# Patient Record
Sex: Male | Born: 1945
Health system: Southern US, Community
[De-identification: ages and names within clinical notes are randomized; demographics above are authoritative.]

## PROBLEM LIST (undated history)

## (undated) DIAGNOSIS — Z8619 Personal history of other infectious and parasitic diseases: Secondary | ICD-10-CM

## (undated) DIAGNOSIS — I6529 Occlusion and stenosis of unspecified carotid artery: Secondary | ICD-10-CM

## (undated) DIAGNOSIS — B029 Zoster without complications: Secondary | ICD-10-CM

## (undated) DIAGNOSIS — I2119 ST elevation (STEMI) myocardial infarction involving other coronary artery of inferior wall: Secondary | ICD-10-CM

## (undated) DIAGNOSIS — C61 Malignant neoplasm of prostate: Secondary | ICD-10-CM

## (undated) DIAGNOSIS — I350 Nonrheumatic aortic (valve) stenosis: Secondary | ICD-10-CM

## (undated) DIAGNOSIS — I739 Peripheral vascular disease, unspecified: Secondary | ICD-10-CM

## (undated) DIAGNOSIS — I10 Essential (primary) hypertension: Secondary | ICD-10-CM

## (undated) DIAGNOSIS — C801 Malignant (primary) neoplasm, unspecified: Secondary | ICD-10-CM

## (undated) DIAGNOSIS — I2510 Atherosclerotic heart disease of native coronary artery without angina pectoris: Secondary | ICD-10-CM

## (undated) DIAGNOSIS — L989 Disorder of the skin and subcutaneous tissue, unspecified: Secondary | ICD-10-CM

## (undated) DIAGNOSIS — E785 Hyperlipidemia, unspecified: Secondary | ICD-10-CM

## (undated) DIAGNOSIS — I6350 Cerebral infarction due to unspecified occlusion or stenosis of unspecified cerebral artery: Secondary | ICD-10-CM

## (undated) DIAGNOSIS — I779 Disorder of arteries and arterioles, unspecified: Secondary | ICD-10-CM

## (undated) DIAGNOSIS — C44321 Squamous cell carcinoma of skin of nose: Secondary | ICD-10-CM

## (undated) HISTORY — PX: MOHS SURGERY: SUR867

## (undated) HISTORY — DX: ST elevation (STEMI) myocardial infarction involving other coronary artery of inferior wall: I21.19

## (undated) HISTORY — DX: Disorder of the skin and subcutaneous tissue, unspecified: L98.9

## (undated) HISTORY — DX: Malignant neoplasm of prostate: C61

## (undated) HISTORY — DX: Cerebral infarction due to unspecified occlusion or stenosis of unspecified cerebral artery: I63.50

## (undated) HISTORY — DX: Malignant (primary) neoplasm, unspecified: C80.1

## (undated) HISTORY — DX: Peripheral vascular disease, unspecified: I73.9

## (undated) HISTORY — DX: Atherosclerotic heart disease of native coronary artery without angina pectoris: I25.10

## (undated) HISTORY — DX: Occlusion and stenosis of unspecified carotid artery: I65.29

## (undated) HISTORY — DX: Hyperlipidemia, unspecified: E78.5

## (undated) HISTORY — DX: Disorder of arteries and arterioles, unspecified: I77.9

## (undated) HISTORY — DX: Essential (primary) hypertension: I10

## (undated) HISTORY — DX: Squamous cell carcinoma of skin of nose: C44.321

## (undated) HISTORY — DX: Nonrheumatic aortic (valve) stenosis: I35.0

## (undated) HISTORY — PX: SKIN BIOPSY: SHX1

## (undated) HISTORY — DX: Personal history of other infectious and parasitic diseases: Z86.19

## (undated) HISTORY — DX: Zoster without complications: B02.9

## (undated) HISTORY — PX: HERNIA REPAIR: SHX51

---

## 1993-04-16 DIAGNOSIS — I2119 ST elevation (STEMI) myocardial infarction involving other coronary artery of inferior wall: Secondary | ICD-10-CM

## 1993-04-16 HISTORY — DX: ST elevation (STEMI) myocardial infarction involving other coronary artery of inferior wall: I21.19

## 2004-12-17 DIAGNOSIS — I6350 Cerebral infarction due to unspecified occlusion or stenosis of unspecified cerebral artery: Secondary | ICD-10-CM

## 2004-12-17 HISTORY — DX: Cerebral infarction due to unspecified occlusion or stenosis of unspecified cerebral artery: I63.50

## 2006-07-05 ENCOUNTER — Ambulatory Visit: Payer: Self-pay | Admitting: Cardiology

## 2006-07-11 ENCOUNTER — Ambulatory Visit: Payer: Self-pay | Admitting: Cardiology

## 2006-08-23 ENCOUNTER — Ambulatory Visit: Payer: Self-pay | Admitting: Cardiology

## 2006-09-11 ENCOUNTER — Ambulatory Visit: Payer: Self-pay | Admitting: Cardiology

## 2006-10-01 ENCOUNTER — Ambulatory Visit: Payer: Self-pay

## 2006-10-24 ENCOUNTER — Ambulatory Visit: Payer: Self-pay | Admitting: Cardiology

## 2007-10-09 ENCOUNTER — Ambulatory Visit: Payer: Self-pay | Admitting: Cardiology

## 2008-03-09 ENCOUNTER — Ambulatory Visit: Payer: Self-pay | Admitting: Cardiology

## 2008-11-04 ENCOUNTER — Ambulatory Visit: Payer: Self-pay | Admitting: Cardiology

## 2008-11-04 ENCOUNTER — Ambulatory Visit: Payer: Self-pay | Admitting: Cardiovascular Disease

## 2008-11-04 LAB — CONVERTED CEMR LAB
AST: 29 units/L (ref 0–37)
CO2: 20 meq/L (ref 19–32)
Chloride: 99 meq/L (ref 96–112)
Glucose, Bld: 107 mg/dL — ABNORMAL HIGH (ref 70–99)
HDL: 65 mg/dL (ref >39)
LDL Cholesterol: 101 mg/dL — ABNORMAL HIGH (ref 0–99)
Sodium: 136 meq/L (ref 135–145)
Total CHOL/HDL Ratio: 2.8
VLDL: 18 mg/dL (ref 0–40)

## 2008-11-09 ENCOUNTER — Ambulatory Visit: Payer: Self-pay | Admitting: Cardiology

## 2009-05-12 DIAGNOSIS — G20 Parkinson's disease: Secondary | ICD-10-CM | POA: Insufficient documentation

## 2009-06-14 ENCOUNTER — Ambulatory Visit: Payer: Self-pay | Admitting: Cardiology

## 2009-06-14 ENCOUNTER — Encounter: Payer: Self-pay | Admitting: Cardiology

## 2009-06-14 DIAGNOSIS — I6529 Occlusion and stenosis of unspecified carotid artery: Secondary | ICD-10-CM | POA: Insufficient documentation

## 2009-07-12 ENCOUNTER — Ambulatory Visit: Payer: Self-pay

## 2009-07-13 ENCOUNTER — Encounter: Payer: Self-pay | Admitting: Cardiology

## 2009-07-19 ENCOUNTER — Encounter: Payer: Self-pay | Admitting: Cardiology

## 2010-02-16 DIAGNOSIS — I10 Essential (primary) hypertension: Secondary | ICD-10-CM | POA: Insufficient documentation

## 2010-02-16 DIAGNOSIS — I252 Old myocardial infarction: Secondary | ICD-10-CM | POA: Insufficient documentation

## 2010-02-16 DIAGNOSIS — I2510 Atherosclerotic heart disease of native coronary artery without angina pectoris: Secondary | ICD-10-CM | POA: Insufficient documentation

## 2010-02-16 DIAGNOSIS — E785 Hyperlipidemia, unspecified: Secondary | ICD-10-CM | POA: Insufficient documentation

## 2010-02-21 ENCOUNTER — Ambulatory Visit: Payer: Self-pay | Admitting: Cardiology

## 2010-02-21 DIAGNOSIS — R635 Abnormal weight gain: Secondary | ICD-10-CM | POA: Insufficient documentation

## 2010-02-27 ENCOUNTER — Encounter: Payer: Self-pay | Admitting: Cardiology

## 2010-04-17 ENCOUNTER — Telehealth (INDEPENDENT_AMBULATORY_CARE_PROVIDER_SITE_OTHER): Payer: Self-pay | Admitting: *Deleted

## 2010-04-20 ENCOUNTER — Ambulatory Visit: Payer: Self-pay | Admitting: Cardiology

## 2010-04-26 LAB — CONVERTED CEMR LAB
ALT: 31 units/L (ref 0–53)
Alkaline Phosphatase: 50 units/L (ref 39–117)
LDL Cholesterol: 111 mg/dL — ABNORMAL HIGH (ref 0–99)
Sodium: 138 meq/L (ref 135–145)
Total Bilirubin: 2.2 mg/dL — ABNORMAL HIGH (ref 0.3–1.2)
Total Protein: 7.4 g/dL (ref 6.0–8.3)
Triglycerides: 78 mg/dL (ref <150)
VLDL: 16 mg/dL (ref 0–40)

## 2010-10-02 ENCOUNTER — Ambulatory Visit: Payer: Self-pay

## 2010-10-02 ENCOUNTER — Ambulatory Visit: Payer: Self-pay | Admitting: Cardiology

## 2010-10-02 DIAGNOSIS — I739 Peripheral vascular disease, unspecified: Secondary | ICD-10-CM | POA: Insufficient documentation

## 2010-10-26 ENCOUNTER — Telehealth (INDEPENDENT_AMBULATORY_CARE_PROVIDER_SITE_OTHER): Payer: Self-pay | Admitting: Radiology

## 2010-10-30 ENCOUNTER — Ambulatory Visit: Payer: Self-pay

## 2010-10-30 ENCOUNTER — Encounter (HOSPITAL_COMMUNITY)
Admission: RE | Admit: 2010-10-30 | Discharge: 2010-12-16 | Payer: Self-pay | Source: Home / Self Care | Attending: Cardiology | Admitting: Cardiology

## 2010-10-30 ENCOUNTER — Encounter: Payer: Self-pay | Admitting: Cardiology

## 2010-10-30 ENCOUNTER — Ambulatory Visit: Payer: Self-pay | Admitting: Cardiology

## 2010-12-21 ENCOUNTER — Encounter: Payer: Self-pay | Admitting: Cardiology

## 2011-01-16 NOTE — Assessment & Plan Note (Signed)
Summary: Cardiology Nuclear Testing  Nuclear Med Background Indications for Stress Test: Evaluation for Ischemia   History: GXT, Heart Catheterization, Myocardial Infarction, Myocardial Perfusion Study  History Comments: '94 MI-IWMI / Cath- no CAD , '07 GXT (-) , '09 MPI - nml  Symptoms: Palpitations    Nuclear Pre-Procedure Cardiac Risk Factors: Carotid Disease, Family History - CAD, Hypertension, Lipids, PVD Caffeine/Decaff Intake: none NPO After: 7:00 PM Lungs: clear IV 0.9% NS with Angio Cath: 22g     IV Site: R Hand IV Started by: Cathlyn Parsons, RN Chest Size (in) 42     Height (in): 69 Weight (lb): 192 BMI: 28.46  Nuclear Med Study 1 or 2 day study:  1 day     Stress Test Type:  Stress Reading MD:  Marca Ancona, MD     Referring MD:  T.Wall Resting Radionuclide:  Technetium 84m Tetrofosmin     Resting Radionuclide Dose:  11.0 mCi  Stress Radionuclide:  Technetium 82m Tetrofosmin     Stress Radionuclide Dose:  33.0 mCi   Stress Protocol Exercise Time (min):  9:00 min     Max HR:  134 bpm     Predicted Max HR:  156 bpm  Max Systolic BP: 166 mm Hg     Percent Max HR:  85.90 %     METS: 10.4 Rate Pressure Product:  60454    Stress Test Technologist:  Milana Na, EMT-P     Nuclear Technologist:  Domenic Polite, CNMT  Rest Procedure  Myocardial perfusion imaging was performed at rest 45 minutes following the intravenous administration of Technetium 53m Tetrofosmin.  Stress Procedure  The patient exercised for 9:00. The patient stopped due to fatigue, sob, and denied any chest pain.  There were no significant ST-T wave changes and rare pacs/pvc.  Technetium 55m Tetrofosmin was injected at peak exercise and myocardial perfusion imaging was performed after a brief delay.  QPS Raw Data Images:  Normal; no motion artifact; normal heart/lung ratio. Stress Images:  Moderate basal to mid inferior perfusion defect.  Rest Images:  Moderate basal to mid inferior  perfusion defect.  Subtraction (SDS):  Fixed moderate basal to mid inferior perfusion defect.  Transient Ischemic Dilatation:  0.95  (Normal <1.22)  Lung/Heart Ratio:  0.25  (Normal <0.45)  Quantitative Gated Spect Images QGS EDV:  119 ml QGS ESV:  48 ml QGS EF:  60 % QGS cine images:  Basal to mid inferior severe hypokinesis.    Overall Impression  Exercise Capacity: Good exercise capacity. BP Response: Normal blood pressure response. Clinical Symptoms: Short of breath, no chest pain.  ECG Impression: No significant ST segment change suggestive of ischemia. Overall Impression: Findings suggestive of prior inferior MI with basal to mid fixed inferior perfusion defect.  No ischemia.  Overall Impression Comments: Preserved EF.   Appended Document: Cardiology Nuclear Testing stable, no change in treatment.  Reviewed Juanito Doom, MD  Appended Document: Cardiology Nuclear Testing Pt aware of results. Mylo Red RN

## 2011-01-16 NOTE — Miscellaneous (Signed)
Summary: Orders Update  Clinical Lists Changes  Orders: Added new Test order of Carotid Duplex (Carotid Duplex) - Signed 

## 2011-01-16 NOTE — Letter (Signed)
Summary: [facility] Office Note  [facility] Office Note   Imported By: Roderic Ovens 03/22/2010 12:38:15  _____________________________________________________________________  External Attachment:    Type:   Image     Comment:   External Document

## 2011-01-16 NOTE — Assessment & Plan Note (Signed)
Summary: 6 MONTH ROV WITH CAROTID U/S SAMEDAY  Medications Added CARBIDOPA-LEVODOPA CR 25-100 MG CR-TABS (CARBIDOPA-LEVODOPA) 1/2 tab six times daily AZILECT 1 MG TABS (RASAGILINE MESYLATE) 1 tab once daily        Visit Type:  6 mo f/u  Primary Provider:  No PCP at this time  CC:  edema/ankles....denies any other complaints today.....Marland Kitchen  History of Present Illness: [name] [name] returns today for further evaluation and management of his multiple cardiac and vascular issues.  He's having no angina or ischemic symptoms. Mobility is limited because of his Parkinson's. Last stress evaluation was in 2009.  He's having no symptoms of TIAs or mini strokes. He is to have carotid Dopplers today.  He has had about 10-12 pound weight gain. He denies orthopnea PND or edema. 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  Blood work reviewed from last visit. Meds reviewed. He is compliant with these.  He denies any claudication.  Clinical Reports Reviewed:  Nuclear Study:  11/04/2008:  This is a negative exercise Myoview for ischemia. There was no evidence for ischemia on the perfusion imaging. There were no ischemic ST segment changes noted on exercise. The patient demonstrated good exercise tolerance.  10/01/2006:  Exercise capacity - Good exercise capacity  Blood Pressure - Normal BP response  Clinical Symptoms - No chest pain or dyspnea  ECG impression - No significant ST segement change suggestive of ischemia  Overall impression -  There is an old small/moderate inferolateral scar with slight peri-infarct ischemia  Carotid Doppler:  07/12/2009:  Impressions: Moderate hetergenous plaque on the right, and mild on the left. 60-79% RICA stenosis, low end of range. 40-59% LICA stenosis.  [name] Bollman, MD   Current Medications (verified): 1)  Aspirin Ec 325 Mg Tbec (Aspirin) .... Take One Tablet By Mouth Daily 2)  Fish Oil 1200 Mg Caps (Omega-3 Fatty Acids) ....  2 By Mouth Daily 3)  Lipitor 80 Mg Tabs (Atorvastatin Calcium) .... Take One Tablet By Mouth Daily. 4)  Amlodipine Besylate 10 Mg Tabs (Amlodipine Besylate) .... Take One Tablet By Mouth Daily 5)  Benazepril Hcl 40 Mg Tabs (Benazepril Hcl) .Marland Kitchen.. 1 By Mouth Once Daily 6)  Requip 3 Mg Tabs (Ropinirole Hcl) .... 1/2 Tab Six Times Daily 7)  Carbidopa-Levodopa Cr 25-100 Mg Cr-Tabs (Carbidopa-Levodopa) .... 1/2 Tab Six Times Daily 8)  Azilect 1 Mg Tabs (Rasagiline Mesylate) .Marland Kitchen.. 1 Tab Once Daily  Allergies: 1)  ! Pcn  Past History:  Past Medical History: Last updated: 02/16/2010 MYOCARDIAL INFARCTION, ACUTE, INFERIOR [name] [name] (ICD-410.40) CVA/ 2006 (ICD-434.91) CAD, NATIVE VESSEL (ICD-414.01) CAROTID ARTERY STENOSIS, WITHOUT INFARCTION (ICD-433.10) HYPERLIPIDEMIA, SEVERE (ICD-272.4) HYPERTENSION (ICD-401.9) PARKINSON'S DISEASE (ICD-332.0)    Family History: Last updated: 05/12/2009 Heart disease - unknown  Social History: Last updated: 05/12/2009 Full Time Married  Tobacco Use - No.  Alcohol Use - yes Regular Exercise - yes Drug Use - no  Risk Factors: Exercise: yes (05/12/2009)  Risk Factors: Smoking Status: never (05/12/2009)  Review of Systems       negative other than history of present illness  Vital Signs:  Patient profile:   65 year old male Height:      69 inches Weight:      197.12 pounds BMI:     29.21 BP sitting:   124 / 80  (right arm) Cuff size:   large  Vitals Entered By: [name] [name], CMA (October 02, 2010 10:24 AM)  Physical Exam  General:  Well developed, well  nourished, in no acute distress. Head:  normocephalic and atraumatic Eyes:  PERRLA/EOM intact; conjunctiva and lids normal. Neck:  Neck supple, no JVD. No masses, thyromegaly or abnormal cervical nodes. Chest [name] [name]:  no deformities or breast masses noted Lungs:  Clear bilaterally to auscultation and percussion. Heart:  PMI not displaced, normal S1-S2, no gallop. Systolic murmur at the apex  at the left sternal border. S2 splits. No diastolic murmur. Carotid bruits bilaterally right greater than left. Abdomen:  positive bowel sounds, no midline bruit, midline hernia, no obvious organomegaly or Msk:  decreased ROM.  Parkinsonian features Pulses:  diminished in the left lower extremity with trace dorsalis pedis 1+ posterior tibial Extremities:  no tissue breakdown or ulcerations. Trace edema. Neurologic:  alert oriented x3, Parkinson's features Skin:  Intact without lesions or rashes. Psych:  Normal affect.   Impression & Recommendations:  Problem # 1:  CAD, NATIVE VESSEL (ICD-414.01)  I will arrange a followup exercise Myoview for risk stratification. Last study 2009. His updated medication list for this problem includes:    Aspirin Ec 325 Mg Tbec (Aspirin) .Marland Kitchen... Take one tablet by mouth daily    Amlodipine Besylate 10 Mg Tabs (Amlodipine besylate) .Marland Kitchen... Take one tablet by mouth daily    Benazepril Hcl 40 Mg Tabs (Benazepril hcl) .Marland Kitchen... 1 by mouth once daily  His updated medication list for this problem includes:    Aspirin Ec 325 Mg Tbec (Aspirin) .Marland Kitchen... Take one tablet by mouth daily    Amlodipine Besylate 10 Mg Tabs (Amlodipine besylate) .Marland Kitchen... Take one tablet by mouth daily    Benazepril Hcl 40 Mg Tabs (Benazepril hcl) .Marland Kitchen... 1 by mouth once daily  Orders: EKG w/ Interpretation (93000) Nuclear Stress Test (Nuc Stress Test)  Problem # 2:  MYOCARDIAL INFARCTION, ACUTE, INFERIOR [name] [name] (ICD-410.40) Assessment: Unchanged  His updated medication list for this problem includes:    Aspirin Ec 325 Mg Tbec (Aspirin) .Marland Kitchen... Take one tablet by mouth daily    Amlodipine Besylate 10 Mg Tabs (Amlodipine besylate) .Marland Kitchen... Take one tablet by mouth daily    Benazepril Hcl 40 Mg Tabs (Benazepril hcl) .Marland Kitchen... 1 by mouth once daily  His updated medication list for this problem includes:    Aspirin Ec 325 Mg Tbec (Aspirin) .Marland Kitchen... Take one tablet by mouth daily    Amlodipine Besylate 10 Mg Tabs  (Amlodipine besylate) .Marland Kitchen... Take one tablet by mouth daily    Benazepril Hcl 40 Mg Tabs (Benazepril hcl) .Marland Kitchen... 1 by mouth once daily  Problem # 3:  CVA/ 2006 (ICD-434.91) Assessment: Unchanged carotid Dopplers today His updated medication list for this problem includes:    Aspirin Ec 325 Mg Tbec (Aspirin) .Marland Kitchen... Take one tablet by mouth daily  Problem # 4:  WEIGHT GAIN (ICD-783.1) Assessment: Deteriorated advised to lose 10 pounds.  Problem # 5:  PVD (ICD-443.9) Assessment: New pulses diminished but present in the left lower extremity. Asymptomatic. Observe. Patient advised about claudication symptoms  Problem # 6:  CAROTID ARTERY STENOSIS, WITHOUT INFARCTION (ICD-433.10) Dopplers today His updated medication list for this problem includes:    Aspirin Ec 325 Mg Tbec (Aspirin) .Marland Kitchen... Take one tablet by mouth daily  Problem # 7:  HYPERTENSION (ICD-401.9) Assessment: Improved no change in meds His updated medication list for this problem includes:    Aspirin Ec 325 Mg Tbec (Aspirin) .Marland Kitchen... Take one tablet by mouth daily    Amlodipine Besylate 10 Mg Tabs (Amlodipine besylate) .Marland Kitchen... Take one tablet by mouth daily    Benazepril  Hcl 40 Mg Tabs (Benazepril hcl) .Marland Kitchen... 1 by mouth once daily  Problem # 8:  HYPERLIPIDEMIA, SEVERE (ICD-272.4) LDL is not at goal but HDL is very high. Doubt benefit from adding another drug at this point. His updated medication list for this problem includes:    Lipitor 80 Mg Tabs (Atorvastatin calcium) .Marland Kitchen... Take one tablet by mouth daily.  Orders: EKG w/ Interpretation (93000) Nuclear Stress Test (Nuc Stress Test)  Patient Instructions: 1)  Your physician recommends that you schedule a follow-up appointment in: 6 months with Dr. Daleen Squibb 2)  Your physician recommends that you continue on your current medications as directed. Please refer to the Current Medication list given to you today. 3)  Your physician has requested that you have an exercise stress myoview.   For further information please visit [email or web address]  Please follow instruction sheet, as given.

## 2011-01-16 NOTE — Assessment & Plan Note (Signed)
Summary: rov/ gd  Medications Added REQUIP 3 MG TABS (ROPINIROLE HCL) 1/2 tab six times daily        Visit Type:  rov Primary Provider:  No PCP at this time  CC:  pt states he is changing PCP .Marland Kitchenno cardiac complaints today.  History of Present Illness: [name] [name] comes in today for further management his coronary artery disease, history of an inferior Dawan Farney infarct, overall normal left ventricular function, carotid artery disease with a history of a stroke, hypertension, mixed hyperlipidemia.  He is having no symptoms of angina or ischemic equivalence. He has been much less active because of worsening of his Parkinson's. He is scheduled to see Dr. Sandria Manly and late May.  Carotid Dopplers in July 2010 showed 60-79% right internal carotid artery stenosis, 40-59% on the left. He had good antegrade flow in both vertebrals. He is having no symptoms of mini strokes or TIAs.  He seems to be compliant with his medications. His weight is up from being less active.  Current Medications (verified): 1)  Aspirin Ec 325 Mg Tbec (Aspirin) .... Take One Tablet By Mouth Daily 2)  Fish Oil 1200 Mg Caps (Omega-3 Fatty Acids) .... 2 By Mouth Daily 3)  Lipitor 80 Mg Tabs (Atorvastatin Calcium) .... Take One Tablet By Mouth Daily. 4)  Amlodipine Besylate 10 Mg Tabs (Amlodipine Besylate) .... Take One Tablet By Mouth Daily 5)  Benazepril Hcl 40 Mg Tabs (Benazepril Hcl) .Marland Kitchen.. 1 By Mouth Once Daily 6)  Requip 3 Mg Tabs (Ropinirole Hcl) .... 1/2 Tab Six Times Daily  Allergies: 1)  ! Pcn  Past History:  Past Medical History: Last updated: 02/16/2010 MYOCARDIAL INFARCTION, ACUTE, INFERIOR Evva Din (ICD-410.40) CVA/ 2006 (ICD-434.91) CAD, NATIVE VESSEL (ICD-414.01) CAROTID ARTERY STENOSIS, WITHOUT INFARCTION (ICD-433.10) HYPERLIPIDEMIA, SEVERE (ICD-272.4) HYPERTENSION (ICD-401.9) PARKINSON'S DISEASE (ICD-332.0)    Family History: Last updated: 05/12/2009 Heart disease - unknown  Social History: Last updated:  05/12/2009 Full Time Married  Tobacco Use - No.  Alcohol Use - yes Regular Exercise - yes Drug Use - no  Risk Factors: Exercise: yes (05/12/2009)  Risk Factors: Smoking Status: never (05/12/2009)  Review of Systems       negative other than history of present illness  Vital Signs:  Patient profile:   65 year old male Height:      69 inches Weight:      195 pounds BMI:     28.90 Pulse rate:   80 / minute Pulse rhythm:   irregular BP sitting:   132 / 80  (right arm) Cuff size:   large  Vitals Entered By: Danielle Rankin, CMA (February 21, 2010 10:29 AM)  Physical Exam  General:  ruddy complexion which is baseline, no acute distress, obvious resting tremor left upper extremity worse in left lower extremity. Head:  normocephalic and atraumatic Eyes:  PERRLA/EOM intact; conjunctiva and lids normal. Neck:  Neck supple, no JVD. No masses, thyromegaly or abnormal cervical nodes. Chest Miriah Maruyama:  no deformities or breast masses noted Lungs:  Clear bilaterally to auscultation and percussion. Heart:  nondisplaced PMI, normal S1-S2, no gallop or murmur. Abdomen:  Bowel sounds positive; abdomen soft and non-tender without masses, organomegaly, or hernias noted. No hepatosplenomegaly. Msk:  decreased ROM.  decreased ROM.   Pulses:  pulses normal in all 4 extremities Extremities:  No clubbing or cyanosis. Neurologic:  Alert and oriented x 3. Parkinson's has clearly worsened Skin:  Intact without lesions or rashes. Psych:  depressed affect.  depressed affect.  Impression & Recommendations:  Problem # 1:  CAD, NATIVE VESSEL (ICD-414.01) Assessment Unchanged  His updated medication list for this problem includes:    Aspirin Ec 325 Mg Tbec (Aspirin) .Marland Kitchen... Take one tablet by mouth daily    Amlodipine Besylate 10 Mg Tabs (Amlodipine besylate) .Marland Kitchen... Take one tablet by mouth daily    Benazepril Hcl 40 Mg Tabs (Benazepril hcl) .Marland Kitchen... 1 by mouth once daily  His updated medication list for  this problem includes:    Aspirin Ec 325 Mg Tbec (Aspirin) .Marland Kitchen... Take one tablet by mouth daily    Amlodipine Besylate 10 Mg Tabs (Amlodipine besylate) .Marland Kitchen... Take one tablet by mouth daily    Benazepril Hcl 40 Mg Tabs (Benazepril hcl) .Marland Kitchen... 1 by mouth once daily  Orders: EKG w/ Interpretation (93000)  Problem # 2:  MYOCARDIAL INFARCTION, ACUTE, INFERIOR Aidin Doane (ICD-410.40) Assessment: Unchanged  His updated medication list for this problem includes:    Aspirin Ec 325 Mg Tbec (Aspirin) .Marland Kitchen... Take one tablet by mouth daily    Amlodipine Besylate 10 Mg Tabs (Amlodipine besylate) .Marland Kitchen... Take one tablet by mouth daily    Benazepril Hcl 40 Mg Tabs (Benazepril hcl) .Marland Kitchen... 1 by mouth once daily  His updated medication list for this problem includes:    Aspirin Ec 325 Mg Tbec (Aspirin) .Marland Kitchen... Take one tablet by mouth daily    Amlodipine Besylate 10 Mg Tabs (Amlodipine besylate) .Marland Kitchen... Take one tablet by mouth daily    Benazepril Hcl 40 Mg Tabs (Benazepril hcl) .Marland Kitchen... 1 by mouth once daily  Problem # 3:  CVA/ 2006 (ICD-434.91) Assessment: Unchanged  His updated medication list for this problem includes:    Aspirin Ec 325 Mg Tbec (Aspirin) .Marland Kitchen... Take one tablet by mouth daily  His updated medication list for this problem includes:    Aspirin Ec 325 Mg Tbec (Aspirin) .Marland Kitchen... Take one tablet by mouth daily  Problem # 4:  CAROTID ARTERY STENOSIS, WITHOUT INFARCTION (ICD-433.10) Assessment: Unchanged Carotids reviewed from July 2010. We will repeat those in 6 months. He is asymptomatic. His updated medication list for this problem includes:    Aspirin Ec 325 Mg Tbec (Aspirin) .Marland Kitchen... Take one tablet by mouth daily  Orders: Carotid Duplex (Carotid Duplex)  Problem # 5:  HYPERLIPIDEMIA, SEVERE (ICD-272.4) He is due fasting lipids and a comprehensive metabolic panel. We will also check a hemoglobin A1c with his recent weight gain. His updated medication list for this problem includes:    Lipitor 80 Mg  Tabs (Atorvastatin calcium) .Marland Kitchen... Take one tablet by mouth daily.  Problem # 6:  HYPERTENSION (ICD-401.9) Assessment: Improved  His updated medication list for this problem includes:    Aspirin Ec 325 Mg Tbec (Aspirin) .Marland Kitchen... Take one tablet by mouth daily    Amlodipine Besylate 10 Mg Tabs (Amlodipine besylate) .Marland Kitchen... Take one tablet by mouth daily    Benazepril Hcl 40 Mg Tabs (Benazepril hcl) .Marland Kitchen... 1 by mouth once daily    His updated medication list for this problem includes:    Aspirin Ec 325 Mg Tbec (Aspirin) .Marland Kitchen... Take one tablet by mouth daily    Amlodipine Besylate 10 Mg Tabs (Amlodipine besylate) .Marland Kitchen... Take one tablet by mouth daily    Benazepril Hcl 40 Mg Tabs (Benazepril hcl) .Marland Kitchen... 1 by mouth once daily  Problem # 7:  PARKINSON'S DISEASE (ICD-332.0) Assessment: Deteriorated This has clearly worsened. I have called Dr. Sandria Manly of neurology. He will see him sooner than his scheduled appointment in May.  Patient  Instructions: 1)  Your physician recommends that you schedule a follow-up appointment in: 6 MONTHS WITH DR Susy Placzek CAROTID SAME DAY 2)  Your physician recommends that you return for lab work ZO:XWRUE BMET LIPID LIVER HGBA1C 3)  Your physician recommends that you continue on your current medications as directed. Please refer to the Current Medication list given to you today. 4)  Your physician has requested that you have a carotid duplex. This test is an ultrasound of the carotid arteries in your neck. It looks at blood flow through these arteries that supply the brain with blood. Allow one hour for this exam. There are no restrictions or special instructions.

## 2011-01-16 NOTE — Progress Notes (Signed)
Summary: nuc pre-procedure  Phone Note Outgoing Call   Call placed by: Domenic Polite, CNMT,  October 26, 2010 2:40 PM Call placed to: Patient Summary of Call: Left message with information on Myoview Information Sheet (see scanned document for details).  Initial call taken by: Domenic Polite, CNMT,  October 26, 2010 2:40 PM     Nuclear Med Background Indications for Stress Test: Evaluation for Ischemia   History: GXT, Heart Catheterization, Myocardial Infarction, Myocardial Perfusion Study  History Comments: '94 MI-IWMI / Cath- no CAD , '07 GXT (-) , '09 MPI - nml     Nuclear Pre-Procedure Cardiac Risk Factors: Carotid Disease, Family History - CAD, Hypertension, Lipids, PVD Height (in): 69

## 2011-01-16 NOTE — Progress Notes (Signed)
   Phone Note Outgoing Call   Call placed by: Scherrie Bateman, LPN,  Apr 18, 4539 4:34 PM Call placed to: Patient'S WIFE Summary of Call: LEFT WORD  WITH WIFE PT NEEDS LABS. SCHEDULED FOR THURS.5//5/11 AT 8:30 Initial call taken by: Scherrie Bateman, LPN,  Apr 17, 9810 4:36 PM  Follow-up for Phone Call        LABS  DONE AS SCHEDULED. Follow-up by: Scherrie Bateman, LPN,  Apr 26, 2010 9:09 AM

## 2011-01-18 NOTE — Letter (Signed)
Summary: [facility] Office Visit Note  [facility] Office Visit Note   Imported By: Roderic Ovens 01/08/2011 14:32:52  _____________________________________________________________________  External Attachment:    Type:   Image     Comment:   External Document

## 2011-05-01 NOTE — Assessment & Plan Note (Signed)
[facility] OFFICE NOTE   NAME:Venier, KIMOTHY KISHIMOTO                         MRN:          [id number]  DATE:03/09/2008                            DOB:          [birth year]    Maxmilian comes in today because of increased tremor on his left side.  This  is the part of his body that was affected by a stroke in 2006.  He has  had some slight rigidity and intention tremor there in the past, but has  been able to work through it and play golf.   His cardiac situation is stable.  He is due a stress test and blood work  in October.   MEDICATIONS:  1. Lipitor 80 mg a day.  2. Aspirin 325 mg a day.  3. Lotrel 10/40 daily.  4. Omega-3 fatty acids.   He has had a very good response to diet and to his medications in  regards to his mixed hyperlipidemia.   PHYSICAL EXAMINATION:  VITAL SIGNS:  Blood pressure today is 126/72, his  pulse is 80 and regular, his weight is 185.  HEENT:  Unchanged.  HEART:  Regular rate and rhythm.  No gallop.  LUNGS:  Clear.  ABDOMEN:  Soft with good bowel sounds.  EXTREMITIES:  No edema.  He has some slight rigidity of the left upper  extremity and lower extremity.  He has a resting tremor that is quite  obvious in his left hand and less obvious in his left foot.  With  intentional movements, it is clearly worse.   ELECTROCARDIOGRAM:  EKG today is normal.   I put a call in to Dr. Melbourne Abts of [facility].  He is concerned  that he may have a hemiparetic Parkinson's syndrome.  This is very rare,  but that is what Rosanne Ashing is thinking.  He would like to evaluate him in his  office.  Will make arrangements.   FOLLOWUP:  I will plan on seeing Izen back in October.  At that time, he  will need fasting blood work and an exercise rest stress Myoview.     Thomas C. Daleen Squibb, MD, [facility]  Electronically Signed   TCW/MedQ  DD: 03/09/2008  DT: 03/09/2008  Job #: [id number]   cc:   Evie Lacks, MD

## 2011-05-01 NOTE — Assessment & Plan Note (Signed)
[facility] OFFICE NOTE   NAME:[name] [name]                         MRN:          [id number]  DATE:11/09/2008                            DOB:          07-08-1946    [name] [name] comes in today for followup.   He has been doing very well without any symptoms of ischemia or ischemic  equivalence.  His Parkinson syndrome is responding to ropinirole that  Dr. Sandria [name] is prescribing.  He is still teaching off.   His weight is up about 8-10 pounds and his wife who comes with him today  says he really needs to work on his diet.  He seems to be very compliant  with his medications, however.   MEDICATIONS:  1. Omega-3 1200 mg b.i.d.  2. Aspirin 325 mg a day.  3. Lipitor 80 mg a day.  4. Amlodipine 10 mg a day.  5. Ropinirole 2 mg t.i.d.  6. Benazepril 40 mg a day.   For his problem list, please refer to my previous notes.   PHYSICAL EXAMINATION:  VITAL SIGNS:  His blood pressure is 136/76, his  heart rate is 75 and regular, and his weight 193, up 8.  HEENT:  Unchanged.  Carotids are full.  Thyroid is not enlarged.  LUNGS:  Clear.  HEART:  Reveals a regular rate and rhythm.  EXTREMITIES:  Revealed no cyanosis, clubbing, or edema.  Pulses are  intact.   Stress Myoview showed him to exercise for 9-1/2 minutes reaching a peak  heart rate of 143 beats per minute.  This is 90% of predicted maximum  heart rate.  His blood pressure response was appropriate.  MET level 2  was 10.7.  There were no ischemic ST segment changes.   His Myoview images showed an EF of 71% with a subtle hypokinesia in the  inferior wall.  He had some scarring areas in the inferior and inferior  septal wall which are old.  There was no evidence of ischemia.   LABORATORY DATA:  Demonstrates a total cholesterol of 184; his  triglycerides were 91; HDL was increased to 65, recently 44; LDL is 101,  recently 212; and VLDL is 18.  Fasting blood sugar was  slightly elevated  at 107.  LFTs were normal except for a total bilirubin of 1.9.   EKG is stable.   I had a long talk with [name] [name] and his wife.  He clearly needs to  reduce his weight, to avoid glucose intolerance and diabetes, otherwise  his numbers look good.  I have advised him to lose about 8-10 pounds  over the next 6 months.  We will see him back at that time.     [name] C. Daleen Squibb, MD, [facility]  Electronically Signed    TCW/MedQ  DD: 11/09/2008  DT: 11/10/2008  Job #: [id number]

## 2011-05-01 NOTE — Assessment & Plan Note (Signed)
[facility] OFFICE NOTE   NAME:LEWISWalden, Statz                         MRN:          [id number]  DATE:10/09/2007                            DOB:          04-16-1946    Mr. Strnad returns today for follow up of the following issues:   1. Coronary artery disease, history of inferior wall infarct. He is      having no angina. His last stress Myoview was October 01, 2006 with      excellent exercise tolerance, EF 56%, minimal reversibility      inferior lateral wall with some scar.  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  2. Severe hyperlipidemia with excellent response to Lipitor and Omega-      3 fatty acids.  His total cholesterol is currently 187 down from      over 300, triglycerides are 102, HDL is increased from 44 to 63      with an additional 6 pounds of weight loss.  VLDL is normal, LDL is      104.  His LFTs are normal except for a slight increase in his      bilirubin which is probably Gilbert's syndrome.  3. Hypertension - under good control with Lotrel 10/40 daily.  He      checks at home on a regular basis, it is usually running around      120/80.  4. He has also had a stroke affecting the left side of his body.      Please refer to previous notes.   EXAMINATION:  VITAL SIGNS:  His blood pressure today was actually up a  little bit at 140/88.  He is a bit nervous.  He has a resting tremor in  his left upper extremity which is a residual of his stroke.  He says it  has not worsened.  His heart rate is 70 and regular.  His weight is 184,  down 6.  GENERAL:  Ruddy complexion which is baseline.  HEENT:  Otherwise unchanged.  NECK:  Carotid upstrokes were equal bilaterally without bruits, thyroid  is not enlarged, trachea is midline.  LUNGS:  Clear.  HEART:  Reveals a nondisplaced PMI, has a normal S1, S2 without murmur,  gallop or rub.  ABDOMEN:  Soft, no midline  bruit.  EXTREMITIES:  Reveal no clubbing, cyanosis or edema.  Pulses are intact.  NEURO EXAM:  Intact except for the left-sided residual changes.   EKG shows an old inferior wall infarct, sinus rhythm, no change from  before.   ASSESSMENT AND PLAN:  I am pleased with how Mr. Millican is doing.  I  thought at one point of adding Zetia to lower his LDL further however,  with the ambivalence concerning Zetia at the present time, not to  mention the fact that his total cholesterol and HDL ratio is less than  3, I have chosen not to do so.  I have made no changes in his medical  program.  If his tremor worsens we could consider perhaps beta blockade.  He says with tension however it is bad.  He may need neurological  consultation down the road.   I will see him back in a year.  At that time we will do a stress  Myoview.     Thomas C. Daleen Squibb, MD, [facility]  Electronically Signed    TCW/MedQ  DD: 10/09/2007  DT: 10/09/2007  Job #: [id number]

## 2011-05-04 NOTE — Assessment & Plan Note (Signed)
[facility]                              CARDIOLOGY OFFICE NOTE   [name] [name]                           MRN:          [id number]  DATE:07/11/2006                            DOB:          Oct 26, 1946    [name] [name] follows up today for blood pressure, as well as lab results.   Please see my note from 07/05/2006.   He is on Lotrel 1040 and aspirin 325 a day.  He is still taking his fish oil  and his red yeast rice.   His blood pressure have been remarkably better.  Today, it is 140/89, down  from 205/117.  Heart rate is 83.  He looks remarkably better and much less  plethora.  His wife even notices the difference.   His labs showed a cholesterol of 312, LDL of 218, HDL of 54, triglycerides  of 155.  His comprehensive metabolic panel is normal.  CBC is normal.  PSA  and TSH are normal.   The rest of the exam is unchanged.   PLAN:  1.  Lipitor 80 mg a day for reducing his LDL as low as possible.  Our goal      is 70 or less.  We may need to add Zetia 10.  This is both for      cardiovascular and cerebrovascular risk reduction.  2.  Followup blood work in six weeks with  a comprehensive metabolic panel      and lipid panel.  3.  See me back in 8 weeks.                               [name] C. Daleen Squibb, MD, [facility]    TCW/MedQ  DD:  07/11/2006  DT:  07/11/2006  Job #:  [id number]

## 2011-05-04 NOTE — Assessment & Plan Note (Signed)
[facility] OFFICE NOTE   NAME:[name], [name] SPIRITO                         MRN:          [id number]  DATE:10/24/2006                            DOB:          08-21-1946    [name] [name] comes in today to discuss the findings of his exercise stress Myoview  study.   He exercised for a total of 11 minutes reaching 13.7 METS. There were no ST  segment changes. He had some PVCs and PACs.   His EF is 56%, with decreased thickening of the inferior wall. Perfusion  scan demonstrated an old small moderate inferior lateral wall scar with very  minimal peri-infarct ischemia.   His lipids are at goal. He has had an excellent response to Lipitor 80. His  blood pressure is also at goal. He is exercising on a regular basis.   PHYSICAL EXAMINATION:  Blood pressure is 130/80, pulse is 76 and regular,  weight is 190. Rest of examination is unchanged.   ASSESSMENT/PLAN:  [name] [name] is doing remarkably well with secondary  preventative therapy. His stress nuclear study is low risk. I see no reason  for catheterization at this point.   He will continue with his current program. I will plan on seeing him back in  September 2008, for blood work and also in October for followup.     Thomas C. Daleen Squibb, MD, [facility]  Electronically Signed    TCW/MedQ  DD: 10/24/2006  DT: 10/24/2006  Job #: [id number]

## 2011-05-04 NOTE — Assessment & Plan Note (Signed)
[facility]                              CARDIOLOGY OFFICE NOTE   [name] [name], [name] [name]                           MRN:          [id number]  DATE:09/11/2006                            DOB:          1946-11-20    [name] [name] comes in today for followup of his coronary artery disease,  hypertension, and hyperlipidemia.   He has become extremely compliant.  He is exercising, sometimes 2 times a  day walking on his treadmill.  He has lost about 9 pounds of weight.  His  blood pressure has been under good control and he seems very compliant with  his medications.  His lipid status was remarkably improved on Lipitor 80,  going from 312 total to 155.  HDL is still acceptable at 44.3 and LDL is  down from 218 to 96.  LFTs were okay.   EXAM:  His blood pressure is 135/81.  His pulse is 86 and regular.  His skin color looks much better.  Carotids are full without bruits.  There is no JVD.  Thyroid is not  enlarged.  Trachea is midline.  HEART:  Regular rate and rhythm without murmur, rub, or gallop.  EXTREMITIES:  No edema.  Pulses are intact.   ASSESSMENT AND PLAN:  [name] [name] is doing remarkably well.  His relative risk for  future stroke or heart attack has been dramatically reduced by secondary  prevention measures, including him buying into being healthy.  His wife is  extremely supportive, as always.   Now that his blood pressure is under control and he is doing better we will  set him up for an exercise rest/stress Myoview in October.  If this is  negative for ischemia, we will continue current treatment and see him back  in 6 months.  Asked him to stop his red yeast rice since he is on Lipitor.  He will continue Lotrel 10/40 and aspirin 325 a day, and fish oil.  I will  not add Zetia at present because of financial reasons.       Thomas C. Daleen Squibb, MD, [facility]     TCW/MedQ  DD:  09/11/2006  DT:  09/12/2006  Job #:  [id number]

## 2011-05-04 NOTE — Assessment & Plan Note (Signed)
[facility]                              CARDIOLOGY OFFICE NOTE   COHEN, BOETTNER                           MRN:          [id number]  DATE:07/05/2006                            DOB:          1946-04-17    This is a new patient evaluation.   [name] is a 65 year old married white male, self-referred from  friends in [place].   [name] [name] experienced a heart attack on May 03, 1993 while playing golf down  in [place].  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   He was told at that time that a small vessel on the back of his heart was  blocked. There was nothing to do.   He has had no further cardiovascular events since then.  However, right  before Christmas this past year, he and his wife noticed that he was having  difficulty using his left arm and left leg.  He has now developed some  intention tremor and some rigidity there.  His strength is good.  His still  able to play golf.   He has not been to a physician in nine years.  He did have a treadmill study  at [facility] with Dr. Cindra Presume in 1998.  At that time he had  excellent exercise tolerance.  EKG could not be interpreted because of  previous Q waves but he had no new ST segment changes.  His blood pressure  response was hyperdynamic, increasing to 208/90.   PAST MEDICAL HISTORY:  In addition to the above, he is intolerant of  PENICILLIN.  He is not allergic to dye.   MEDICATIONS:  1.  He is currently taking omega-3 1200 b.i.d.  2.  Vitamin B complex.  3.  Vitamin C 1000 mg a day.  4.  Vitamin E 400 mg a day.  5.  Folic acid 800 mcg a day.  6.  Aspirin 81 mg b.i.d.  7.  Red yeast rice b.i.d.   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  He seems  extremely active.   FAMILY HISTORY:  His father had a heart attack and died at age 70.   SOCIAL HISTORY:  He has  three children.  He is married and lives in  [place].  He dropped his insurance this past year.  His wife is here  today and very loving and concerned about him.  She teaches elementary and  has insurance.  She can get him on her plan in September.   REVIEW OF SYSTEMS:  Negative other than a history of skin cancer.   PHYSICAL EXAMINATION:  GENERAL:  He is very pleasant.  VITAL SIGNS:  Blood pressure 205/117.  Pulse 82 and regular.  EKG shows  sinus rhythm with an old inferior wall infarct.  His weight is 199.  HEENT: He has a very ruddy complexion with spider veins in his face.  His  face on the  left is slightly flat.  It does not come up normally with a  smile.  PERRLA.  Extraocular movements intact.  Sclerae slightly injected.  Dentition is satisfactory.  NECK:  Carotids are full without bruits.  There is no JVD.  Thyroid is not  enlarged.  Trachea midline.  LUNGS:  Clear.  HEART:  Regular rate and rhythm with normal S1 and S2.  ABDOMEN:  Soft.  No midline bruit.  No hepatomegaly.  EXTREMITIES:  There is no cyanosis, clubbing or edema.  Pulses were present.  NEUROLOGIC:  Intention tremor on the left upper extremity.  He also has a  resting tremor in the left lower extremity.  His muscle tone is 5/5.  His  reflexes are increased.  He has normal hand grip.  His facial expression is  diminished on the left secondary to the stroke.   I failed to mention that he also drinks four glasses of wine a day.  He does  not smoke.   ASSESSMENT:  1.  History of arteriosclerotic coronary artery disease with an inferior      wall infarct in 1984.  He has been treated medically but has not been      compliant with his medications.  He is well aware of this as we      discussed today.  2.  Stroke sometime in late 2006.  He is blessed in that he has good      strength but has an intention tremor as well as some rigidity.  He is      still functional.  3.  Severe hypertension which is not being  treated.  4.  Unknown lipid status.  5.  Premature family history of coronary artery disease.   I have had a long talk, greater than 30 minutes, with [name] and his  wife.  He seems to be serious about his health and she is obviously  supportive.  He has agreed to do what we ask him to do and after September,  we can obtain some diagnostic studies.  There seems to be no urgency to this  at present.   PLAN:  1.  Increase aspirin to 325 a day.  2.  Discontinue alternative medications.  3.  Check baseline CMP, TSH, CBC, PSA, and lipid panel.  4.  Lotrel 10/40 once a day.  Samples given.  5.  Follow up with me next week to review blood work and follow up on blood      pressure.  He will check his blood pressure for [place] at home.                               Thomas C. Daleen Squibb, MD, [facility]    TCW/MedQ  DD:  07/05/2006  DT:  07/05/2006  Job #:  [id number]

## 2011-05-28 ENCOUNTER — Encounter: Payer: Self-pay | Admitting: Cardiology

## 2011-10-16 ENCOUNTER — Encounter: Payer: Self-pay | Admitting: *Deleted

## 2012-01-17 ENCOUNTER — Other Ambulatory Visit: Payer: Self-pay | Admitting: Cardiology

## 2012-01-30 ENCOUNTER — Other Ambulatory Visit: Payer: Self-pay | Admitting: Cardiology

## 2012-02-12 ENCOUNTER — Other Ambulatory Visit: Payer: Self-pay | Admitting: *Deleted

## 2012-02-12 MED ORDER — ATORVASTATIN CALCIUM 80 MG PO TABS: 80.0000 mg | ORAL_TABLET | Freq: Every day | ORAL | Status: DC

## 2012-02-12 MED ORDER — BENAZEPRIL HCL 40 MG PO TABS: 40.0000 mg | ORAL_TABLET | Freq: Every day | ORAL | Status: DC

## 2012-02-13 ENCOUNTER — Other Ambulatory Visit: Payer: Self-pay | Admitting: *Deleted

## 2012-03-03 ENCOUNTER — Other Ambulatory Visit: Payer: Self-pay | Admitting: Cardiology

## 2012-03-05 ENCOUNTER — Ambulatory Visit: Payer: Self-pay | Admitting: Cardiology

## 2012-03-05 ENCOUNTER — Encounter: Payer: Self-pay | Admitting: *Deleted

## 2012-03-11 ENCOUNTER — Ambulatory Visit (INDEPENDENT_AMBULATORY_CARE_PROVIDER_SITE_OTHER): Payer: Medicare Other | Admitting: Cardiology

## 2012-03-11 ENCOUNTER — Encounter: Payer: Self-pay | Admitting: Cardiology

## 2012-03-11 VITALS — BP 130/81 | HR 73 | Resp 18 | Ht 68.0 in | Wt 181.1 lb

## 2012-03-11 DIAGNOSIS — I10 Essential (primary) hypertension: Secondary | ICD-10-CM

## 2012-03-11 DIAGNOSIS — I6529 Occlusion and stenosis of unspecified carotid artery: Secondary | ICD-10-CM

## 2012-03-11 DIAGNOSIS — I739 Peripheral vascular disease, unspecified: Secondary | ICD-10-CM

## 2012-03-11 DIAGNOSIS — E785 Hyperlipidemia, unspecified: Secondary | ICD-10-CM | POA: Diagnosis not present

## 2012-03-11 DIAGNOSIS — I2510 Atherosclerotic heart disease of native coronary artery without angina pectoris: Secondary | ICD-10-CM | POA: Diagnosis not present

## 2012-03-11 DIAGNOSIS — G20 Parkinson's disease: Secondary | ICD-10-CM

## 2012-03-11 NOTE — Assessment & Plan Note (Signed)
Arrange fasting lipids. 

## 2012-03-11 NOTE — Assessment & Plan Note (Signed)
Arrange carotid Dopplers. 

## 2012-03-11 NOTE — Progress Notes (Signed)
HPI [name] [name] comes in today for evaluation and management of his history of coronary artery disease, old myocardial infarction which is remote and out of hospital, carotid are disease, hypertension, and mixed hyperlipidemia.  Examining no angina or ischemic symptoms. He denies symptoms of TIAs or mini strokes. He is compliant with his medications. He is up for carotid Dopplers and blood work.  He is having a hard time with his Parkinson's disease. He is still able to teach golf.  Past Medical History  Diagnosis Date  . Acute myocardial infarction of other inferior [name], episode of care unspecified   . Unspecified cerebral artery occlusion with cerebral infarction 2006    CVA  . Coronary atherosclerosis of native coronary artery   . Occlusion and stenosis of carotid artery without mention of cerebral infarction   . Other and unspecified hyperlipidemia     Severe  . Unspecified essential hypertension   . Parkinson's disease   . Peripheral vascular disease, unspecified     Current Outpatient Prescriptions  Medication Sig Dispense Refill  . amLODipine (NORVASC) 10 MG tablet TAKE 1 TABLET EVERY DAY  30 tablet  2  . aspirin 325 MG EC tablet Take 325 mg by mouth daily.        Marland Kitchen atorvastatin (LIPITOR) 80 MG tablet Take 1 tablet (80 mg total) by mouth daily.  30 tablet  3  . benazepril (LOTENSIN) 40 MG tablet TAKE 1 TABLET EVERY DAY  30 tablet  6  . carbidopa-levodopa (SINEMET CR) 25-100 MG per tablet Take 0.5 tablets by mouth 6 (six) times daily.        . Omega-3 Fatty Acids (FISH OIL) 1200 MG CAPS Take 2 capsules by mouth daily.        . Rasagiline Mesylate (AZILECT) 1 MG TABS Take 1 tablet by mouth daily.        Marland Kitchen rOPINIRole (REQUIP) 3 MG tablet Take 1.5 mg by mouth 6 (six) times daily.          Allergies  Allergen Reactions  . Penicillins     Family History  Problem Relation Age of Onset  . Heart disease Other     History   Social History  . Marital Status: Married    Spouse  Name: N/A    Number of Children: N/A  . Years of Education: N/A   Occupational History  . GOLF TEACHER     Full time   Social History Main Topics  . Smoking status: Never Smoker   . Smokeless tobacco: Not on file  . Alcohol Use: Yes  . Drug Use: No  . Sexually Active: Not on file   Other Topics Concern  . Not on file   Social History Narrative   MarriedGets regular exercise    ROS ALL NEGATIVE EXCEPT THOSE NOTED IN HPI  PE  General Appearance: well developed, well nourished in no acute distress HEENT: symmetrical face, PERRLA, good dentition  Neck: no JVD, thyromegaly, or adenopathy, trachea midline Chest: symmetric without deformity Cardiac: PMI non-displaced, RRR, normal S1, S2, no gallop or murmur Lung: clear to ausculation and percussion Vascular: all pulses full , Soft right carotid bruit Abdominal: nondistended, nontender, good bowel sounds, no HSM, no bruits Extremities: no cyanosis, clubbing or edema, no sign of DVT, no varicosities  Skin: normal color, no rashes Neuro: alert and oriented x 3, non-focal Pysch: normal affect  EKG Normal sinus rhythm, old inferior [name] infarct, no change BMET    Component Value Date/Time  NA 138 04/20/2010 2052   K 4.6 04/20/2010 2052   CL 102 04/20/2010 2052   CO2 23 04/20/2010 2052   GLUCOSE 107* 04/20/2010 2052   BUN 11 04/20/2010 2052   CREATININE 0.65 04/20/2010 2052   CALCIUM 9.2 04/20/2010 2052    Lipid Panel     Component Value Date/Time   CHOL 203* 04/20/2010 2052   TRIG 78 04/20/2010 2052   HDL 76 04/20/2010 2052   CHOLHDL 2.7 Ratio 04/20/2010 2052   VLDL 16 04/20/2010 2052   LDLCALC 111* 04/20/2010 2052    CBC No results found for this basename: wbc, rbc, hgb, hct, plt, mcv, mch, mchc, rdw, neutrabs, lymphsabs, monoabs, eosabs, basosabs

## 2012-03-11 NOTE — Assessment & Plan Note (Signed)
Stable. No change in medications. Continue aggressive secondary prevention.

## 2012-03-11 NOTE — Patient Instructions (Signed)
Your physician has requested that you have a carotid duplex. This test is an ultrasound of the carotid arteries in your neck. It looks at blood flow through these arteries that supply the brain with blood. Allow one hour for this exam. There are no restrictions or special instructions.  Your physician recommends that you return for fasting lab work on the same day as your carotid duplex:  Lipids, CMP.  Your physician wants you to follow-up in: 12 months.  You will receive a reminder letter in the mail two months in advance. If you don't receive a letter, please call our office to schedule the follow-up appointment.

## 2012-03-11 NOTE — Assessment & Plan Note (Signed)
Good control

## 2012-04-01 ENCOUNTER — Encounter (INDEPENDENT_AMBULATORY_CARE_PROVIDER_SITE_OTHER): Payer: Medicare Other

## 2012-04-01 ENCOUNTER — Ambulatory Visit (INDEPENDENT_AMBULATORY_CARE_PROVIDER_SITE_OTHER): Payer: Medicare Other | Admitting: *Deleted

## 2012-04-01 DIAGNOSIS — I6529 Occlusion and stenosis of unspecified carotid artery: Secondary | ICD-10-CM | POA: Diagnosis not present

## 2012-04-01 DIAGNOSIS — E785 Hyperlipidemia, unspecified: Secondary | ICD-10-CM

## 2012-04-01 DIAGNOSIS — I10 Essential (primary) hypertension: Secondary | ICD-10-CM

## 2012-04-01 LAB — LIPID PANEL
Cholesterol: 175 mg/dL (ref 0–200)
HDL: 77.6 mg/dL (ref >39.00)
VLDL: 7 mg/dL (ref 0.0–40.0)

## 2012-04-01 LAB — BASIC METABOLIC PANEL
Chloride: 104 mEq/L (ref 96–112)
GFR: 143.35 mL/min (ref >60.00)
Potassium: 4 mEq/L (ref 3.5–5.1)
Sodium: 140 mEq/L (ref 135–145)

## 2012-04-01 LAB — HEPATIC FUNCTION PANEL
ALT: 11 U/L (ref 0–53)
Total Bilirubin: 1 mg/dL (ref 0.3–1.2)
Total Protein: 7.6 g/dL (ref 6.0–8.3)

## 2012-05-23 ENCOUNTER — Other Ambulatory Visit: Payer: Self-pay | Admitting: Cardiology

## 2012-06-03 ENCOUNTER — Other Ambulatory Visit: Payer: Self-pay | Admitting: Cardiology

## 2012-11-04 ENCOUNTER — Other Ambulatory Visit: Payer: Self-pay | Admitting: *Deleted

## 2012-11-04 MED ORDER — BENAZEPRIL HCL 40 MG PO TABS: 40.0000 mg | ORAL_TABLET | Freq: Every day | ORAL | Status: DC

## 2013-01-08 ENCOUNTER — Other Ambulatory Visit: Payer: Self-pay | Admitting: Cardiology

## 2013-05-13 ENCOUNTER — Encounter: Payer: Self-pay | Admitting: Cardiology

## 2013-05-13 ENCOUNTER — Ambulatory Visit (INDEPENDENT_AMBULATORY_CARE_PROVIDER_SITE_OTHER): Payer: Medicare Other | Admitting: Cardiology

## 2013-05-13 VITALS — BP 132/76 | HR 77 | Ht 68.0 in | Wt 185.6 lb

## 2013-05-13 DIAGNOSIS — I10 Essential (primary) hypertension: Secondary | ICD-10-CM

## 2013-05-13 DIAGNOSIS — I739 Peripheral vascular disease, unspecified: Secondary | ICD-10-CM

## 2013-05-13 DIAGNOSIS — I6529 Occlusion and stenosis of unspecified carotid artery: Secondary | ICD-10-CM

## 2013-05-13 DIAGNOSIS — I2510 Atherosclerotic heart disease of native coronary artery without angina pectoris: Secondary | ICD-10-CM

## 2013-05-13 DIAGNOSIS — I252 Old myocardial infarction: Secondary | ICD-10-CM

## 2013-05-13 DIAGNOSIS — E785 Hyperlipidemia, unspecified: Secondary | ICD-10-CM

## 2013-05-13 DIAGNOSIS — R635 Abnormal weight gain: Secondary | ICD-10-CM

## 2013-05-13 NOTE — Progress Notes (Signed)
HPI [name] [name] comes in today for evaluation and management of his diffuse vascular disease, history of a out-of-hospital inferior [name] [name] MI and history of carotid artery disease. He has hyperlipidemia and hypertension.  He denies any angina or chest discomfort. He's had no symptoms of TIAs. He denies any claudication. His Parkinson's disease is stable with adjustment of his medications by a physician in [place].  He needs blood work and carotid Dopplers.  Past Medical History  Diagnosis Date  . Acute myocardial infarction of other inferior [name] [name], episode of care unspecified   . Unspecified cerebral artery occlusion with cerebral infarction 2006    CVA  . Coronary atherosclerosis of native coronary artery   . Occlusion and stenosis of carotid artery without mention of cerebral infarction   . Other and unspecified hyperlipidemia     Severe  . Unspecified essential hypertension   . Parkinson's disease   . Peripheral vascular disease, unspecified     Current Outpatient Prescriptions  Medication Sig Dispense Refill  . amLODipine (NORVASC) 10 MG tablet TAKE 1 TABLET EVERY DAY  30 tablet  11  . aspirin 325 MG EC tablet Take 325 mg by mouth daily.        Marland Kitchen atorvastatin (LIPITOR) 80 MG tablet TAKE 1 TABLET EVERY DAY  30 tablet  12  . benazepril (LOTENSIN) 40 MG tablet Take 1 tablet (40 mg total) by mouth daily.  30 tablet  6  . Omega-3 Fatty Acids (FISH OIL) 1200 MG CAPS Take 2 capsules by mouth daily.        . Rasagiline Mesylate (AZILECT) 1 MG TABS Take 2 tablets by mouth 3 (three) times daily.        No current facility-administered medications for this visit.    Allergies  Allergen Reactions  . Penicillins     Family History  Problem Relation Age of Onset  . Heart disease Other     History   Social History  . Marital Status: Married    Spouse Name: N/A    Number of Children: N/A  . Years of Education: N/A   Occupational History  . GOLF TEACHER     Full time   Social History  Main Topics  . Smoking status: Never Smoker   . Smokeless tobacco: Not on file  . Alcohol Use: Yes  . Drug Use: No  . Sexually Active: Not on file   Other Topics Concern  . Not on file   Social History Narrative   Married   Gets regular exercise    ROS ALL NEGATIVE EXCEPT THOSE NOTED IN HPI  PE  General Appearance: well developed, well nourished in no acute distress HEENT: symmetrical face, PERRLA, good dentition  Neck: no JVD, thyromegaly, or adenopathy, trachea midline Chest: symmetric without deformity Cardiac: PMI non-displaced, RRR, normal S1, S2, no gallop or murmur Lung: clear to ausculation and percussion Vascular: Soft right carotid bruit, 1+ over 4+ peripheral pulses Abdominal: nondistended, nontender, good bowel sounds, no HSM, no bruits Extremities: no cyanosis, clubbing or edema, no sign of DVT, no varicosities  Skin: normal color, no rashes Neuro: alert and oriented x 3, non-focal, mild tremor Pysch: normal affect  EKG Normal sinus rhythm, old inferior [name] [name] infarct, no changes. BMET    Component Value Date/Time   NA 140 04/01/2012 0940   K 4.0 04/01/2012 0940   CL 104 04/01/2012 0940   CO2 28 04/01/2012 0940   GLUCOSE 100* 04/01/2012 0940   BUN 12 04/01/2012 0940   CREATININE  0.6 04/01/2012 0940   CALCIUM 9.2 04/01/2012 0940    Lipid Panel     Component Value Date/Time   CHOL 175 04/01/2012 0940   TRIG 35.0 04/01/2012 0940   HDL 77.60 04/01/2012 0940   CHOLHDL 2 04/01/2012 0940   VLDL 7.0 04/01/2012 0940   LDLCALC 90 04/01/2012 0940    CBC No results found for this basename: wbc, rbc, hgb, hct, plt, mcv, mch, mchc, rdw, neutrabs, lymphsabs, monoabs, eosabs, basosabs

## 2013-05-13 NOTE — Assessment & Plan Note (Signed)
Stable. Continue secondary to therapy. Followup in one year.

## 2013-05-13 NOTE — Patient Instructions (Addendum)
Your physician has requested that you have a carotid duplex in [place]. This test is an ultrasound of the carotid arteries in your neck. It looks at blood flow through these arteries that supply the brain with blood. Allow one hour for this exam. There are no restrictions or special instructions.  Your physician recommends that you return for lab work at the [facility] office for fasting cholesterol, and CMP the same day as your carotid duplex.  Your physician recommends that you continue on your current medications as directed. Please refer to the Current Medication list given to you today.  Your physician wants you to follow-up in: 1 year with Dr. Mariah Milling.  You will receive a reminder letter in the mail two months in advance. If you don't receive a letter, please call our office to schedule the follow-up appointment.

## 2013-05-13 NOTE — Assessment & Plan Note (Signed)
Repeat carotid Dopplers. Continue secondary therapy.

## 2013-05-21 ENCOUNTER — Other Ambulatory Visit: Payer: Medicare Other

## 2013-05-27 ENCOUNTER — Ambulatory Visit (INDEPENDENT_AMBULATORY_CARE_PROVIDER_SITE_OTHER): Payer: Medicare Other

## 2013-05-27 DIAGNOSIS — E785 Hyperlipidemia, unspecified: Secondary | ICD-10-CM

## 2013-05-27 DIAGNOSIS — I2510 Atherosclerotic heart disease of native coronary artery without angina pectoris: Secondary | ICD-10-CM

## 2013-05-28 LAB — HEPATIC FUNCTION PANEL
ALT: 10 IU/L (ref 0–44)
AST: 30 IU/L (ref 0–40)
Albumin: 4.4 g/dL (ref 3.6–4.8)
Alkaline Phosphatase: 58 IU/L (ref 39–117)

## 2013-05-28 LAB — LIPID PANEL
Cholesterol, Total: 191 mg/dL (ref 100–199)
HDL: 81 mg/dL (ref >39)
LDL Calculated: 99 mg/dL (ref 0–99)
Triglycerides: 56 mg/dL (ref 0–149)
VLDL Cholesterol Cal: 11 mg/dL (ref 5–40)

## 2013-06-04 ENCOUNTER — Encounter: Payer: Self-pay | Admitting: *Deleted

## 2013-06-16 ENCOUNTER — Other Ambulatory Visit: Payer: Self-pay | Admitting: Cardiology

## 2013-07-01 ENCOUNTER — Encounter (INDEPENDENT_AMBULATORY_CARE_PROVIDER_SITE_OTHER): Payer: Medicare Other

## 2013-07-01 DIAGNOSIS — I6529 Occlusion and stenosis of unspecified carotid artery: Secondary | ICD-10-CM

## 2013-07-18 ENCOUNTER — Other Ambulatory Visit: Payer: Self-pay | Admitting: Cardiology

## 2013-08-31 DIAGNOSIS — D239 Other benign neoplasm of skin, unspecified: Secondary | ICD-10-CM | POA: Diagnosis not present

## 2013-08-31 DIAGNOSIS — L819 Disorder of pigmentation, unspecified: Secondary | ICD-10-CM | POA: Diagnosis not present

## 2013-08-31 DIAGNOSIS — R21 Rash and other nonspecific skin eruption: Secondary | ICD-10-CM | POA: Diagnosis not present

## 2013-08-31 DIAGNOSIS — L219 Seborrheic dermatitis, unspecified: Secondary | ICD-10-CM | POA: Diagnosis not present

## 2013-08-31 DIAGNOSIS — D485 Neoplasm of uncertain behavior of skin: Secondary | ICD-10-CM | POA: Diagnosis not present

## 2013-08-31 DIAGNOSIS — L570 Actinic keratosis: Secondary | ICD-10-CM | POA: Diagnosis not present

## 2013-08-31 DIAGNOSIS — L259 Unspecified contact dermatitis, unspecified cause: Secondary | ICD-10-CM | POA: Diagnosis not present

## 2013-08-31 DIAGNOSIS — L719 Rosacea, unspecified: Secondary | ICD-10-CM | POA: Diagnosis not present

## 2013-10-06 ENCOUNTER — Encounter: Payer: Self-pay | Admitting: Neurology

## 2013-10-17 ENCOUNTER — Encounter: Payer: Self-pay | Admitting: Neurology

## 2013-11-16 ENCOUNTER — Encounter: Payer: Self-pay | Admitting: Neurology

## 2013-12-31 DIAGNOSIS — D485 Neoplasm of uncertain behavior of skin: Secondary | ICD-10-CM | POA: Diagnosis not present

## 2014-01-01 ENCOUNTER — Other Ambulatory Visit: Payer: Self-pay | Admitting: Cardiology

## 2014-01-07 ENCOUNTER — Encounter (INDEPENDENT_AMBULATORY_CARE_PROVIDER_SITE_OTHER): Payer: Medicare Other

## 2014-01-07 DIAGNOSIS — I6529 Occlusion and stenosis of unspecified carotid artery: Secondary | ICD-10-CM

## 2014-01-28 DIAGNOSIS — D485 Neoplasm of uncertain behavior of skin: Secondary | ICD-10-CM | POA: Diagnosis not present

## 2014-02-20 ENCOUNTER — Other Ambulatory Visit: Payer: Self-pay | Admitting: Cardiology

## 2014-03-24 ENCOUNTER — Other Ambulatory Visit: Payer: Self-pay | Admitting: *Deleted

## 2014-03-24 MED ORDER — BENAZEPRIL HCL 40 MG PO TABS: ORAL_TABLET | ORAL | Status: DC

## 2014-03-24 NOTE — Telephone Encounter (Signed)
Requested Prescriptions   Signed Prescriptions Disp Refills  . benazepril (LOTENSIN) 40 MG tablet 90 tablet 1    Sig: TAKE 1 TABLET EVERY DAY    Authorizing Provider: Minna Merritts    Ordering User: Britt Bottom

## 2014-04-13 DIAGNOSIS — R413 Other amnesia: Secondary | ICD-10-CM | POA: Diagnosis not present

## 2014-04-13 DIAGNOSIS — G20 Parkinson's disease: Secondary | ICD-10-CM | POA: Diagnosis not present

## 2014-05-04 ENCOUNTER — Other Ambulatory Visit: Payer: Self-pay | Admitting: Cardiovascular Disease

## 2014-05-14 ENCOUNTER — Ambulatory Visit: Payer: Medicare Other | Admitting: Cardiovascular Disease

## 2014-05-17 ENCOUNTER — Ambulatory Visit (INDEPENDENT_AMBULATORY_CARE_PROVIDER_SITE_OTHER): Payer: Medicare Other | Admitting: Cardiovascular Disease

## 2014-05-17 ENCOUNTER — Encounter: Payer: Self-pay | Admitting: Cardiovascular Disease

## 2014-05-17 VITALS — BP 130/82 | HR 67 | Ht 68.0 in | Wt 196.0 lb

## 2014-05-17 DIAGNOSIS — I739 Peripheral vascular disease, unspecified: Secondary | ICD-10-CM

## 2014-05-17 DIAGNOSIS — I2510 Atherosclerotic heart disease of native coronary artery without angina pectoris: Secondary | ICD-10-CM

## 2014-05-17 DIAGNOSIS — I10 Essential (primary) hypertension: Secondary | ICD-10-CM | POA: Diagnosis not present

## 2014-05-17 DIAGNOSIS — E785 Hyperlipidemia, unspecified: Secondary | ICD-10-CM

## 2014-05-17 MED ORDER — EZETIMIBE 10 MG PO TABS: 10.0000 mg | ORAL_TABLET | Freq: Every day | ORAL | Status: DC

## 2014-05-17 NOTE — Assessment & Plan Note (Signed)
He is not at goal. Recommended he continue on his Lipitor, add zetia 10 mg daily

## 2014-05-17 NOTE — Assessment & Plan Note (Signed)
Blood pressure is well controlled on today's visit. No changes made to the medications. 

## 2014-05-17 NOTE — Assessment & Plan Note (Signed)
Currently with no symptoms of angina. No further workup at this time. Continue current medication regimen. 

## 2014-05-17 NOTE — Progress Notes (Signed)
   Patient ID: [name] [name]., male    DOB: Nov 06, 1946, 68 y.o.   MRN: [id number]  HPI Comments: Mr. Goodwine is a pleasant 68 year old gentleman with a history of carotid disease on the right, prior MI in 1994 with disease in the distal RCA with medical management recommended at that time, stress test in January 2012 with fixed inferior wall defect, Presents for routine followup He has a history of Parkinson's  He reports that he has no chest pain or shortness of breath with exertion. He is active, but does not do regular exercise program. Is tolerating his medications. Reports his blood pressure is well controlled.  Review of prior cholesterol measurements show that he's not at goal with LDL close to 100, total cholesterol in the 190 range Carotid disease on the right estimated at 60-79%, mild disease on the left  EKG shows normal sinus rhythm with rate 67 beats per minute, no significant ST or T wave changes   Outpatient Encounter Prescriptions as of 05/17/2014  Medication Sig  . amLODipine (NORVASC) 10 MG tablet TAKE 1 TABLET EVERY DAY  . aspirin 325 MG EC tablet Take 325 mg by mouth daily.    Marland Kitchen atorvastatin (LIPITOR) 80 MG tablet TAKE 1 TABLET EVERY DAY  . benazepril (LOTENSIN) 40 MG tablet TAKE 1 TABLET EVERY DAY  . carbidopa-levodopa (SINEMET IR) 25-100 MG per tablet Take 3 tablets by mouth 3 (three) times daily.   . Omega-3 Fatty Acids (FISH OIL) 1200 MG CAPS Take 2 capsules by mouth daily.    Marland Kitchen ezetimibe (ZETIA) 10 MG tablet Take 1 tablet (10 mg total) by mouth daily.    Review of Systems  Constitutional: Negative.   HENT: Negative.   Eyes: Negative.   Respiratory: Negative.   Cardiovascular: Negative.   Gastrointestinal: Negative.   Endocrine: Negative.   Musculoskeletal: Negative.   Skin: Negative.   Allergic/Immunologic: Negative.   Neurological: Negative.   Hematological: Negative.   Psychiatric/Behavioral: Negative.   All other systems reviewed and are  negative.   BP 130/82  Pulse 67  Ht 5\' 8"  (1.727 m)  Wt 196 lb (88.905 kg)  BMI 29.81 kg/m2  Physical Exam  Nursing note and vitals reviewed. Constitutional: He is oriented to person, place, and time. He appears well-developed and well-nourished.  HENT:  Head: Normocephalic.  Nose: Nose normal.  Mouth/Throat: Oropharynx is clear and moist.  Eyes: Conjunctivae are normal. Pupils are equal, round, and reactive to light.  Neck: Normal range of motion. Neck supple. No JVD present.  Cardiovascular: Normal rate, regular rhythm, S1 normal, S2 normal, normal heart sounds and intact distal pulses.  Exam reveals no gallop and no friction rub.   No murmur heard. Pulmonary/Chest: Effort normal and breath sounds normal. No respiratory distress. He has no wheezes. He has no rales. He exhibits no tenderness.  Abdominal: Soft. Bowel sounds are normal. He exhibits no distension. There is no tenderness.  Musculoskeletal: Normal range of motion. He exhibits no edema and no tenderness.  Lymphadenopathy:    He has no cervical adenopathy.  Neurological: He is alert and oriented to person, place, and time. Coordination normal.  Skin: Skin is warm and dry. No rash noted. No erythema.  Psychiatric: He has a normal mood and affect. His behavior is normal. Judgment and thought content normal.      Assessment and Plan

## 2014-05-17 NOTE — Assessment & Plan Note (Signed)
60-79% carotid disease on the right. Stable over the past several years, increased stenosis from 2011

## 2014-05-17 NOTE — Patient Instructions (Addendum)
You are doing well. Please start zetia one a day for cholesterol  If tolerated and affordable, continue on the zetia Then call the office for fenofibrate  Add oatmeal and fiber Watch your diet Try to get down a few pounds  Please call us if you have new issues that need to be addressed before your next appt.  Your physician wants you to follow-up in: 12 months.  You will receive a reminder letter in the mail two months in advance. If you don't receive a letter, please call our office to schedule the follow-up appointment.

## 2014-05-18 ENCOUNTER — Encounter: Payer: Self-pay | Admitting: Cardiovascular Disease

## 2014-06-29 DIAGNOSIS — G20 Parkinson's disease: Secondary | ICD-10-CM | POA: Diagnosis not present

## 2014-07-01 DIAGNOSIS — L570 Actinic keratosis: Secondary | ICD-10-CM | POA: Diagnosis not present

## 2014-07-01 DIAGNOSIS — L719 Rosacea, unspecified: Secondary | ICD-10-CM | POA: Diagnosis not present

## 2014-07-01 DIAGNOSIS — Z85828 Personal history of other malignant neoplasm of skin: Secondary | ICD-10-CM | POA: Diagnosis not present

## 2014-07-01 DIAGNOSIS — L820 Inflamed seborrheic keratosis: Secondary | ICD-10-CM | POA: Diagnosis not present

## 2014-07-01 DIAGNOSIS — R21 Rash and other nonspecific skin eruption: Secondary | ICD-10-CM | POA: Diagnosis not present

## 2014-07-01 DIAGNOSIS — D485 Neoplasm of uncertain behavior of skin: Secondary | ICD-10-CM | POA: Diagnosis not present

## 2014-07-07 ENCOUNTER — Other Ambulatory Visit: Payer: Self-pay | Admitting: Cardiovascular Disease

## 2014-07-17 DIAGNOSIS — B029 Zoster without complications: Secondary | ICD-10-CM

## 2014-07-17 HISTORY — DX: Zoster without complications: B02.9

## 2014-07-29 ENCOUNTER — Other Ambulatory Visit (HOSPITAL_COMMUNITY): Payer: Self-pay | Admitting: Cardiology

## 2014-07-29 DIAGNOSIS — I6529 Occlusion and stenosis of unspecified carotid artery: Secondary | ICD-10-CM

## 2014-08-02 DIAGNOSIS — B029 Zoster without complications: Secondary | ICD-10-CM | POA: Diagnosis not present

## 2014-08-07 ENCOUNTER — Other Ambulatory Visit: Payer: Self-pay | Admitting: Cardiology

## 2014-09-12 DIAGNOSIS — J069 Acute upper respiratory infection, unspecified: Secondary | ICD-10-CM | POA: Diagnosis not present

## 2014-09-22 DIAGNOSIS — J069 Acute upper respiratory infection, unspecified: Secondary | ICD-10-CM | POA: Diagnosis not present

## 2014-09-22 DIAGNOSIS — G20 Parkinson's disease: Secondary | ICD-10-CM | POA: Diagnosis not present

## 2014-09-22 DIAGNOSIS — B029 Zoster without complications: Secondary | ICD-10-CM | POA: Diagnosis not present

## 2014-09-22 DIAGNOSIS — K429 Umbilical hernia without obstruction or gangrene: Secondary | ICD-10-CM | POA: Diagnosis not present

## 2014-09-24 ENCOUNTER — Encounter: Payer: Self-pay | Admitting: General Surgery

## 2014-09-28 ENCOUNTER — Other Ambulatory Visit: Payer: Self-pay | Admitting: Cardiovascular Disease

## 2014-10-07 ENCOUNTER — Ambulatory Visit: Payer: Self-pay | Admitting: General Surgery

## 2014-10-13 ENCOUNTER — Encounter: Payer: Self-pay | Admitting: General Surgery

## 2014-10-13 ENCOUNTER — Ambulatory Visit (INDEPENDENT_AMBULATORY_CARE_PROVIDER_SITE_OTHER): Payer: Medicare Other | Admitting: General Surgery

## 2014-10-13 VITALS — BP 122/72 | HR 72 | Resp 12 | Ht 68.0 in | Wt 195.0 lb

## 2014-10-13 DIAGNOSIS — K4090 Unilateral inguinal hernia, without obstruction or gangrene, not specified as recurrent: Secondary | ICD-10-CM

## 2014-10-13 DIAGNOSIS — K429 Umbilical hernia without obstruction or gangrene: Secondary | ICD-10-CM | POA: Diagnosis not present

## 2014-10-13 DIAGNOSIS — I2510 Atherosclerotic heart disease of native coronary artery without angina pectoris: Secondary | ICD-10-CM | POA: Diagnosis not present

## 2014-10-13 NOTE — Progress Notes (Signed)
Patient ID: [name] Liming., male   DOB: May 25, 1946, 68 y.o.   MRN: [id number]  Chief Complaint  Patient presents with  . Hernia    HPI [name] Gonzaga. is a 68 y.o. male. Here today for evaluation of a hernia. He states the right groin hernia has been there for about 10 years. He feels like it has gotten larger over the past 6 months to1 yr. Denies pain.  He also states he has an umbilical hernia for about 6 years. It seems to have gotten larger over the past 1-2 years. Denies any abdominal pain. Denies any bowel issues.  He is using Gabapentin  for shingles (left abdomen) which seems to help.  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   HPI  Past Medical History  Diagnosis Date  . Unspecified cerebral artery occlusion with cerebral infarction 2006    CVA  . Coronary atherosclerosis of native coronary artery   . Occlusion and stenosis of carotid artery without mention of cerebral infarction   . Other and unspecified hyperlipidemia     Severe  . Unspecified essential hypertension   . Parkinson's disease   . Peripheral vascular disease, unspecified   . Cancer     skin  . Shingles August 2015  . Acute myocardial infarction of other inferior wall, episode of care unspecified May 1994    Past Surgical History  Procedure Laterality Date  . Skin biopsy      skin cancer    Family History  Problem Relation Age of Onset  . Heart disease Other   . Hypertension Mother   . Heart disease Father     Social History History  Substance Use Topics  . Smoking status: Never Smoker   . Smokeless tobacco: Never Used  . Alcohol Use: Yes     Comment: 3 glasses of wine/day    Allergies  Allergen Reactions  . Penicillins     Current Outpatient Prescriptions  Medication Sig Dispense Refill  . amLODipine (NORVASC) 10 MG tablet TAKE 1 TABLET EVERY DAY  30 tablet  6  . aspirin 325 MG EC tablet Take 325 mg by mouth daily.        Marland Kitchen  atorvastatin (LIPITOR) 80 MG tablet TAKE 1 TABLET EVERY DAY USUALLY IN THE EVENING  30 tablet  3  . benazepril (LOTENSIN) 40 MG tablet TAKE 1 TABLET EVERY DAY  30 tablet  3  . carbidopa-levodopa (SINEMET IR) 25-100 MG per tablet Take 2 tablets by mouth 3 (three) times daily.       Marland Kitchen gabapentin (NEURONTIN) 100 MG capsule Take 1 capsule by mouth 2 (two) times daily.      . Omega-3 Fatty Acids (FISH OIL) 1200 MG CAPS Take 2 capsules by mouth daily.         No current facility-administered medications for this visit.    Review of Systems Review of Systems  Constitutional: Negative.   Respiratory: Negative.   Cardiovascular: Negative.   Gastrointestinal: Negative for abdominal pain, diarrhea and constipation.    Blood pressure 122/72, pulse 72, resp. rate 12, height 5\' 8"  (1.727 m), weight 195 lb (88.451 kg).  Physical Exam Physical Exam  Constitutional: He is oriented to person, place, and time. He appears well-developed and well-nourished.  Neck: Neck supple.  Cardiovascular: Normal rate and regular rhythm.   Murmur heard.  Systolic murmur is present with a grade of 2/6  Pulmonary/Chest: Effort normal and breath  sounds normal.  Abdominal: Soft. Normal appearance. A hernia is present. Hernia confirmed positive in the right inguinal area.  reducible Right inguinal hernia and 2 cm defect umbilical hernia. Diastasis recti is present.  Genitourinary: Testes normal.  Lymphadenopathy:    He has no cervical adenopathy.  Neurological: He is alert and oriented to person, place, and time.  Skin: Skin is warm and dry.    Data Reviewed PCP notes of 09/22/2014.  Assessment    Long-standing umbilical and right inguinal hernias, recently symptomatic.    Plan    The patient remains active, and elective repair is appropriate.     Hernia precautions and incarceration were discussed with the patient. If they develop symptoms of an incarcerated hernia, they were encouraged to seek prompt  medical attention.  I have recommended repair of the hernia using mesh on an outpatient basis in the near future. The risk of infection was reviewed. The role of prosthetic mesh to minimize the risk of recurrence was reviewed.  Patient is leaning towards having surgery in January 2016. He will call the surgery scheduler when ready to proceed. This patient will need a pre-op visit around one week prior to procedure.   PCP/Ref Philemon Kingdom 10/15/2014, 1:57 PM

## 2014-10-13 NOTE — Patient Instructions (Addendum)
The patient is aware to call back for any questions or concerns.   Inguinal Hernia, Adult Muscles help keep everything in the body in its proper place. But if a weak spot in the muscles develops, something can poke through. That is called a hernia. When this happens in the lower part of the belly (abdomen), it is called an inguinal hernia. (It takes its name from a part of the body in this region called the inguinal canal.) A weak spot in the wall of muscles lets some fat or part of the small intestine bulge through. An inguinal hernia can develop at any age. Men get them more often than women. CAUSES  In adults, an inguinal hernia develops over time.  It can be triggered by:  Suddenly straining the muscles of the lower abdomen.  Lifting heavy objects.  Straining to have a bowel movement. Difficult bowel movements (constipation) can lead to this.  Constant coughing. This may be caused by smoking or lung disease.  Being overweight.  Being pregnant.  Working at a job that requires long periods of standing or heavy lifting.  Having had an inguinal hernia before. One type can be an emergency situation. It is called a strangulated inguinal hernia. It develops if part of the small intestine slips through the weak spot and cannot get back into the abdomen. The blood supply can be cut off. If that happens, part of the intestine may die. This situation requires emergency surgery. SYMPTOMS  Often, a small inguinal hernia has no symptoms. It is found when a healthcare provider does a physical exam. Larger hernias usually have symptoms.   In adults, symptoms may include:  A lump in the groin. This is easier to see when the person is standing. It might disappear when lying down.  In men, a lump in the scrotum.  Pain or burning in the groin. This occurs especially when lifting, straining or coughing.  A dull ache or feeling of pressure in the groin.  Signs of a strangulated hernia can  include:  A bulge in the groin that becomes very painful and tender to the touch.  A bulge that turns red or purple.  Fever, nausea and vomiting.  Inability to have a bowel movement or to pass gas. DIAGNOSIS  To decide if you have an inguinal hernia, a healthcare provider will probably do a physical examination.  This will include asking questions about any symptoms you have noticed.  The healthcare provider might feel the groin area and ask you to cough. If an inguinal hernia is felt, the healthcare provider may try to slide it back into the abdomen.  Usually no other tests are needed. TREATMENT  Treatments can vary. The size of the hernia makes a difference. Options include:  Watchful waiting. This is often suggested if the hernia is small and you have had no symptoms.  No medical procedure will be done unless symptoms develop.  You will need to watch closely for symptoms. If any occur, contact your healthcare provider right away.  Surgery. This is used if the hernia is larger or you have symptoms.  Open surgery. This is usually an outpatient procedure (you will not stay overnight in a hospital). An cut (incision) is made through the skin in the groin. The hernia is put back inside the abdomen. The weak area in the muscles is then repaired by herniorrhaphy or hernioplasty. Herniorrhaphy: in this type of surgery, the weak muscles are sewn back together. Hernioplasty: a patch or  mesh is used to close the weak area in the abdominal wall.  Laparoscopy. In this procedure, a surgeon makes small incisions. A thin tube with a tiny video camera (called a laparoscope) is put into the abdomen. The surgeon repairs the hernia with mesh by looking with the video camera and using two long instruments. HOME CARE INSTRUCTIONS   After surgery to repair an inguinal hernia:  You will need to take pain medicine prescribed by your healthcare provider. Follow all directions carefully.  You will need  to take care of the wound from the incision.  Your activity will be restricted for awhile. This will probably include no heavy lifting for several weeks. You also should not do anything too active for a few weeks. When you can return to work will depend on the type of job that you have.  During "watchful waiting" periods, you should:  Maintain a healthy weight.  Eat a diet high in fiber (fruits, vegetables and whole grains).  Drink plenty of fluids to avoid constipation. This means drinking enough water and other liquids to keep your urine clear or pale yellow.  Do not lift heavy objects.  Do not stand for long periods of time.  Quit smoking. This should keep you from developing a frequent cough. SEEK MEDICAL CARE IF:   A bulge develops in your groin area.  You feel pain, a burning sensation or pressure in the groin. This might be worse if you are lifting or straining.  You develop a fever of more than 100.5 F (38.1 C). SEEK IMMEDIATE MEDICAL CARE IF:   Pain in the groin increases suddenly.  A bulge in the groin gets bigger suddenly and does not go down.  For men, there is sudden pain in the scrotum. Or, the size of the scrotum increases.  A bulge in the groin area becomes red or purple and is painful to touch.  You have nausea or vomiting that does not go away.  You feel your heart beating much faster than normal.  You cannot have a bowel movement or pass gas.  You develop a fever of more than 102.0 F (38.9 C). Document Released: 04/21/2009 Document Revised: 02/25/2012 Document Reviewed: 04/21/2009 [facility] Patient Information 2015 [place], [place]. This information is not intended to replace advice given to you by your health care provider. Make sure you discuss any questions you have with your health care provider.  Patient is leaning towards having surgery in January 2016. He will call the surgery scheduler when ready to proceed. This patient will need a pre-op  visit around one week prior to procedure.

## 2014-10-15 DIAGNOSIS — K469 Unspecified abdominal hernia without obstruction or gangrene: Secondary | ICD-10-CM | POA: Insufficient documentation

## 2014-10-15 DIAGNOSIS — K429 Umbilical hernia without obstruction or gangrene: Secondary | ICD-10-CM | POA: Insufficient documentation

## 2014-11-12 ENCOUNTER — Other Ambulatory Visit: Payer: Self-pay | Admitting: Cardiovascular Disease

## 2014-12-08 DIAGNOSIS — H6692 Otitis media, unspecified, left ear: Secondary | ICD-10-CM | POA: Diagnosis not present

## 2014-12-08 DIAGNOSIS — J019 Acute sinusitis, unspecified: Secondary | ICD-10-CM | POA: Diagnosis not present

## 2014-12-08 DIAGNOSIS — B029 Zoster without complications: Secondary | ICD-10-CM | POA: Diagnosis not present

## 2014-12-08 DIAGNOSIS — K429 Umbilical hernia without obstruction or gangrene: Secondary | ICD-10-CM | POA: Diagnosis not present

## 2014-12-13 ENCOUNTER — Ambulatory Visit: Payer: Self-pay | Admitting: Family Medicine

## 2014-12-13 DIAGNOSIS — B029 Zoster without complications: Secondary | ICD-10-CM | POA: Diagnosis not present

## 2014-12-13 DIAGNOSIS — R05 Cough: Secondary | ICD-10-CM | POA: Diagnosis not present

## 2014-12-13 DIAGNOSIS — I10 Essential (primary) hypertension: Secondary | ICD-10-CM | POA: Diagnosis not present

## 2014-12-13 DIAGNOSIS — R062 Wheezing: Secondary | ICD-10-CM | POA: Diagnosis not present

## 2014-12-13 DIAGNOSIS — J019 Acute sinusitis, unspecified: Secondary | ICD-10-CM | POA: Diagnosis not present

## 2014-12-13 DIAGNOSIS — G20 Parkinson's disease: Secondary | ICD-10-CM | POA: Diagnosis not present

## 2015-02-04 ENCOUNTER — Other Ambulatory Visit: Payer: Self-pay | Admitting: Cardiovascular Disease

## 2015-02-08 ENCOUNTER — Encounter: Payer: Self-pay | Admitting: General Surgery

## 2015-02-08 ENCOUNTER — Ambulatory Visit (INDEPENDENT_AMBULATORY_CARE_PROVIDER_SITE_OTHER): Payer: Medicare Other | Admitting: General Surgery

## 2015-02-08 VITALS — BP 114/68 | HR 80 | Resp 12 | Ht 68.0 in

## 2015-02-08 DIAGNOSIS — K429 Umbilical hernia without obstruction or gangrene: Secondary | ICD-10-CM | POA: Diagnosis not present

## 2015-02-08 DIAGNOSIS — K4090 Unilateral inguinal hernia, without obstruction or gangrene, not specified as recurrent: Secondary | ICD-10-CM | POA: Diagnosis not present

## 2015-02-08 NOTE — Patient Instructions (Addendum)
Patient scheduled for surgery on 02/17/15.  Patient is scheduled for right inguinal and incisional hernia repair on 02/17/15. He will pre admit at the hospital on 02/10/15 at 8:30 am. Patient is aware of dates, time, and all instructions.

## 2015-02-08 NOTE — Progress Notes (Signed)
Patient ID: [name], male   DOB: August 27, 1946, 69 y.o.   MRN: [id number]  Chief Complaint  Patient presents with  . Pre-op Exam    right inguinal hernia    HPI Olen Eaves. is a 69 y.o. male here today for his pre op right inguinal hernia and incisional hernia scheduled on 02/17/15 HPI  Past Medical History  Diagnosis Date  . Unspecified cerebral artery occlusion with cerebral infarction 2006    CVA  . Coronary atherosclerosis of native coronary artery   . Occlusion and stenosis of carotid artery without mention of cerebral infarction   . Other and unspecified hyperlipidemia     Severe  . Unspecified essential hypertension   . Parkinson's disease   . Peripheral vascular disease, unspecified   . Cancer     skin  . Shingles August 2015  . Acute myocardial infarction of other inferior wall, episode of care unspecified May 1994    Past Surgical History  Procedure Laterality Date  . Skin biopsy      skin cancer    Family History  Problem Relation Age of Onset  . Heart disease Other   . Hypertension Mother   . Heart disease Father     Social History History  Substance Use Topics  . Smoking status: Never Smoker   . Smokeless tobacco: Never Used  . Alcohol Use: Yes     Comment: 3 glasses of wine/day    Allergies  Allergen Reactions  . Penicillins     Current Outpatient Prescriptions  Medication Sig Dispense Refill  . amLODipine (NORVASC) 10 MG tablet TAKE 1 TABLET EVERY DAY 30 tablet 6  . aspirin 325 MG EC tablet Take 325 mg by mouth daily.      Marland Kitchen atorvastatin (LIPITOR) 80 MG tablet TAKE 1 TABLET EVERY DAY USUALLY IN THE EVENING 30 tablet 6  . benazepril (LOTENSIN) 40 MG tablet TAKE 1 TABLET EVERY DAY 30 tablet 3  . carbidopa-levodopa (SINEMET IR) 25-100 MG per tablet Take 2 tablets by mouth 3 (three) times daily.     Marland Kitchen gabapentin (NEURONTIN) 100 MG capsule Take 1 capsule by mouth 2 (two) times daily.    . Omega-3 Fatty Acids (FISH OIL) 1200 MG CAPS Take  2 capsules by mouth daily.       No current facility-administered medications for this visit.    Review of Systems Review of Systems  Constitutional: Negative.   Respiratory: Negative.   Cardiovascular: Negative.     Blood pressure 114/68, pulse 80, resp. rate 12, height 5\' 8"  (1.727 m).  Physical Exam Physical Exam  Constitutional: He is oriented to person, place, and time. He appears well-developed and well-nourished.  Eyes: Conjunctivae are normal. No scleral icterus.  Neck: Neck supple.  Cardiovascular: Normal rate.   Murmur heard.  Systolic murmur is present with a grade of 2/6  Pulmonary/Chest: Effort normal and breath sounds normal.  Abdominal: Soft. Normal appearance and bowel sounds are normal. There is no hepatomegaly. There is no tenderness. A hernia ( Right inguinal hernia and 2 cm defect umbilical hernia. Diastasis recti is present. ) is present. Hernia confirmed positive in the right inguinal area.  Lymphadenopathy:    He has no cervical adenopathy.  Neurological: He is alert and oriented to person, place, and time.  Skin: Skin is warm and dry.    Data Reviewed Case discussed w/ Dr. Brigitte Pulse from Neurology and Dr. Kayleen Memos from anesthesia. No contraindication to general anesthesia.  Assessment  Umbilical and right angle hernia repair.    Plan    We will plan for laparoscopic repair of the inguinal hernia with mesh placement and possible mesh placement at the umbilical hernia site.   Patient is scheduled for right inguinal and incisional hernia repair on 02/17/15. He will pre admit at the hospital on 02/10/15 at 8:30 am. Patient is aware of dates, time, and all instructions.      PCP/Ref Philemon Kingdom 02/09/2015, 2:01 PM

## 2015-02-09 ENCOUNTER — Other Ambulatory Visit: Payer: Self-pay | Admitting: General Surgery

## 2015-02-09 DIAGNOSIS — K4090 Unilateral inguinal hernia, without obstruction or gangrene, not specified as recurrent: Secondary | ICD-10-CM

## 2015-02-09 DIAGNOSIS — K429 Umbilical hernia without obstruction or gangrene: Secondary | ICD-10-CM

## 2015-02-10 ENCOUNTER — Encounter: Payer: Self-pay | Admitting: General Surgery

## 2015-02-10 ENCOUNTER — Ambulatory Visit: Payer: Self-pay | Admitting: General Surgery

## 2015-02-10 DIAGNOSIS — K4090 Unilateral inguinal hernia, without obstruction or gangrene, not specified as recurrent: Secondary | ICD-10-CM | POA: Diagnosis not present

## 2015-02-10 DIAGNOSIS — Z01812 Encounter for preprocedural laboratory examination: Secondary | ICD-10-CM | POA: Diagnosis not present

## 2015-02-10 DIAGNOSIS — I10 Essential (primary) hypertension: Secondary | ICD-10-CM | POA: Diagnosis not present

## 2015-02-10 DIAGNOSIS — Z01810 Encounter for preprocedural cardiovascular examination: Secondary | ICD-10-CM | POA: Diagnosis not present

## 2015-02-10 DIAGNOSIS — I2510 Atherosclerotic heart disease of native coronary artery without angina pectoris: Secondary | ICD-10-CM | POA: Diagnosis not present

## 2015-02-10 LAB — BASIC METABOLIC PANEL
BUN: 9 mg/dL (ref 4–21)
Creatinine: 0.5 mg/dL — AB (ref 0.6–1.3)
GLUCOSE: 104 mg/dL
Potassium: 3.5 mmol/L (ref 3.4–5.3)
SODIUM: 139 mmol/L (ref 137–147)

## 2015-02-10 LAB — CBC AND DIFFERENTIAL
HEMATOCRIT: 43 % (ref 41–53)
Hemoglobin: 14.3 g/dL (ref 13.5–17.5)
NEUTROS ABS: 4 /uL
Platelets: 208 10*3/uL (ref 150–399)
WBC: 6 10*3/mL

## 2015-02-17 ENCOUNTER — Ambulatory Visit: Payer: Self-pay | Admitting: General Surgery

## 2015-02-17 DIAGNOSIS — K4090 Unilateral inguinal hernia, without obstruction or gangrene, not specified as recurrent: Secondary | ICD-10-CM | POA: Diagnosis not present

## 2015-02-17 DIAGNOSIS — K420 Umbilical hernia with obstruction, without gangrene: Secondary | ICD-10-CM | POA: Diagnosis not present

## 2015-02-17 DIAGNOSIS — K429 Umbilical hernia without obstruction or gangrene: Secondary | ICD-10-CM | POA: Diagnosis not present

## 2015-02-17 DIAGNOSIS — G20 Parkinson's disease: Secondary | ICD-10-CM | POA: Diagnosis not present

## 2015-02-17 DIAGNOSIS — I10 Essential (primary) hypertension: Secondary | ICD-10-CM | POA: Diagnosis not present

## 2015-02-17 DIAGNOSIS — Z880 Allergy status to penicillin: Secondary | ICD-10-CM | POA: Diagnosis not present

## 2015-02-17 DIAGNOSIS — R011 Cardiac murmur, unspecified: Secondary | ICD-10-CM | POA: Diagnosis not present

## 2015-02-17 DIAGNOSIS — K219 Gastro-esophageal reflux disease without esophagitis: Secondary | ICD-10-CM | POA: Diagnosis not present

## 2015-02-17 DIAGNOSIS — I2510 Atherosclerotic heart disease of native coronary artery without angina pectoris: Secondary | ICD-10-CM | POA: Diagnosis not present

## 2015-02-17 DIAGNOSIS — I252 Old myocardial infarction: Secondary | ICD-10-CM | POA: Diagnosis not present

## 2015-02-17 HISTORY — PX: HERNIA REPAIR: SHX51

## 2015-02-18 ENCOUNTER — Encounter: Payer: Self-pay | Admitting: General Surgery

## 2015-02-24 ENCOUNTER — Ambulatory Visit (INDEPENDENT_AMBULATORY_CARE_PROVIDER_SITE_OTHER): Payer: Self-pay | Admitting: General Surgery

## 2015-02-24 ENCOUNTER — Encounter: Payer: Self-pay | Admitting: General Surgery

## 2015-02-24 VITALS — BP 130/80 | HR 74 | Resp 14 | Ht 68.0 in | Wt 190.0 lb

## 2015-02-24 DIAGNOSIS — K4090 Unilateral inguinal hernia, without obstruction or gangrene, not specified as recurrent: Secondary | ICD-10-CM

## 2015-02-24 NOTE — Patient Instructions (Signed)
Patient to return in one month. 

## 2015-02-24 NOTE — Progress Notes (Signed)
Patient ID: [name] Liming., male   DOB: 1946-06-05, 69 y.o.   MRN: [id number]  Chief Complaint  Patient presents with  . Routine Post Op    ventral and right inguinal     HPI [name] [name]. is a 69 y.o. male here today for his post op ventral hernia and right inguinal hernia repair done on 02/17/15. Patient states he is doing well. Narcotics discontinued after day 4, minimal soreness. Normal bowel movements with cessation of narcotics. HPI  Past Medical History  Diagnosis Date  . Unspecified cerebral artery occlusion with cerebral infarction 2006    CVA  . Coronary atherosclerosis of native coronary artery   . Occlusion and stenosis of carotid artery without mention of cerebral infarction   . Other and unspecified hyperlipidemia     Severe  . Unspecified essential hypertension   . Parkinson's disease   . Peripheral vascular disease, unspecified   . Cancer     skin  . Shingles August 2015  . Acute myocardial infarction of other inferior wall, episode of care unspecified May 1994    Past Surgical History  Procedure Laterality Date  . Skin biopsy      skin cancer  . Hernia repair  02/17/15    right inguinal hernia , laparoscopic placement of [employer] 3-D max mesh  . Hernia repair  3/316    ventral hernia, umbilical, open repair    Family History  Problem Relation Age of Onset  . Heart disease Other   . Hypertension Mother   . Heart disease Father     Social History History  Substance Use Topics  . Smoking status: Never Smoker   . Smokeless tobacco: Never Used  . Alcohol Use: Yes     Comment: 3 glasses of wine/day    Allergies  Allergen Reactions  . Penicillins     Current Outpatient Prescriptions  Medication Sig Dispense Refill  . amLODipine (NORVASC) 10 MG tablet TAKE 1 TABLET EVERY DAY 30 tablet 6  . aspirin 325 MG EC tablet Take 325 mg by mouth daily.      Marland Kitchen atorvastatin (LIPITOR) 80 MG tablet TAKE 1 TABLET EVERY DAY USUALLY IN THE EVENING 30 tablet 6  .  benazepril (LOTENSIN) 40 MG tablet TAKE 1 TABLET EVERY DAY 30 tablet 3  . carbidopa-levodopa (SINEMET IR) 25-100 MG per tablet Take 2 tablets by mouth 3 (three) times daily.     Marland Kitchen gabapentin (NEURONTIN) 100 MG capsule Take 1 capsule by mouth 2 (two) times daily.    Marland Kitchen HYDROcodone-acetaminophen (NORCO/VICODIN) 5-325 MG per tablet     . Omega-3 Fatty Acids (FISH OIL) 1200 MG CAPS Take 2 capsules by mouth daily.       No current facility-administered medications for this visit.    Review of Systems Review of Systems  Constitutional: Negative.   Respiratory: Negative.   Cardiovascular: Negative.     Blood pressure 130/80, pulse 74, resp. rate 14, height 5\' 8"  (1.727 m), weight 190 lb (86.183 kg).  Physical Exam Physical Exam  Constitutional: He is oriented to person, place, and time. He appears well-developed and well-nourished.  Cardiovascular: Normal rate, regular rhythm and normal heart sounds.   Pulmonary/Chest: Effort normal and breath sounds normal.  Abdominal: Soft. Bowel sounds are normal.  Neurological: He is alert and oriented to person, place, and time.  Skin: Skin is warm and dry.  Umbilical hernia repair intact and right inguinal incision looks clean and healing welll.  No weakness  in the right groin. Mild fullness likely secondary to a small seroma  Assessment    Doing well status post right inguinal and umbilical hernia repair.    Plan    Patient to return in one month      PCP:  Philemon Kingdom 02/26/2015, 8:33 AM

## 2015-02-26 ENCOUNTER — Encounter: Payer: Self-pay | Admitting: General Surgery

## 2015-03-09 DIAGNOSIS — H2513 Age-related nuclear cataract, bilateral: Secondary | ICD-10-CM | POA: Diagnosis not present

## 2015-03-21 ENCOUNTER — Other Ambulatory Visit: Payer: Self-pay | Admitting: Cardiovascular Disease

## 2015-03-24 ENCOUNTER — Ambulatory Visit (INDEPENDENT_AMBULATORY_CARE_PROVIDER_SITE_OTHER): Payer: Medicare Other | Admitting: General Surgery

## 2015-03-24 ENCOUNTER — Encounter: Payer: Self-pay | Admitting: General Surgery

## 2015-03-24 VITALS — BP 124/72 | HR 78 | Resp 16 | Ht 68.0 in | Wt 195.0 lb

## 2015-03-24 DIAGNOSIS — K4090 Unilateral inguinal hernia, without obstruction or gangrene, not specified as recurrent: Secondary | ICD-10-CM

## 2015-03-24 DIAGNOSIS — K429 Umbilical hernia without obstruction or gangrene: Secondary | ICD-10-CM

## 2015-03-24 NOTE — Progress Notes (Signed)
Patient ID: [name] Liming., male   DOB: 1946/09/17, 69 y.o.   MRN: [id number]  Chief Complaint  Patient presents with  . Routine Post Op    HPI [name] [name]. is a 69 y.o. male.  here today for his post op ventral hernia and right inguinal hernia repair done on 02/17/15. Patient states he is doing well. Bowels moving daily. He is back to walking 5 days a week.   HPI  Past Medical History  Diagnosis Date  . Unspecified cerebral artery occlusion with cerebral infarction 2006    CVA  . Coronary atherosclerosis of native coronary artery   . Occlusion and stenosis of carotid artery without mention of cerebral infarction   . Other and unspecified hyperlipidemia     Severe  . Unspecified essential hypertension   . Parkinson's disease   . Peripheral vascular disease, unspecified   . Cancer     skin  . Shingles August 2015  . Acute myocardial infarction of other inferior wall, episode of care unspecified May 1994    Past Surgical History  Procedure Laterality Date  . Skin biopsy      skin cancer  . Hernia repair  02/17/15    right inguinal hernia , laparoscopic placement of Bard 3-D max mesh  . Hernia repair  3/316    ventral hernia, umbilical, open repair    Family History  Problem Relation Age of Onset  . Heart disease Other   . Hypertension Mother   . Heart disease Father     Social History History  Substance Use Topics  . Smoking status: Never Smoker   . Smokeless tobacco: Never Used  . Alcohol Use: Yes     Comment: 3 glasses of wine/day    Allergies  Allergen Reactions  . Penicillins     Current Outpatient Prescriptions  Medication Sig Dispense Refill  . amLODipine (NORVASC) 10 MG tablet TAKE 1 TABLET EVERY DAY 30 tablet 3  . aspirin 325 MG EC tablet Take 325 mg by mouth daily.      Marland Kitchen atorvastatin (LIPITOR) 80 MG tablet TAKE 1 TABLET EVERY DAY USUALLY IN THE EVENING 30 tablet 6  . benazepril (LOTENSIN) 40 MG tablet TAKE 1 TABLET EVERY DAY 30 tablet 3  .  carbidopa-levodopa (SINEMET IR) 25-100 MG per tablet Take 2 tablets by mouth 3 (three) times daily.     . Omega-3 Fatty Acids (FISH OIL) 1200 MG CAPS Take 2 capsules by mouth daily.       No current facility-administered medications for this visit.    Review of Systems Review of Systems  Constitutional: Negative.   Respiratory: Negative.   Cardiovascular: Negative.   Gastrointestinal: Negative for nausea, vomiting, diarrhea and constipation.    Blood pressure 124/72, pulse 78, resp. rate 16, height 5\' 8"  (1.727 m), weight 195 lb (88.451 kg).  Physical Exam Physical Exam  Abdominal:       Assessment    Doing well status post right laparoscopic hernia repair and umbilical hernia repair.    Plan    He patient was reassured that the residual seroma should continue to resolve. He remains asymptomatic. Good judgment was strenuous activity was encouraged. Follow-up will be on an as-needed basis.     PCP:  Cranford Mon, Magdalene Molly, Forest Gleason 03/25/2015, 9:39 PM

## 2015-04-06 DIAGNOSIS — G20 Parkinson's disease: Secondary | ICD-10-CM | POA: Diagnosis not present

## 2015-04-17 NOTE — Op Note (Signed)
PATIENT NAME:  [name] [name], [name] [name] MR#:  [id number] DATE OF BIRTH:  1946/06/30  DATE OF PROCEDURE:  02/17/2015  PREOPERATIVE DIAGNOSES:  1. Right inguinal hernia.  2. Umbilical hernia with incarcerated omentum.   POSTOPERATIVE DIAGNOSES:  1. Right inguinal hernia.  2. Umbilical hernia with incarcerated omentum.   OPERATIVE PROCEDURES: 1. Repair of umbilical hernia.  2. Laparoscopic right inguinal hernia repair.   SURGEON: Robert Bellow, M.D.    ANESTHESIA: General endotracheal under Dr. Ronelle Nigh, Marcaine 0.5% plain, 30 mL local infiltration.   CLINICAL NOTE: This 69 year old male presents with a sizable right inguinal hernia, as well as an umbilical hernia with incarcerated fat. He was admitted for elective repair.    Preoperative consultation with his neurologist, Dr. Manuella Ghazi at the [facility], reported no contraindication to general anesthesia in light of his known Parkinson disease.   PROCEDURE IN DETAIL: The patient received Kefzol prior to the procedure. A Foley catheter was placed by the nurse after the induction of general endotracheal anesthesia. The abdomen and groin were prepped with ChloraPrep and draped.   An infraumbilical incision was made and carried down through the skin and subcutaneous tissue with the remaining dissection completed with sharp and cautery dissection. The umbilical skin was elevated off the hernia sac. The sac was excised at the fascial level and the fascial defect was found to be fairly small at 2 cm. The thickened sac was excised and discarded. A 0 Prolene suture was placed followed by a Hasson cannula. The abdomen was then insufflated with 10 mmHg pressure with CO2; 5-mm step ports were placed within both rectus sheaths, and attention turned to the right groin. There appeared to be a fairly sizable hernia with both a direct and indirect component. The peritoneum superior to the internal ring was opened with sharp and cautery dissection. The inferior  epigastric vessel was identified and protected. The peritoneum and hernia sac were swept inferiorly and the cord dissected identifying the vas and vessels. A large 3-DMax mesh measuring 3.5 x 5.5 inches was then brought into the field and placed into the preperitoneal space. It was anchored to [employer] with secure strap tacks, and several tacks were placed across the superior medial mesh taking care to avoid the inferior epigastric vessels. The peritoneum was then reapproximated with application of the secure strap tack. During the peritoneal apposition, there was evidence of bleeding, and this was thought likely to be from the inferior epigastric vessel. This was irrigated and hemostasis was noted at the end of the procedure.   The abdomen was desufflated. The pursestring suture removed. The fascial defect at the umbilicus was closed with interrupted 0 Surgilon sutures. The adipose layer was approximated here with a running 3-0 Vicryl. The skin was closed with a running 4-0 Vicryl subcuticular suture. Benzoin and Steri-Strips, followed by Telfa and Tegaderm dressings were applied after the instillation of local anesthetic.   The patient tolerated the procedure well.    ____________________________ Robert Bellow, MD jwb:JT D: 02/17/2015 22:07:18 ET T: 02/18/2015 09:10:29 ET JOB#: [id number]  cc: Robert Bellow, MD, <Dictator> Constant [place] Amedeo Kinsman MD ELECTRONICALLY SIGNED 02/19/2015 7:41

## 2015-07-26 ENCOUNTER — Other Ambulatory Visit: Payer: Self-pay | Admitting: Cardiovascular Disease

## 2015-09-23 ENCOUNTER — Other Ambulatory Visit: Payer: Self-pay | Admitting: Cardiovascular Disease

## 2015-09-29 ENCOUNTER — Ambulatory Visit (INDEPENDENT_AMBULATORY_CARE_PROVIDER_SITE_OTHER): Payer: Medicare Other | Admitting: Cardiovascular Disease

## 2015-09-29 ENCOUNTER — Encounter: Payer: Self-pay | Admitting: Cardiovascular Disease

## 2015-09-29 VITALS — BP 120/80 | HR 72 | Ht 68.0 in | Wt 190.8 lb

## 2015-09-29 DIAGNOSIS — I25118 Atherosclerotic heart disease of native coronary artery with other forms of angina pectoris: Secondary | ICD-10-CM

## 2015-09-29 DIAGNOSIS — G20 Parkinson's disease: Secondary | ICD-10-CM

## 2015-09-29 DIAGNOSIS — I739 Peripheral vascular disease, unspecified: Secondary | ICD-10-CM | POA: Diagnosis not present

## 2015-09-29 DIAGNOSIS — E785 Hyperlipidemia, unspecified: Secondary | ICD-10-CM

## 2015-09-29 DIAGNOSIS — I6523 Occlusion and stenosis of bilateral carotid arteries: Secondary | ICD-10-CM | POA: Diagnosis not present

## 2015-09-29 DIAGNOSIS — I10 Essential (primary) hypertension: Secondary | ICD-10-CM

## 2015-09-29 MED ORDER — EZETIMIBE 10 MG PO TABS: 10.0000 mg | ORAL_TABLET | Freq: Every day | ORAL | Status: DC

## 2015-09-29 NOTE — Assessment & Plan Note (Signed)
Prescription provided for Zetia. He did not fill on his last clinic visit Prior history of cholesterol always above goal Details explained to him. Goal LDL less than 70 He will check the price of zetia and start if acceptable. Should go generic in the next several months

## 2015-09-29 NOTE — Assessment & Plan Note (Signed)
Reports symptoms are currently stable on Sinemet

## 2015-09-29 NOTE — Assessment & Plan Note (Signed)
Last study 1/ 2015 Repeat carotid ultrasound has been ordered

## 2015-09-29 NOTE — Assessment & Plan Note (Signed)
Blood pressure is well controlled on today's visit. No changes made to the medications. 

## 2015-09-29 NOTE — Assessment & Plan Note (Signed)
Currently with no symptoms of angina. No further workup at this time. Continue current medication regimen. 

## 2015-09-29 NOTE — Progress Notes (Signed)
Patient ID: [name] Liming., male    DOB: 26-Jul-1946, 69 y.o.   MRN: [id number]  HPI Comments: [name] [name] is a pleasant 69 year old gentleman with a history of carotid disease on the right, prior MI in 1994 with disease in the distal RCA with medical management recommended at that time, stress test in January 2012 with fixed inferior wall defect, Presents for routine followup He has a history of Parkinson's  In follow-up today, he reports that he is doing well. Exercises several days per week Reports his blood pressure is well controlled. Overall no complaints. Denies any chest pain or shortness of breath with exertion Tremor from his Parkinson's Tolerating his medications On his last visit was started on zetia. He did not start this No recent lipid panel available  EKG on today's visit shows normal sinus rhythm with rate 72 bpm, no significant ST or T-wave changes  Other past medical history Review of prior cholesterol measurements show that he's not at goal with LDL close to 100, total cholesterol in the 190 range Carotid disease on the right estimated at 60-79%, mild disease on the left    Allergies  Allergen Reactions  . Penicillins     Current Outpatient Prescriptions on File Prior to Visit  Medication Sig Dispense Refill  . amLODipine (NORVASC) 10 MG tablet TAKE 1 TABLET EVERY DAY 30 tablet 1  . atorvastatin (LIPITOR) 80 MG tablet TAKE 1 TABLET EVERY DAY USUALLY IN THE EVENING 30 tablet 6  . benazepril (LOTENSIN) 40 MG tablet TAKE 1 TABLET EVERY DAY 30 tablet 1  . carbidopa-levodopa (SINEMET IR) 25-100 MG per tablet Take 2 tablets by mouth 3 (three) times daily.     . Omega-3 Fatty Acids (FISH OIL) 1200 MG CAPS Take 2 capsules by mouth daily.       No current facility-administered medications on file prior to visit.    Past Medical History  Diagnosis Date  . Unspecified cerebral artery occlusion with cerebral infarction 2006    CVA  . Coronary atherosclerosis of native  coronary artery   . Occlusion and stenosis of carotid artery without mention of cerebral infarction   . Other and unspecified hyperlipidemia     Severe  . Unspecified essential hypertension   . Parkinson's disease   . Peripheral vascular disease, unspecified ([place])   . Cancer ([place])     skin  . Shingles August 2015  . Acute myocardial infarction of other inferior wall, episode of care unspecified May 1994  . History of shingles     waist area/right back side    Past Surgical History  Procedure Laterality Date  . Skin biopsy      skin cancer  . Hernia repair  02/17/15    right inguinal hernia , laparoscopic placement of Bard 3-D max mesh  . Hernia repair  3/316    ventral hernia, umbilical, open repair    Social History  reports that he has never smoked. He has never used smokeless tobacco. He reports that he drinks alcohol. He reports that he does not use illicit drugs.  Family History family history includes Heart disease in his father and other; Hypertension in his mother.        Review of Systems  Constitutional: Negative.   HENT: Negative.   Respiratory: Negative.   Cardiovascular: Negative.   Gastrointestinal: Negative.   Endocrine: Negative.   Musculoskeletal: Negative.   Allergic/Immunologic: Negative.   Neurological: Positive for tremors.  Hematological: Negative.   Psychiatric/Behavioral:  Negative.   All other systems reviewed and are negative.   BP 120/80 mmHg  Pulse 72  Ht 5\' 8"  (1.727 m)  Wt 190 lb 12 oz (86.524 kg)  BMI 29.01 kg/m2  Physical Exam  Constitutional: He is oriented to person, place, and time. He appears well-developed and well-nourished.  HENT:  Head: Normocephalic.  Nose: Nose normal.  Mouth/Throat: Oropharynx is clear and moist.  Eyes: Conjunctivae are normal. Pupils are equal, round, and reactive to light.  Neck: Normal range of motion. Neck supple. No JVD present.  Cardiovascular: Normal rate, regular rhythm, S1 normal, S2  normal, normal heart sounds and intact distal pulses.  Exam reveals no gallop and no friction rub.   No murmur heard. Pulmonary/Chest: Effort normal and breath sounds normal. No respiratory distress. He has no wheezes. He has no rales. He exhibits no tenderness.  Abdominal: Soft. Bowel sounds are normal. He exhibits no distension. There is no tenderness.  Musculoskeletal: Normal range of motion. He exhibits no edema or tenderness.  Lymphadenopathy:    He has no cervical adenopathy.  Neurological: He is alert and oriented to person, place, and time. Coordination normal.  Skin: Skin is warm and dry. No rash noted. No erythema.  Psychiatric: He has a normal mood and affect. His behavior is normal. Judgment and thought content normal.      Assessment and Plan   Nursing note and vitals reviewed.

## 2015-09-29 NOTE — Patient Instructions (Addendum)
You are doing very well!  We will order a Carotid ultrasound for carotid stenosis  _____________________________________________  We will give you a script for zetia Take zetia one a day for cholesterol It will drop another 10%  Please call us if you have new issues that need to be addressed before your next appt.  Your physician wants you to follow-up in: 6 months.  You will receive a reminder letter in the mail two months in advance. If you don't receive a letter, please call our office to schedule the follow-up appointment.  Carotid Artery Disease The carotid arteries are the two main arteries on either side of the neck that supply blood to the brain. Carotid artery disease, also called carotid artery stenosis, is the narrowing or blockage of one or both carotid arteries. Carotid artery disease increases your risk for a stroke or a transient ischemic attack (TIA). A TIA is an episode in which a waxy, fatty substance that accumulates within the artery (plaque) blocks blood flow to the brain. A TIA is considered a "warning stroke."  CAUSES   Buildup of plaque inside the carotid arteries (atherosclerosis) (common).  A weakened outpouching in an artery (aneurysm).  Inflammation of the carotid artery (arteritis).  A fibrous growth within the carotid artery (fibromuscular dysplasia).  Tissue death within the carotid artery due to radiation treatment (post-radiation necrosis).  Decreased blood flow due to spasms of the carotid artery (vasospasm).  Separation of the walls of the carotid artery (carotid dissection). RISK FACTORS  High cholesterol (dyslipidemia).   High blood pressure (hypertension).   Smoking.   Obesity.   Diabetes.   Family history of cardiovascular disease.   Inactivity or lack of regular exercise.   Being male. Men have an increased risk of developing atherosclerosis earlier in life than women.  SYMPTOMS  Carotid artery disease does not cause  symptoms. DIAGNOSIS Diagnosis of carotid artery disease may include:   A physical exam. Your health care provider may hear an abnormal sound (bruit) when listening to the carotid arteries.   Specific tests that look at the blood flow in the carotid arteries. These tests include:   Carotid artery ultrasonography.   Carotid or cerebral angiography.   Computerized tomographic angiography (CTA).   Magnetic resonance angiography (MRA).  TREATMENT  Treatment of carotid artery disease can include a combination of treatments. Treatment options include:  Surgery. You may have:   A carotid endarterectomy. This is a surgery to remove the blockages in the carotid arteries.   A carotid angioplasty with stenting. This is a nonsurgical interventional procedure. A wire mesh (stent) is used to widen the blocked carotid arteries.   Medicines to control blood pressure, cholesterol, and reduce blood clotting (antiplatelet therapy).   Adjusting your diet.   Lifestyle changes such as:   Quitting smoking.   Exercising as tolerated or as directed by your health care provider.   Controlling and maintaining a good blood pressure.   Keeping cholesterol levels under control.  HOME CARE INSTRUCTIONS   Take medicines only as directed by your health care provider. Make sure you understand all your medicine instructions. Do not stop your medicines without talking to your health care provider.   Follow your health care provider's diet instructions. It is important to eat a healthy diet that is low in saturated fats and includes plenty of fresh fruits, vegetables, and lean meats. High-fat, high-sodium foods as well as foods that are fried, overly processed, or have poor nutritional value should be  avoided.  Maintain a healthy weight.   Stay physically active. It is recommended that you get at least 30 minutes of activity every day.   Do not use any tobacco products including  cigarettes, chewing tobacco, or electronic cigarettes. If you need help quitting, ask your health care provider.  Limit alcohol use to:   No more than 2 drinks per day for men.   No more than 1 drink per day for nonpregnant women.   Do not use illegal drugs.   Keep all follow-up visits as directed by your health care provider.  SEEK IMMEDIATE MEDICAL CARE IF:  You develop TIA or stroke symptoms. These include:   Sudden weakness or numbness on one side of the body, such as in the face, arm, or leg.   Sudden confusion.   Trouble speaking (aphasia) or understanding.   Sudden trouble seeing out of one or both eyes.   Sudden trouble walking.   Dizziness or feeling like you might faint.   Loss of balance or coordination.   Sudden severe headache with no known cause.   Sudden trouble swallowing (dysphagia).  If you have any of these symptoms, call your local emergency services (911 in [place] Do not drive yourself to the clinic or hospital. This is a medical emergency.    This information is not intended to replace advice given to you by your health care provider. Make sure you discuss any questions you have with your health care provider.   Document Released: 02/25/2012 Document Revised: 12/24/2014 Document Reviewed: 06/03/2013 Elsevier Interactive Patient Education [employer].

## 2015-09-29 NOTE — Assessment & Plan Note (Signed)
Repeat carotid ultrasound ordered None done recently

## 2015-09-30 ENCOUNTER — Other Ambulatory Visit: Payer: Self-pay | Admitting: Cardiovascular Disease

## 2015-10-06 DIAGNOSIS — G20 Parkinson's disease: Secondary | ICD-10-CM | POA: Diagnosis not present

## 2015-10-06 DIAGNOSIS — G249 Dystonia, unspecified: Secondary | ICD-10-CM | POA: Diagnosis not present

## 2015-10-19 ENCOUNTER — Other Ambulatory Visit: Payer: Self-pay | Admitting: Cardiovascular Disease

## 2015-10-19 DIAGNOSIS — I6523 Occlusion and stenosis of bilateral carotid arteries: Secondary | ICD-10-CM

## 2015-10-28 ENCOUNTER — Ambulatory Visit (INDEPENDENT_AMBULATORY_CARE_PROVIDER_SITE_OTHER): Payer: Medicare Other

## 2015-10-28 DIAGNOSIS — I6523 Occlusion and stenosis of bilateral carotid arteries: Secondary | ICD-10-CM

## 2015-10-28 DIAGNOSIS — I25118 Atherosclerotic heart disease of native coronary artery with other forms of angina pectoris: Secondary | ICD-10-CM | POA: Diagnosis not present

## 2015-10-28 DIAGNOSIS — I10 Essential (primary) hypertension: Secondary | ICD-10-CM | POA: Diagnosis not present

## 2016-02-07 ENCOUNTER — Other Ambulatory Visit: Payer: Self-pay | Admitting: Cardiovascular Disease

## 2016-03-22 DIAGNOSIS — L570 Actinic keratosis: Secondary | ICD-10-CM | POA: Diagnosis not present

## 2016-03-22 DIAGNOSIS — X32XXXA Exposure to sunlight, initial encounter: Secondary | ICD-10-CM | POA: Diagnosis not present

## 2016-03-22 DIAGNOSIS — C434 Malignant melanoma of scalp and neck: Secondary | ICD-10-CM | POA: Diagnosis not present

## 2016-03-22 DIAGNOSIS — D485 Neoplasm of uncertain behavior of skin: Secondary | ICD-10-CM | POA: Diagnosis not present

## 2016-03-29 ENCOUNTER — Encounter: Payer: Self-pay | Admitting: Cardiovascular Disease

## 2016-03-29 ENCOUNTER — Ambulatory Visit (INDEPENDENT_AMBULATORY_CARE_PROVIDER_SITE_OTHER): Payer: Medicare Other | Admitting: Cardiovascular Disease

## 2016-03-29 VITALS — BP 110/70 | HR 76 | Ht 68.0 in | Wt 178.5 lb

## 2016-03-29 DIAGNOSIS — E785 Hyperlipidemia, unspecified: Secondary | ICD-10-CM

## 2016-03-29 DIAGNOSIS — I25118 Atherosclerotic heart disease of native coronary artery with other forms of angina pectoris: Secondary | ICD-10-CM | POA: Diagnosis not present

## 2016-03-29 DIAGNOSIS — I739 Peripheral vascular disease, unspecified: Secondary | ICD-10-CM | POA: Diagnosis not present

## 2016-03-29 DIAGNOSIS — I10 Essential (primary) hypertension: Secondary | ICD-10-CM

## 2016-03-29 MED ORDER — ATORVASTATIN CALCIUM 80 MG PO TABS: 80.0000 mg | ORAL_TABLET | Freq: Every day | ORAL | Status: DC

## 2016-03-29 MED ORDER — AMLODIPINE BESYLATE 10 MG PO TABS: 10.0000 mg | ORAL_TABLET | Freq: Every day | ORAL | Status: DC

## 2016-03-29 MED ORDER — BENAZEPRIL HCL 40 MG PO TABS: 40.0000 mg | ORAL_TABLET | Freq: Every day | ORAL | Status: DC

## 2016-03-29 NOTE — Progress Notes (Signed)
Patient ID: [name] Liming., male    DOB: 01/01/1946, 70 y.o.   MRN: [id number]  HPI Comments: [name] [name] is a pleasant 70 year old gentleman with a history of bilateral moderatecarotid disease, prior MI in 1994 with disease in the distal RCA with medical management recommended at that time, stress test in January 2012 with fixed inferior wall defect, Presents for routine followup of his coronary artery disease and PAD He has a history of Parkinson's  In follow-up today, he reports that he is doing well.  Reports that he is exercising 6 days per week on the treadmill Balance is good, feels like Parkinson's is stable Reports his blood pressure is well controlled.  Overall no complaints. Denies any chest pain or shortness of breath with exertion Tremor from his Parkinson's  He has not seen primary care in 2 years, no recent lab work Reports he will follow-up with Dr. Rosanna Randy for routine annual physical  EKG on today's visit shows normal sinus rhythm with rate 76 bpm, no significant ST or T-wave changes  Other past medical history Review of prior cholesterol measurements show that he's not at goal with LDL close to 100, total cholesterol in the 190 range Carotid disease on the right estimated at 60-79%, mild disease on the left    Allergies  Allergen Reactions  . Penicillins     Current Outpatient Prescriptions on File Prior to Visit  Medication Sig Dispense Refill  . amLODipine (NORVASC) 10 MG tablet TAKE 1 TABLET EVERY DAY 30 tablet 1  . atorvastatin (LIPITOR) 80 MG tablet TAKE 1 TABLET EVERY DAY USUALLY IN THE EVENING 30 tablet 6  . benazepril (LOTENSIN) 40 MG tablet TAKE 1 TABLET EVERY DAY 30 tablet 1  . carbidopa-levodopa (SINEMET IR) 25-100 MG per tablet Take 2 tablets by mouth 3 (three) times daily.     . Omega-3 Fatty Acids (FISH OIL) 1200 MG CAPS Take 2 capsules by mouth daily.       No current facility-administered medications on file prior to visit.    Past Medical  History  Diagnosis Date  . Unspecified cerebral artery occlusion with cerebral infarction 2006    CVA  . Coronary atherosclerosis of native coronary artery   . Occlusion and stenosis of carotid artery without mention of cerebral infarction   . Other and unspecified hyperlipidemia     Severe  . Unspecified essential hypertension   . Parkinson's disease   . Peripheral vascular disease, unspecified ([place])   . Cancer ([place])     skin  . Shingles August 2015  . Acute myocardial infarction of other inferior wall, episode of care unspecified May 1994  . History of shingles     waist area/right back side    Past Surgical History  Procedure Laterality Date  . Skin biopsy      skin cancer  . Hernia repair  02/17/15    right inguinal hernia , laparoscopic placement of Bard 3-D max mesh  . Hernia repair  3/316    ventral hernia, umbilical, open repair    Social History  reports that he has never smoked. He has never used smokeless tobacco. He reports that he drinks alcohol. He reports that he does not use illicit drugs.  Family History family history includes Heart disease in his father and other; Hypertension in his mother.        Review of Systems  Constitutional: Negative.   Respiratory: Negative.   Cardiovascular: Negative.   Gastrointestinal: Negative.  Endocrine: Negative.   Musculoskeletal: Negative.   Allergic/Immunologic: Negative.   Neurological: Positive for tremors.  Hematological: Negative.   Psychiatric/Behavioral: Negative.   All other systems reviewed and are negative.   BP 110/70 mmHg  Pulse 76  Ht 5\' 8"  (1.727 m)  Wt 178 lb 8 oz (80.967 kg)  BMI 27.15 kg/m2  Physical Exam  Constitutional: He is oriented to person, place, and time. He appears well-developed and well-nourished.  HENT:  Head: Normocephalic.  Nose: Nose normal.  Mouth/Throat: Oropharynx is clear and moist.  Eyes: Conjunctivae are normal. Pupils are equal, round, and reactive to light.   Neck: Normal range of motion. Neck supple. No JVD present. Carotid bruit is present.  Cardiovascular: Normal rate, regular rhythm, S1 normal, S2 normal and intact distal pulses.  Exam reveals no gallop and no friction rub.   Murmur heard.  Systolic murmur is present with a grade of 2/6  Pulmonary/Chest: Effort normal and breath sounds normal. No respiratory distress. He has no wheezes. He has no rales. He exhibits no tenderness.  Abdominal: Soft. Bowel sounds are normal. He exhibits no distension. There is no tenderness.  Musculoskeletal: Normal range of motion. He exhibits no edema or tenderness.  Lymphadenopathy:    He has no cervical adenopathy.  Neurological: He is alert and oriented to person, place, and time. Coordination normal.  Skin: Skin is warm and dry. No rash noted. No erythema.  Psychiatric: He has a normal mood and affect. His behavior is normal. Judgment and thought content normal.      Assessment and Plan   Nursing note and vitals reviewed.

## 2016-03-29 NOTE — Assessment & Plan Note (Signed)
Blood pressure is well controlled on today's visit. No changes made to the medications. 

## 2016-03-29 NOTE — Assessment & Plan Note (Signed)
Stable carotid disease by ultrasound November 2016 Recommended repeat study on annual basis

## 2016-03-29 NOTE — Assessment & Plan Note (Signed)
Currently with no symptoms of angina. No further workup at this time. Continue current medication regimen. 

## 2016-03-29 NOTE — Assessment & Plan Note (Signed)
Recommended follow-up with Dr. Rosanna Randy for annual physical and routine lab work Suggested he stay on Lipitor, He has not been taking zetia

## 2016-03-29 NOTE — Patient Instructions (Signed)
You are doing well. No medication changes were made.  Please call us if you have new issues that need to be addressed before your next appt.  Your physician wants you to follow-up in: 12 months.  You will receive a reminder letter in the mail two months in advance. If you don't receive a letter, please call our office to schedule the follow-up appointment. 

## 2016-04-19 DIAGNOSIS — B0229 Other postherpetic nervous system involvement: Secondary | ICD-10-CM | POA: Insufficient documentation

## 2016-04-19 DIAGNOSIS — B029 Zoster without complications: Secondary | ICD-10-CM | POA: Insufficient documentation

## 2016-04-24 ENCOUNTER — Ambulatory Visit (INDEPENDENT_AMBULATORY_CARE_PROVIDER_SITE_OTHER): Payer: Medicare Other | Admitting: Family Medicine

## 2016-04-24 ENCOUNTER — Encounter: Payer: Self-pay | Admitting: Family Medicine

## 2016-04-24 VITALS — BP 104/56 | HR 60 | Temp 97.7°F | Resp 16 | Ht 68.0 in | Wt 179.0 lb

## 2016-04-24 DIAGNOSIS — E785 Hyperlipidemia, unspecified: Secondary | ICD-10-CM | POA: Diagnosis not present

## 2016-04-24 DIAGNOSIS — R739 Hyperglycemia, unspecified: Secondary | ICD-10-CM

## 2016-04-24 DIAGNOSIS — I25118 Atherosclerotic heart disease of native coronary artery with other forms of angina pectoris: Secondary | ICD-10-CM | POA: Diagnosis not present

## 2016-04-24 DIAGNOSIS — I10 Essential (primary) hypertension: Secondary | ICD-10-CM | POA: Diagnosis not present

## 2016-04-24 DIAGNOSIS — Z23 Encounter for immunization: Secondary | ICD-10-CM

## 2016-04-24 NOTE — Progress Notes (Signed)
Patient ID: [name] [name]., male   DOB: 29-Jul-1946, 70 y.o.   MRN: [id number]    Subjective:  HPI  Patient is here for follow up. His last visit with [place] was in 2015. He has been seen Dr. Rockey Situ routinely and Dr. Rockey Situ asked patient to follow up with [place] and have some labs done since its been 3 years since we had any done. Patient is not having any concerns except wondering if he should stop Aspirin prior to his Mohs surgery on Thursday. Patient is seen their office today and was going to ask them also.   In general patient is been noncompliant with follow-up but Dr. Rockey Situ got him to  realize that he needs appropriate treatment of his underlying medical problems to control CAD risk Prior to Admission medications   Medication Sig Start Date End Date Taking? Authorizing Provider  amLODipine (NORVASC) 10 MG tablet Take 1 tablet (10 mg total) by mouth daily. 03/29/16  Yes Minna Merritts, MD  aspirin 162 MG EC tablet Take 162 mg by mouth daily.   Yes Historical Provider, MD  atorvastatin (LIPITOR) 80 MG tablet Take 1 tablet (80 mg total) by mouth daily at 6 PM. 03/29/16  Yes Minna Merritts, MD  benazepril (LOTENSIN) 40 MG tablet Take 1 tablet (40 mg total) by mouth daily. 03/29/16  Yes Minna Merritts, MD  carbidopa-levodopa (SINEMET IR) 25-100 MG per tablet Take 2 tablets by mouth 3 (three) times daily.  04/02/14  Yes Historical Provider, MD  Omega-3 Fatty Acids (OMEGA 3 PO) Take 1,400 mg by mouth daily.   Yes Historical Provider, MD    Patient Active Problem List   Diagnosis Date Noted  . Herpes zona 04/19/2016  . HZV (herpes zoster virus) post herpetic neuralgia 04/19/2016  . Umbilical hernia without obstruction and without gangrene 10/15/2014  . Hernia of abdominal cavity 10/15/2014  . PVD 10/02/2010  . WEIGHT GAIN 02/21/2010  . Hyperlipidemia 02/16/2010  . Essential hypertension 02/16/2010  . Old myocardial infarction 02/16/2010  . CAD, NATIVE VESSEL 02/16/2010  . Carotid stenosis  06/14/2009  . Carotid artery obstruction 06/14/2009  . PARKINSON'S DISEASE 05/12/2009    Past Medical History  Diagnosis Date  . Unspecified cerebral artery occlusion with cerebral infarction 2006    CVA  . Coronary atherosclerosis of native coronary artery   . Occlusion and stenosis of carotid artery without mention of cerebral infarction   . Other and unspecified hyperlipidemia     Severe  . Unspecified essential hypertension   . Parkinson's disease   . Peripheral vascular disease, unspecified ([place])   . Cancer ([place])     skin  . Shingles August 2015  . Acute myocardial infarction of other inferior wall, episode of care unspecified May 1994  . History of shingles     waist area/right back side    Social History   Social History  . Marital Status: Married    Spouse Name: N/A  . Number of Children: N/A  . Years of Education: N/A   Occupational History  . [employer]     Full time   Social History Main Topics  . Smoking status: Never Smoker   . Smokeless tobacco: Never Used  . Alcohol Use: Yes     Comment: 3 glasses of wine/day  . Drug Use: No  . Sexual Activity: Not on file   Other Topics Concern  . Not on file   Social History Narrative   Married  Gets regular exercise    Allergies  Allergen Reactions  . Penicillins     Review of Systems  Constitutional: Negative.   HENT: Negative.   Eyes: Negative.   Respiratory: Negative.   Cardiovascular: Negative.   Gastrointestinal: Negative.   Musculoskeletal: Negative.   Skin: Negative.   Neurological: Positive for tremors.  Psychiatric/Behavioral: Negative.      There is no immunization history on file for this patient. Objective:  BP 104/56 mmHg  Pulse 60  Temp(Src) 97.7 F (36.5 C)  Resp 16  Ht 5\' 8"  (1.727 m)  Wt 179 lb (81.194 kg)  BMI 27.22 kg/m2  Physical Exam  Constitutional: He is oriented to person, place, and time and well-developed, well-nourished, and in no distress.  HENT:    Head: Normocephalic and atraumatic.  Eyes: Conjunctivae are normal. Pupils are equal, round, and reactive to light.  Neck: Normal range of motion. Neck supple.  Cardiovascular: Normal rate, regular rhythm, normal heart sounds and intact distal pulses.   No murmur heard. Pulmonary/Chest: Effort normal and breath sounds normal. No respiratory distress. He has no wheezes.  Musculoskeletal: He exhibits no edema or tenderness.  Neurological: He is alert and oriented to person, place, and time.  Psychiatric: Mood, memory, affect and judgment normal.    Lab Results  Component Value Date   WBC 6.0 02/10/2015   HGB 14.3 02/10/2015   HCT 43 02/10/2015   PLT 208 02/10/2015   GLUCOSE 100* 04/01/2012   CHOL 191 05/27/2013   TRIG 56 05/27/2013   HDL 81 05/27/2013   LDLCALC 99 05/27/2013   HGBA1C 5.5 04/20/2010    CMP     Component Value Date/Time   NA 139 02/10/2015   NA 140 04/01/2012 0940   K 3.5 02/10/2015   CL 104 04/01/2012 0940   CO2 28 04/01/2012 0940   GLUCOSE 100* 04/01/2012 0940   BUN 9 02/10/2015   BUN 12 04/01/2012 0940   CREATININE 0.5* 02/10/2015   CREATININE 0.6 04/01/2012 0940   CALCIUM 9.2 04/01/2012 0940   PROT 7.0 05/27/2013 0916   PROT 7.6 04/01/2012 0940   ALBUMIN 4.4 05/27/2013 0916   ALBUMIN 4.5 04/01/2012 0940   AST 30 05/27/2013 0916   ALT 10 05/27/2013 0916   ALKPHOS 58 05/27/2013 0916   BILITOT 1.6* 05/27/2013 0916    Assessment and Plan :  1. Essential hypertension Stable. Follows Dr. Rockey Situ.  2. Hyperlipidemia Will check labs today, pending results.  3. Atherosclerosis of native coronary artery of native heart with stable angina pectoris (HCC) Stable. All risk factors will be treated aggressively.  4. Need for tetanus booster administered today.  5. Need for pneumococcal vaccination Administered today.  6. Parkinson's disease  Clinically, patient movement has improved since last visit couple of years ago Patient was seen and examined  by Dr. Eulas Post and note was scribed by Theressa Millard, RMA.    Miguel Aschoff MD [facility] 04/24/2016 8:57 AM

## 2016-04-25 LAB — COMPREHENSIVE METABOLIC PANEL
A/G RATIO: 1.6 (ref 1.2–2.2)
ALK PHOS: 50 IU/L (ref 39–117)
ALT: 6 IU/L (ref 0–44)
AST: 20 IU/L (ref 0–40)
Albumin: 4.4 g/dL (ref 3.6–4.8)
BILIRUBIN TOTAL: 1.2 mg/dL (ref 0.0–1.2)
BUN/Creatinine Ratio: 16 (ref 10–24)
BUN: 9 mg/dL (ref 8–27)
CHLORIDE: 98 mmol/L (ref 96–106)
CO2: 25 mmol/L (ref 18–29)
Calcium: 9.2 mg/dL (ref 8.6–10.2)
Creatinine, Ser: 0.56 mg/dL — ABNORMAL LOW (ref 0.76–1.27)
GFR calc Af Amer: 122 mL/min/{1.73_m2} (ref >59)
GFR calc non Af Amer: 106 mL/min/{1.73_m2} (ref >59)
GLOBULIN, TOTAL: 2.8 g/dL (ref 1.5–4.5)
Glucose: 98 mg/dL (ref 65–99)
POTASSIUM: 5 mmol/L (ref 3.5–5.2)
SODIUM: 138 mmol/L (ref 134–144)
Total Protein: 7.2 g/dL (ref 6.0–8.5)

## 2016-04-25 LAB — CBC WITH DIFFERENTIAL/PLATELET
BASOS: 0 %
Basophils Absolute: 0 10*3/uL (ref 0.0–0.2)
EOS (ABSOLUTE): 0 10*3/uL (ref 0.0–0.4)
EOS: 1 %
HEMATOCRIT: 43.2 % (ref 37.5–51.0)
Hemoglobin: 14.5 g/dL (ref 12.6–17.7)
IMMATURE GRANULOCYTES: 0 %
Immature Grans (Abs): 0 10*3/uL (ref 0.0–0.1)
LYMPHS ABS: 1.9 10*3/uL (ref 0.7–3.1)
Lymphs: 29 %
MCH: 30.9 pg (ref 26.6–33.0)
MCHC: 33.6 g/dL (ref 31.5–35.7)
MCV: 92 fL (ref 79–97)
MONOS ABS: 0.6 10*3/uL (ref 0.1–0.9)
Monocytes: 8 %
NEUTROS PCT: 62 %
Neutrophils Absolute: 4.2 10*3/uL (ref 1.4–7.0)
PLATELETS: 238 10*3/uL (ref 150–379)
RBC: 4.69 x10E6/uL (ref 4.14–5.80)
RDW: 13.5 % (ref 12.3–15.4)
WBC: 6.7 10*3/uL (ref 3.4–10.8)

## 2016-04-25 LAB — LIPID PANEL WITH LDL/HDL RATIO
CHOLESTEROL TOTAL: 171 mg/dL (ref 100–199)
HDL: 63 mg/dL (ref >39)
LDL CALC: 96 mg/dL (ref 0–99)
LDl/HDL Ratio: 1.5 ratio units (ref 0.0–3.6)
TRIGLYCERIDES: 62 mg/dL (ref 0–149)
VLDL CHOLESTEROL CAL: 12 mg/dL (ref 5–40)

## 2016-04-25 LAB — HEMOGLOBIN A1C
Est. average glucose Bld gHb Est-mCnc: 111 mg/dL
Hgb A1c MFr Bld: 5.5 % (ref 4.8–5.6)

## 2016-04-25 LAB — TSH: TSH: 0.789 u[IU]/mL (ref 0.450–4.500)

## 2016-04-25 NOTE — Progress Notes (Signed)
Noted-aa 

## 2016-04-26 DIAGNOSIS — C44320 Squamous cell carcinoma of skin of unspecified parts of face: Secondary | ICD-10-CM | POA: Diagnosis not present

## 2016-04-26 DIAGNOSIS — Z85828 Personal history of other malignant neoplasm of skin: Secondary | ICD-10-CM | POA: Diagnosis not present

## 2016-04-30 ENCOUNTER — Telehealth: Payer: Self-pay | Admitting: Family Medicine

## 2016-04-30 NOTE — Telephone Encounter (Signed)
Pt callin gfor lab results from last week.  Call back [phone]  Or  [phone]  Thanks, Con Memos

## 2016-04-30 NOTE — Telephone Encounter (Signed)
Pt advised-aa 

## 2016-05-03 DIAGNOSIS — D034 Melanoma in situ of scalp and neck: Secondary | ICD-10-CM | POA: Diagnosis not present

## 2016-05-03 DIAGNOSIS — L905 Scar conditions and fibrosis of skin: Secondary | ICD-10-CM | POA: Diagnosis not present

## 2016-05-03 DIAGNOSIS — C434 Malignant melanoma of scalp and neck: Secondary | ICD-10-CM | POA: Diagnosis not present

## 2016-05-17 DIAGNOSIS — L905 Scar conditions and fibrosis of skin: Secondary | ICD-10-CM | POA: Diagnosis not present

## 2016-05-17 DIAGNOSIS — C44319 Basal cell carcinoma of skin of other parts of face: Secondary | ICD-10-CM | POA: Diagnosis not present

## 2016-08-09 ENCOUNTER — Ambulatory Visit (INDEPENDENT_AMBULATORY_CARE_PROVIDER_SITE_OTHER): Payer: Medicare Other | Admitting: Family Medicine

## 2016-08-09 VITALS — BP 104/62 | HR 75 | Temp 97.9°F | Resp 14 | Wt 178.0 lb

## 2016-08-09 DIAGNOSIS — I10 Essential (primary) hypertension: Secondary | ICD-10-CM

## 2016-08-09 DIAGNOSIS — R059 Cough, unspecified: Secondary | ICD-10-CM

## 2016-08-09 DIAGNOSIS — R05 Cough: Secondary | ICD-10-CM

## 2016-08-09 DIAGNOSIS — J40 Bronchitis, not specified as acute or chronic: Secondary | ICD-10-CM | POA: Diagnosis not present

## 2016-08-09 DIAGNOSIS — I25118 Atherosclerotic heart disease of native coronary artery with other forms of angina pectoris: Secondary | ICD-10-CM | POA: Diagnosis not present

## 2016-08-09 MED ORDER — AZITHROMYCIN 250 MG PO TABS: ORAL_TABLET | ORAL | 0 refills | Status: DC

## 2016-08-09 NOTE — Patient Instructions (Signed)
Try OTC Robitussin if needed

## 2016-08-09 NOTE — Progress Notes (Signed)
Subjective:  HPI  Patient has been sick for 2 weeks now. Started with sore throat and has moved down in his chest. He has chest congestion, dry cough which keeps him up at night sometimes, tickle in his throat. NO fever no chills or any other symptoms. He has not tried anything OTC. Symptoms are a little worse. His Parkinson's disease is stable and he is doing well with all his medications. No chest pain, no diaphoresis, no orthopnea Prior to Admission medications   Medication Sig Start Date End Date Taking? Authorizing Provider  amLODipine (NORVASC) 10 MG tablet Take 1 tablet (10 mg total) by mouth daily. 03/29/16  Yes Minna Merritts, MD  aspirin 162 MG EC tablet Take 162 mg by mouth daily.   Yes Historical Provider, MD  atorvastatin (LIPITOR) 80 MG tablet Take 1 tablet (80 mg total) by mouth daily at 6 PM. 03/29/16  Yes Minna Merritts, MD  benazepril (LOTENSIN) 40 MG tablet Take 1 tablet (40 mg total) by mouth daily. 03/29/16  Yes Minna Merritts, MD  carbidopa-levodopa (SINEMET IR) 25-100 MG per tablet Take 2 tablets by mouth 3 (three) times daily.  04/02/14  Yes Historical Provider, MD  Omega-3 Fatty Acids (OMEGA 3 PO) Take 1,400 mg by mouth daily.   Yes Historical Provider, MD    Patient Active Problem List   Diagnosis Date Noted  . Herpes zona 04/19/2016  . HZV (herpes zoster virus) post herpetic neuralgia 04/19/2016  . Umbilical hernia without obstruction and without gangrene 10/15/2014  . Hernia of abdominal cavity 10/15/2014  . PVD 10/02/2010  . WEIGHT GAIN 02/21/2010  . Hyperlipidemia 02/16/2010  . Essential hypertension 02/16/2010  . Old myocardial infarction 02/16/2010  . CAD, NATIVE VESSEL 02/16/2010  . Carotid stenosis 06/14/2009  . Carotid artery obstruction 06/14/2009  . PARKINSON'S DISEASE 05/12/2009    Past Medical History:  Diagnosis Date  . Acute myocardial infarction of other inferior wall, episode of care unspecified May 1994  . Cancer ([place])    skin    . Coronary atherosclerosis of native coronary artery   . History of shingles    waist area/right back side  . Occlusion and stenosis of carotid artery without mention of cerebral infarction   . Other and unspecified hyperlipidemia    Severe  . Parkinson's disease   . Peripheral vascular disease, unspecified ([place])   . Shingles August 2015  . Unspecified cerebral artery occlusion with cerebral infarction 2006   CVA  . Unspecified essential hypertension     Social History   Social History  . Marital status: Married    Spouse name: N/A  . Number of children: N/A  . Years of education: N/A   Occupational History  . [employer]    Full time   Social History Main Topics  . Smoking status: Never Smoker  . Smokeless tobacco: Never Used  . Alcohol use Yes     Comment: 3 glasses of wine/day  . Drug use: No  . Sexual activity: Not on file   Other Topics Concern  . Not on file   Social History Narrative   Married   Gets regular exercise    Allergies  Allergen Reactions  . Penicillins     Review of Systems  Constitutional: Negative.   Eyes: Negative.   Respiratory: Positive for cough.   Cardiovascular: Negative.   Gastrointestinal: Negative.   Genitourinary: Negative.   Musculoskeletal: Negative.   Skin: Negative.  Endo/Heme/Allergies: Negative.   Psychiatric/Behavioral: Negative.     Immunization History  Administered Date(s) Administered  . Pneumococcal Conjugate-13 04/24/2016  . Tdap 04/24/2016   Objective:  BP 104/62   Pulse 75   Temp 97.9 F (36.6 C)   Resp 14   Wt 178 lb (80.7 kg)   SpO2 99%   BMI 27.06 kg/m   Physical Exam  Constitutional: He is oriented to person, place, and time and well-developed, well-nourished, and in no distress.  HENT:  Head: Normocephalic and atraumatic.  Right Ear: External ear normal.  Left Ear: External ear normal.  Nose: Nose normal.  Mouth/Throat: Oropharynx is clear and moist.  Eyes:  Conjunctivae are normal. Pupils are equal, round, and reactive to light.  Neck: Normal range of motion. Neck supple.  Cardiovascular: Normal rate, regular rhythm, normal heart sounds and intact distal pulses.   No murmur heard. Pulmonary/Chest: Effort normal and breath sounds normal. No respiratory distress. He has no wheezes.  Abdominal: Soft.  Musculoskeletal: He exhibits no edema or tenderness.  Neurological: He is alert and oriented to person, place, and time.  Skin: Skin is warm and dry.  Psychiatric: Mood, memory, affect and judgment normal.    Lab Results  Component Value Date   WBC 6.7 04/24/2016   HGB 14.3 02/10/2015   HCT 43.2 04/24/2016   PLT 238 04/24/2016   GLUCOSE 98 04/24/2016   CHOL 171 04/24/2016   TRIG 62 04/24/2016   HDL 63 04/24/2016   LDLCALC 96 04/24/2016   TSH 0.789 04/24/2016   HGBA1C 5.5 04/24/2016    CMP     Component Value Date/Time   NA 138 04/24/2016 1000   K 5.0 04/24/2016 1000   CL 98 04/24/2016 1000   CO2 25 04/24/2016 1000   GLUCOSE 98 04/24/2016 1000   GLUCOSE 100 (H) 04/01/2012 0940   BUN 9 04/24/2016 1000   CREATININE 0.56 (L) 04/24/2016 1000   CALCIUM 9.2 04/24/2016 1000   PROT 7.2 04/24/2016 1000   ALBUMIN 4.4 04/24/2016 1000   AST 20 04/24/2016 1000   ALT 6 04/24/2016 1000   ALKPHOS 50 04/24/2016 1000   BILITOT 1.2 04/24/2016 1000   GFRNONAA 106 04/24/2016 1000   GFRAA 122 04/24/2016 1000    Assessment and Plan :  1. Bronchitis Will treat as bronchitis. Lungs are clear on the exam today. No need for chest xray at this time. Follow as needed.  2. Cough Could be coming from Lotensin b/p medication but will treat as bronchitis for now and if symptoms not better or resolved will re access.If not improving in a week we'll obtain chest x-ray and discontinue ACEI. 3. Parkinson's disease Do not think he has swallowing disorder --will follow for now 4. Hypertension 5. Hyperlipidemia  Patient was seen and examined by Dr. Eulas Post and note was scribed by Theressa Millard, [place].    Miguel Aschoff MD [facility] 08/09/2016 8:24 AM

## 2016-09-13 ENCOUNTER — Encounter: Payer: Medicare Other | Admitting: Family Medicine

## 2016-09-18 ENCOUNTER — Ambulatory Visit (INDEPENDENT_AMBULATORY_CARE_PROVIDER_SITE_OTHER): Payer: Medicare Other | Admitting: Family Medicine

## 2016-09-18 ENCOUNTER — Encounter: Payer: Self-pay | Admitting: Family Medicine

## 2016-09-18 VITALS — BP 118/62 | HR 78 | Temp 98.4°F | Resp 12 | Ht 67.5 in | Wt 176.0 lb

## 2016-09-18 DIAGNOSIS — N402 Nodular prostate without lower urinary tract symptoms: Secondary | ICD-10-CM

## 2016-09-18 DIAGNOSIS — Z0000 Encounter for general adult medical examination without abnormal findings: Secondary | ICD-10-CM | POA: Diagnosis not present

## 2016-09-18 DIAGNOSIS — I25118 Atherosclerotic heart disease of native coronary artery with other forms of angina pectoris: Secondary | ICD-10-CM

## 2016-09-18 DIAGNOSIS — G20 Parkinson's disease: Secondary | ICD-10-CM

## 2016-09-18 DIAGNOSIS — Z1211 Encounter for screening for malignant neoplasm of colon: Secondary | ICD-10-CM

## 2016-09-18 DIAGNOSIS — Z2821 Immunization not carried out because of patient refusal: Secondary | ICD-10-CM | POA: Diagnosis not present

## 2016-09-18 LAB — POCT URINALYSIS DIPSTICK
Bilirubin, UA: NEGATIVE
Glucose, UA: NEGATIVE
Ketones, UA: NEGATIVE
LEUKOCYTES UA: NEGATIVE
NITRITE UA: NEGATIVE
PH UA: 6
Protein, UA: NEGATIVE
RBC UA: NEGATIVE
Spec Grav, UA: 1.02
UROBILINOGEN UA: 0.2

## 2016-09-18 LAB — IFOBT (OCCULT BLOOD): IMMUNOLOGICAL FECAL OCCULT BLOOD TEST: NEGATIVE

## 2016-09-18 NOTE — Progress Notes (Signed)
Patient: [name] Faler., Male    DOB: Feb 23, 1946, 70 y.o.   MRN: [id number] Visit Date: 09/18/2016  Today's Provider: Wilhemena Durie, MD   Chief Complaint  Patient presents with  . Medicare Wellness   Subjective:   [name] Farrell. is a 70 y.o. male who presents today for his Subsequent Annual Wellness Visit. He feels well. He reports exercising 6 days aweek for 1 and 1/2 hours. He reports he is sleeping well.  Last Tdap 04/24/16  Prevnar 04/24/16 Never had colonoscopy. Declines flu shot.  Review of Systems  Constitutional: Negative.   HENT: Positive for ear discharge.   Eyes: Negative.   Respiratory: Negative.   Cardiovascular: Negative.   Gastrointestinal: Negative.   Endocrine: Negative.   Genitourinary: Positive for frequency.  Musculoskeletal: Negative.   Skin: Negative.   Allergic/Immunologic: Negative.   Neurological: Positive for tremors.       Patient has Parkinson's disease and has some difficulty with movement but is functioning well day-to-day  Hematological: Negative.   Psychiatric/Behavioral: Negative.     Patient Active Problem List   Diagnosis Date Noted  . Herpes zona 04/19/2016  . HZV (herpes zoster virus) post herpetic neuralgia 04/19/2016  . Umbilical hernia without obstruction and without gangrene 10/15/2014  . Hernia of abdominal cavity 10/15/2014  . PVD 10/02/2010  . WEIGHT GAIN 02/21/2010  . Hyperlipidemia 02/16/2010  . Essential hypertension 02/16/2010  . Old myocardial infarction 02/16/2010  . CAD, NATIVE VESSEL 02/16/2010  . Carotid stenosis 06/14/2009  . Carotid artery obstruction 06/14/2009  . PARKINSON'S DISEASE 05/12/2009    Social History   Social History  . Marital status: Married    Spouse name: N/A  . Number of children: N/A  . Years of education: N/A   Occupational History  . [employer]    Full time   Social History Main Topics  . Smoking status: Never Smoker  . Smokeless tobacco: Never Used   . Alcohol use Yes     Comment: 3 glasses of wine/day  . Drug use: No  . Sexual activity: Not on file   Other Topics Concern  . Not on file   Social History Narrative   Married   Gets regular exercise    Past Surgical History:  Procedure Laterality Date  . HERNIA REPAIR  02/17/15   right inguinal hernia , laparoscopic placement of [place] 3-D max mesh  . HERNIA REPAIR  3/316   ventral hernia, umbilical, open repair  . SKIN BIOPSY     skin cancer    His family history includes Heart disease in his father and other; Hypertension in his brother, mother, and sister.    Outpatient Encounter Prescriptions as of 09/18/2016  Medication Sig Note  . amLODipine (NORVASC) 10 MG tablet Take 1 tablet (10 mg total) by mouth daily.   Marland Kitchen aspirin 162 MG EC tablet Take 162 mg by mouth daily.   Marland Kitchen atorvastatin (LIPITOR) 80 MG tablet Take 1 tablet (80 mg total) by mouth daily at 6 PM.   . benazepril (LOTENSIN) 40 MG tablet Take 1 tablet (40 mg total) by mouth daily.   . carbidopa-levodopa (SINEMET IR) 25-100 MG per tablet Take 2 tablets by mouth 3 (three) times daily.  05/17/2014: Received from: External Pharmacy  . Omega-3 Fatty Acids (OMEGA 3 PO) Take 1,400 mg by mouth daily.   . [DISCONTINUED] azithromycin (ZITHROMAX) 250 MG tablet As directed    No facility-administered encounter medications on file as  of 09/18/2016.     Allergies  Allergen Reactions  . Penicillins     Patient Care Team: Jerrol Banana., MD as PCP - General (Family Medicine) Jerrol Banana., MD (Family Medicine) Robert Bellow, MD (General Surgery) Minna Merritts, MD as Consulting Physician (Cardiology)  Objective:   Vitals:  Vitals:   09/18/16 0943  BP: 118/62  Pulse: 78  Resp: 12  Temp: 98.4 F (36.9 C)  Weight: 176 lb (79.8 kg)  Height: 5' 7.5" (1.715 m)    Physical Exam  Constitutional: He is oriented to person, place, and time. He appears well-developed and well-nourished.  HENT:  Head:  Normocephalic and atraumatic.  Right Ear: External ear normal.  Left Ear: External ear normal.  Mouth/Throat: Oropharynx is clear and moist.  Eyes: Conjunctivae are normal. Pupils are equal, round, and reactive to light.  Neck: Normal range of motion. Neck supple.  Cardiovascular: Normal rate, regular rhythm, normal heart sounds and intact distal pulses.   No murmur heard. Pulmonary/Chest: Effort normal and breath sounds normal. No respiratory distress. He has no wheezes.  Abdominal: Soft. There is no tenderness. There is no rebound.  Genitourinary: Rectum normal and penis normal. Rectal exam shows guaiac negative stool. No penile tenderness.  Genitourinary Comments: Small prostate nodule noted on the left lobe . Smaller nodule on the posterior right lobe.  Neurological: He is alert and oriented to person, place, and time. No cranial nerve deficit.  Mild stigmata of Parkinson's disease with masked facies.  Skin: Skin is warm and dry. No rash noted. No erythema.  Patient has a lot of sun damaged skin on the face and forearms and he is followed by dermatology.  Psychiatric: He has a normal mood and affect. His behavior is normal. Judgment and thought content normal.    Activities of Daily Living In your present state of health, do you have any difficulty performing the following activities: 09/18/2016 04/24/2016  Hearing? N N  Vision? N N  Difficulty concentrating or making decisions? Y N  Walking or climbing stairs? N N  Dressing or bathing? N N  Doing errands, shopping? N N  Some recent data might be hidden    Fall Risk Assessment Fall Risk  09/18/2016 04/24/2016  Falls in the past year? No No     Depression Screen PHQ 2/9 Scores 09/18/2016 04/24/2016  PHQ - 2 Score 0 0    Cognitive Testing - 6-CIT    Year: 0 4 points  Month: 0 3 points  Memorize "Pia Mau, [address] [place]"  Time (within 1 hour:) 0 3 points  Count backwards from 20: 0 2 4 points  Name months of year: 0 2  4 points  Repeat Address: 0 2 4 6 8 10  points   Total Score: 3/28  Interpretation : Normal (0-7) Abnormal (8-28)    Assessment & Plan:     Annual Wellness Visit  Reviewed patient's Family Medical History Reviewed and updated list of patient's medical providers Assessment of cognitive impairment was done Assessed patient's functional ability Established a written schedule for health screening [place] Completed and Reviewed  1. Medicare annual wellness visit, subsequent  2. Colon cancer screening Refer for colonoscopy. - Ambulatory referral to Gastroenterology  3. Influenza vaccination declined  4. Prostate nodule - POCT urinalysis dipstick - PSA - Ambulatory referral to Urology Obvious concern is that nodules are prostate cancer. I am hopeful that my exam is inaccurate. Discussed referral with  patient and he is agreeable. 5.  Parkinson's disease Per neurology 6. Skin cancers Followed by dermatology 7. CAD All risk factors treated 8. Carotid disease  HPI, Exam and A&P transcribed under direction and in the presence of Miguel Aschoff, MD.

## 2016-09-19 ENCOUNTER — Telehealth: Payer: Self-pay | Admitting: Family Medicine

## 2016-09-19 LAB — PSA: Prostate Specific Ag, Serum: 4.6 ng/mL — ABNORMAL HIGH (ref 0.0–4.0)

## 2016-09-19 NOTE — Telephone Encounter (Signed)
Pt is returning call.  [id number]

## 2016-09-19 NOTE — Telephone Encounter (Signed)
Pt is returning call.  [id number]

## 2016-09-20 NOTE — Telephone Encounter (Signed)
Pt advised on voicemail-aa 

## 2016-09-25 DIAGNOSIS — G20 Parkinson's disease: Secondary | ICD-10-CM | POA: Diagnosis not present

## 2016-10-05 ENCOUNTER — Ambulatory Visit (INDEPENDENT_AMBULATORY_CARE_PROVIDER_SITE_OTHER): Payer: Medicare Other | Admitting: Urology

## 2016-10-05 ENCOUNTER — Telehealth: Payer: Self-pay | Admitting: Urology

## 2016-10-05 ENCOUNTER — Encounter: Payer: Self-pay | Admitting: Urology

## 2016-10-05 VITALS — BP 127/75 | HR 69 | Ht 68.0 in | Wt 176.0 lb

## 2016-10-05 DIAGNOSIS — R3915 Urgency of urination: Secondary | ICD-10-CM

## 2016-10-05 DIAGNOSIS — R972 Elevated prostate specific antigen [PSA]: Secondary | ICD-10-CM | POA: Diagnosis not present

## 2016-10-05 DIAGNOSIS — R350 Frequency of micturition: Secondary | ICD-10-CM | POA: Diagnosis not present

## 2016-10-05 DIAGNOSIS — N402 Nodular prostate without lower urinary tract symptoms: Secondary | ICD-10-CM | POA: Diagnosis not present

## 2016-10-05 DIAGNOSIS — I25118 Atherosclerotic heart disease of native coronary artery with other forms of angina pectoris: Secondary | ICD-10-CM

## 2016-10-05 NOTE — Patient Instructions (Signed)

## 2016-10-05 NOTE — Progress Notes (Signed)
10/05/2016 5:51 PM   [name] [name]. 05/02/1946 [id number]  Referring provider: Jerrol Banana., MD [address] [place], [place] [address]  Chief Complaint  Patient presents with  . New Patient (Initial Visit)    Prostate Nodule  . Elevated PSA    HPI: 70 yo referred for elevated PSA and prostate nodule on recent prostate exam by Dr. Rosanna Randy.  His PSA was found to be 4.6 on 09/2016.  No family history of prostate cancer.  No significant bone pain.  He has been actively trying to lose weight.    He does complain today about urinary urgency and occasional urge incontinence.  He does have frequency when he drinks colas.   Nocturia x 1-2.  His stream is OK and he does feel that he is able to empty his bladder.   PMHx significant for Parkinson's diease (dx 14 years ago without much progression) and MI in '94 on ASA treated medically (followed Dr. Rockey Situ).         IPSS    Row Name 10/05/16 1100         International Prostate Symptom Score   How often have you had the sensation of not emptying your bladder? Less than half the time     How often have you had to urinate less than every two hours? About half the time     How often have you found you stopped and started again several times when you urinated? Less than half the time     How often have you found it difficult to postpone urination? About half the time     How often have you had a weak urinary stream? Less than half the time     How often have you had to strain to start urination? Less than 1 in 5 times     How many times did you typically get up at night to urinate? 2 Times     Total IPSS Score 15       Quality of Life due to urinary symptoms   If you were to spend the rest of your life with your urinary condition just the way it is now how would you feel about that? Mixed        Score:  1-7 Mild 8-19 Moderate 20-35 Severe    PMH: Past Medical History:  Diagnosis Date  . Acute  myocardial infarction of other inferior wall, episode of care unspecified May 1994  . Cancer ([place])    skin  . Coronary atherosclerosis of native coronary artery   . History of shingles    waist area/right back side  . Occlusion and stenosis of carotid artery without mention of cerebral infarction   . Other and unspecified hyperlipidemia    Severe  . Parkinson's disease   . Peripheral vascular disease, unspecified   . Shingles August 2015  . Unspecified cerebral artery occlusion with cerebral infarction 2006   CVA  . Unspecified essential hypertension     Surgical History: Past Surgical History:  Procedure Laterality Date  . HERNIA REPAIR  02/17/15   right inguinal hernia , laparoscopic placement of Bard 3-D max mesh  . HERNIA REPAIR  3/316   ventral hernia, umbilical, open repair  . SKIN BIOPSY     skin cancer    Home Medications:    Medication List       Accurate as of 10/05/16  5:51 PM. Always use your most recent med list.  amLODipine 10 MG tablet Commonly known as:  NORVASC Take 1 tablet (10 mg total) by mouth daily.   aspirin 162 MG EC tablet Take 162 mg by mouth daily.   atorvastatin 80 MG tablet Commonly known as:  LIPITOR Take 1 tablet (80 mg total) by mouth daily at 6 PM.   benazepril 40 MG tablet Commonly known as:  LOTENSIN Take 1 tablet (40 mg total) by mouth daily.   carbidopa-levodopa 25-100 MG tablet Commonly known as:  SINEMET IR Take 2 tablets by mouth 3 (three) times daily.   OMEGA 3 PO Take 1,400 mg by mouth daily.       Allergies:  Allergies  Allergen Reactions  . Penicillins     Family History: Family History  Problem Relation Age of Onset  . Hypertension Mother   . Heart disease Father   . Hypertension Sister   . Hypertension Brother   . Heart disease Other   . Prostate cancer Neg Hx   . Kidney cancer Neg Hx     Social History:  reports that he has never smoked. He has never used smokeless tobacco. He reports  that he drinks alcohol. He reports that he does not use drugs.  ROS: UROLOGY Frequent Urination?: Yes Hard to postpone urination?: Yes Burning/pain with urination?: No Get up at night to urinate?: Yes Leakage of urine?: No Urine stream starts and stops?: No Trouble starting stream?: No Do you have to strain to urinate?: No Blood in urine?: No Urinary tract infection?: No Sexually transmitted disease?: No Injury to kidneys or bladder?: No Painful intercourse?: No Weak stream?: No Erection problems?: No Penile pain?: No  Gastrointestinal Nausea?: No Vomiting?: No Indigestion/heartburn?: No Diarrhea?: No Constipation?: Yes  Constitutional Fever: No Night sweats?: No Weight loss?: No Fatigue?: No  Skin Skin rash/lesions?: No Itching?: No  Eyes Blurred vision?: No Double vision?: No  Ears/Nose/Throat Sore throat?: No Sinus problems?: No  Hematologic/Lymphatic Swollen glands?: No Easy bruising?: No  Cardiovascular Leg swelling?: No Chest pain?: No  Respiratory Cough?: Yes Shortness of breath?: No  Endocrine Excessive thirst?: No  Musculoskeletal Back pain?: No Joint pain?: No  Neurological Headaches?: No Dizziness?: No  Psychologic Depression?: No Anxiety?: No  Physical Exam: BP 127/75   Pulse 69   Ht 5\' 8"  (1.727 m)   Wt 176 lb (79.8 kg)   BMI 26.76 kg/m   Constitutional:  Alert and oriented, No acute distress. HEENT: [place] AT, moist mucus membranes.  Trachea midline, no masses. Cardiovascular: No clubbing, cyanosis, or edema. Respiratory: Normal respiratory effort, no increased work of breathing. GI: Abdomen is soft, nontender, nondistended, no abdominal masses GU: No CVA tenderness.  Rectal: Normal sphincter tone.  40 cc prostate with left firm nodule (mid gland). Skin: No rashes, bruises or suspicious lesions. Lymph: No cervical or inguinal adenopathy. Neurologic: Grossly intact, no focal deficits, moving all 4  extremities. Psychiatric: Normal mood and affect.  Laboratory Data: Lab Results  Component Value Date   WBC 6.7 04/24/2016   HGB 14.3 02/10/2015   HCT 43.2 04/24/2016   MCV 92 04/24/2016   PLT 238 04/24/2016    Lab Results  Component Value Date   CREATININE 0.56 (L) 04/24/2016     Lab Results  Component Value Date   HGBA1C 5.5 04/24/2016   Component     Latest Ref Rng & Units 09/18/2016  PSA     0.0 - 4.0 ng/mL 4.6 (H)    Urinalysis Results for orders placed or performed in  visit on 09/18/16  PSA  Result Value Ref Range   Prostate Specific Ag, Serum 4.6 (H) 0.0 - 4.0 ng/mL  POCT urinalysis dipstick  Result Value Ref Range   Color, UA yellow    Clarity, UA clear    Glucose, UA negative    Bilirubin, UA negative    Ketones, UA negative    Spec Grav, UA 1.020    Blood, UA negative    pH, UA 6.0    Protein, UA negative    Urobilinogen, UA 0.2    Nitrite, UA negative    Leukocytes, UA Negative Negative  IFOBT POC (occult bld, rslt in office)  Result Value Ref Range   IFOBT Negative     Pertinent Imaging: n/a  Assessment & Plan:    1. Elevated PSA Mildly elevated PSA with suspicious and concerning left mid gland nodule today. Differential diagnosis including prostate cancer was discussed in detail.  I have recommended a prostate biopsy for further evaluation.  We discussed prostate biopsy in detail including the procedure itself, the risks of blood in the urine, stool, and ejaculate, serious infection, and discomfort. He is willing to proceed with this as discussed.  All of his questions were answered. He is willing to proceed as planned.  We will containing cardiac clearance from Dr.Gollan ensure that he is able to safely stop his aspirin for the procedure.  2. Prostate nodule As above  3. Urinary frequency Discussed behavioral modification at length. We also discussed that urinary urgency and frequency can be a sign/symptom of Parkinson's disease and  how it can affect the bladder.  If patient remains symptomatic despite behavioral modification, may consider anticholinergic in the future.  4. Urinary urgency As above    Return in about 4 weeks (around 11/02/2016) for schedule prostate biopsy.  Hollice Espy, MD  [facility] [address], [place],  [address] [phone]

## 2016-10-05 NOTE — Telephone Encounter (Signed)
[name] [name] is a patient of yours on aspirin for history of CAD. He needs a prostate biopsy and we generally recommend holding the aspirin for 7 days.  He was last seen in your office in April 2017.  We are seeking cardiac clearance for him to hold his aspirin for this procedure.  Please let us know if he can hold this medication.  Hollice Espy, MD

## 2016-10-07 NOTE — Telephone Encounter (Signed)
He should be ok to hold the aspirin 7 days He has been very stable over the years He may want to stop the fish oils as well, this can also act like a blood thinner in some cases

## 2016-10-08 NOTE — Telephone Encounter (Signed)
Left patient a message to call/SW

## 2016-10-09 NOTE — Telephone Encounter (Signed)
Patient was notified that he is ok to stop ASA 7 days prior to the biopsy. Biopsy instructions were explained in detail with the patient over the phone, he verbalized understanding. A instruction sheet with apt dates and times was also mailed to the patient as a reminder. [name] [name]

## 2016-10-17 DIAGNOSIS — C61 Malignant neoplasm of prostate: Secondary | ICD-10-CM

## 2016-10-17 HISTORY — DX: Malignant neoplasm of prostate: C61

## 2016-10-31 ENCOUNTER — Ambulatory Visit (INDEPENDENT_AMBULATORY_CARE_PROVIDER_SITE_OTHER): Payer: Medicare Other | Admitting: Urology

## 2016-10-31 ENCOUNTER — Other Ambulatory Visit: Payer: Self-pay | Admitting: Urology

## 2016-10-31 VITALS — BP 134/82 | HR 57 | Ht 68.0 in | Wt 173.0 lb

## 2016-10-31 DIAGNOSIS — C61 Malignant neoplasm of prostate: Secondary | ICD-10-CM | POA: Diagnosis not present

## 2016-10-31 DIAGNOSIS — R972 Elevated prostate specific antigen [PSA]: Secondary | ICD-10-CM | POA: Diagnosis not present

## 2016-10-31 DIAGNOSIS — N402 Nodular prostate without lower urinary tract symptoms: Secondary | ICD-10-CM

## 2016-10-31 DIAGNOSIS — N4289 Other specified disorders of prostate: Secondary | ICD-10-CM | POA: Diagnosis not present

## 2016-10-31 DIAGNOSIS — N4232 Atypical small acinar proliferation of prostate: Secondary | ICD-10-CM | POA: Diagnosis not present

## 2016-10-31 MED ORDER — LEVOFLOXACIN 500 MG PO TABS
500.0000 mg | ORAL_TABLET | Freq: Once | ORAL | Status: AC
  Administered 2016-10-31: 500 mg via ORAL

## 2016-10-31 MED ORDER — GENTAMICIN SULFATE 40 MG/ML IJ SOLN
80.0000 mg | Freq: Once | INTRAMUSCULAR | Status: AC
  Administered 2016-10-31: 80 mg via INTRAMUSCULAR

## 2016-10-31 NOTE — Progress Notes (Signed)
   10/31/16  CC:  Chief Complaint  Patient presents with  . Prostate Biopsy    HPI: 70 year old male who presents today for prostate biopsy. He is found to have a left mid gland nodule on rectal exam and a PSA 4.6 on 09/2016.  Blood pressure 134/82, pulse (!) 57, height 5\' 8"  (1.727 m), weight 173 lb (78.5 kg). NED. A&Ox3.   No respiratory distress   Abd soft, NT, ND  Prostate Biopsy Procedure   Informed consent was obtained after discussing risks/benefits of the procedure.  A time out was performed to ensure correct patient identity.  Pre-Procedure: - Gentamicin given prophylactically - Levaquin 500 mg administered PO -Transrectal Ultrasound performed revealing a 44 gm prostate -No significant hypoechoic or median lobe noted  Procedure: - Prostate block performed using 10 cc 1% lidocaine and biopsies taken from sextant areas, a total of 12 under ultrasound guidance.  An additional biopsy of the left mid prostate gland was sampled today given the presence of a nodule in this area and sent together in one packet.  Post-Procedure: - Patient tolerated the procedure well - He was counseled to seek immediate medical attention if experiences any severe pain, significant bleeding, or fevers - Return in one week to discuss biopsy results   Assessment/ Plan:  1. Elevated PSA As above s/p biopsy - gentamicin (GARAMYCIN) injection 80 mg; Inject 2 mLs (80 mg total) into the muscle once. - levofloxacin (LEVAQUIN) tablet 500 mg; Take 1 tablet (500 mg total) by mouth once.  2. Prostate nodule  Follow up as scheduled for prostate biopsy results   Hollice Espy, MD

## 2016-11-06 LAB — PATHOLOGY REPORT

## 2016-11-13 ENCOUNTER — Other Ambulatory Visit: Payer: Self-pay | Admitting: Urology

## 2016-11-14 ENCOUNTER — Ambulatory Visit (INDEPENDENT_AMBULATORY_CARE_PROVIDER_SITE_OTHER): Payer: Medicare Other | Admitting: Urology

## 2016-11-14 ENCOUNTER — Encounter: Payer: Self-pay | Admitting: Urology

## 2016-11-14 VITALS — BP 151/91 | HR 68 | Ht 68.0 in | Wt 173.0 lb

## 2016-11-14 DIAGNOSIS — I25118 Atherosclerotic heart disease of native coronary artery with other forms of angina pectoris: Secondary | ICD-10-CM

## 2016-11-14 DIAGNOSIS — C61 Malignant neoplasm of prostate: Secondary | ICD-10-CM

## 2016-11-14 NOTE — Progress Notes (Signed)
11/14/2016 3:18 PM   [name] [name]. [birth year] [id number]  Referring provider: Jerrol Banana., MD [address] [place], [place] [address]  Chief Complaint  Patient presents with  . Prostate Cancer    Bx results    HPI: 70 yo M initially referred for elevated PSA , prostate nodule on recent prostate exam by Dr. Rosanna Randy.  His PSA was found to be 4.6 on 09/2016.  He did indeed have a left-sided firm nodule in the mid gland.  Prostate biopsy on 10/31/16 shows Gleason 3+3 in 6/12 cores, 4 on left and 2 on right up to 58%.  No family history of prostate cancer.  No significant bone pain.  He has been actively trying to lose weight.    He does complain today about urinary urgency and occasional urge incontinence.  He does have frequency when he drinks colas.   Nocturia x 1-2.  His stream is OK and he does feel that he is able to empty his bladder.   PMHx significant for Parkinson's diease (dx 14 years ago without much progression) and MI in '94 on ASA treated medically (followed Dr. Rockey Situ).        IPSS    Row Name 10/05/16 1100 11/14/16 1600       International Prostate Symptom Score   How often have you had the sensation of not emptying your bladder? Less than half the time Less than 1 in 5    How often have you had to urinate less than every two hours? About half the time Less than half the time    How often have you found you stopped and started again several times when you urinated? Less than half the time Less than 1 in 5 times    How often have you found it difficult to postpone urination? About half the time About half the time    How often have you had a weak urinary stream? Less than half the time Less than 1 in 5 times    How often have you had to strain to start urination? Less than 1 in 5 times Not at All    How many times did you typically get up at night to urinate? 2 Times 2 Times    Total IPSS Score 15 10      Quality of Life due to  urinary symptoms   If you were to spend the rest of your life with your urinary condition just the way it is now how would you feel about that? Mixed Mostly Satisfied      Score:  1-7 Mild 8-19 Moderate 20-35 Severe      SHIM    Row Name 11/14/16 1610         SHIM: Over the last 6 months:   How do you rate your confidence that you could get and keep an erection? Moderate     When you had erections with sexual stimulation, how often were your erections hard enough for penetration (entering your partner)? Most Times (much more than half the time)     During sexual intercourse, how often were you able to maintain your erection after you had penetrated (entered) your partner? Slightly Difficult     During sexual intercourse, how difficult was it to maintain your erection to completion of intercourse? Slightly Difficult     When you attempted sexual intercourse, how often was it satisfactory for you? Slightly Difficult  SHIM Total Score   SHIM 19          PMH: Past Medical History:  Diagnosis Date  . Acute myocardial infarction of other inferior wall, episode of care unspecified May 1994  . Cancer ([place])    skin  . Coronary atherosclerosis of native coronary artery   . History of shingles    waist area/right back side  . Occlusion and stenosis of carotid artery without mention of cerebral infarction   . Other and unspecified hyperlipidemia    Severe  . Parkinson's disease   . Peripheral vascular disease, unspecified   . Shingles August 2015  . Unspecified cerebral artery occlusion with cerebral infarction 2006   CVA  . Unspecified essential hypertension     Surgical History: Past Surgical History:  Procedure Laterality Date  . HERNIA REPAIR  02/17/15   right inguinal hernia , laparoscopic placement of Bard 3-D max mesh  . HERNIA REPAIR  3/316   ventral hernia, umbilical, open repair  . SKIN BIOPSY     skin cancer    Home Medications:    Medication List         Accurate as of 11/14/16 11:59 PM. Always use your most recent med list.          amLODipine 10 MG tablet Commonly known as:  NORVASC Take 1 tablet (10 mg total) by mouth daily.   aspirin 162 MG EC tablet Take 162 mg by mouth daily.   atorvastatin 80 MG tablet Commonly known as:  LIPITOR Take 1 tablet (80 mg total) by mouth daily at 6 PM.   benazepril 40 MG tablet Commonly known as:  LOTENSIN Take 1 tablet (40 mg total) by mouth daily.   carbidopa-levodopa 25-100 MG tablet Commonly known as:  SINEMET IR Take 2 tablets by mouth 3 (three) times daily.   OMEGA 3 PO Take 1,400 mg by mouth daily.       Allergies:  Allergies  Allergen Reactions  . Penicillins     Family History: Family History  Problem Relation Age of Onset  . Hypertension Mother   . Heart disease Father   . Hypertension Sister   . Hypertension Brother   . Heart disease Other   . Prostate cancer Neg Hx   . Kidney cancer Neg Hx     Social History:  reports that he has never smoked. He has never used smokeless tobacco. He reports that he drinks alcohol. He reports that he does not use drugs.  ROS: UROLOGY Frequent Urination?: Yes Hard to postpone urination?: Yes Burning/pain with urination?: No Get up at night to urinate?: Yes Leakage of urine?: No Urine stream starts and stops?: No Trouble starting stream?: No Do you have to strain to urinate?: No Blood in urine?: No Urinary tract infection?: No Sexually transmitted disease?: No Injury to kidneys or bladder?: No Painful intercourse?: No Weak stream?: No Erection problems?: No Penile pain?: No  Gastrointestinal Nausea?: No Vomiting?: No Indigestion/heartburn?: No Diarrhea?: No Constipation?: Yes  Constitutional Fever: No Night sweats?: No Weight loss?: No Fatigue?: No  Skin Skin rash/lesions?: No Itching?: No  Eyes Blurred vision?: No Double vision?: No  Ears/Nose/Throat Sore throat?: No Sinus problems?:  No  Hematologic/Lymphatic Swollen glands?: No Easy bruising?: No  Cardiovascular Leg swelling?: No Chest pain?: No  Respiratory Cough?: No Shortness of breath?: No  Endocrine Excessive thirst?: No  Musculoskeletal Back pain?: No Joint pain?: No  Neurological Headaches?: No Dizziness?: No  Psychologic Depression?: No Anxiety?: No  Physical Exam: BP (!) 151/91   Pulse 68   Ht 5\' 8"  (1.727 m)   Wt 173 lb (78.5 kg)   BMI 26.30 kg/m   Constitutional:  Alert and oriented, No acute distress. HEENT: [place] AT, moist mucus membranes.  Trachea midline, no masses. Cardiovascular: No clubbing, cyanosis, or edema. Respiratory: Normal respiratory effort, no increased work of breathing. GI: Abdomen is soft, nontender, nondistended, no abdominal masses GU: No CVA tenderness.  Skin: No rashes, bruises or suspicious lesions. Neurologic: Grossly intact, no focal deficits, moving all 4 extremities. Psychiatric: Normal mood and affect.  Laboratory Data: Lab Results  Component Value Date   WBC 6.7 04/24/2016   HGB 14.3 02/10/2015   HCT 43.2 04/24/2016   MCV 92 04/24/2016   PLT 238 04/24/2016    Lab Results  Component Value Date   CREATININE 0.56 (L) 04/24/2016      Lab Results  Component Value Date   HGBA1C 5.5 04/24/2016   Component     Latest Ref Rng & Units 09/18/2016  PSA     0.0 - 4.0 ng/mL 4.6 (H)    Urinalysis    Component Value Date/Time   BILIRUBINUR negative 09/18/2016 1029   PROTEINUR negative 09/18/2016 1029   UROBILINOGEN 0.2 09/18/2016 1029   NITRITE negative 09/18/2016 1029   LEUKOCYTESUR Negative 09/18/2016 1029     Assessment & Plan:    1. Prostate cancer ([place]) cT2a Gleason 3+3 prostate cancer, low risk per NCCN guidelines  The patient was counseled about the natural history of prostate cancer and the standard treatment options that are available for prostate cancer. It was explained to him how his age and life expectancy, clinical stage,  Gleason score, and PSA affect his prognosis, the decision to proceed with additional staging studies, as well as how that information influences recommended treatment strategies. We discussed the roles for active surveillance, radiation therapy, surgical therapy, androgen deprivation, as well as ablative therapy options for the treatment of prostate cancer as appropriate to his individual cancer situation. We discussed the risks and benefits of these options with regard to their impact on cancer control and also in terms of potential adverse events, complications, and impact on quality of life particularly related to urinary, bowel, and sexual function. The patient was encouraged to ask questions throughout the discussion today and all questions were answered to his stated satisfaction. In addition, the patient was providedwith and/or directed to appropriate resources and literature for further education about prostate cancer treatment options.  Given his age and multiple medical comorbidities including Parkinson's disease, I do not feel that he is a good candidate for surgery.   I have recommended strong consideration of active surveillance which is quite reasonable in this critical situation. Alternatively, radiation and brachytherapy were also discussed in detail. He was offered radiation oncology consult but declined at this time.  He  would like to continue to proceed with active surveillance. I recommended follow-up with PSA/digital rectal exam every 3-4 months with confirmatory prostate biopsy at 1 year after diagnosis unless his PSA starts to rise or his clinical exam changes. He is agreeable with this plan. Importance of close in routine follow-up was stressed today.  All questions were answered.   Return in about 4 months (around 03/14/2017) for PSA/ DRE.  Hollice Espy, MD  [facility] [address] [place], [place] [address] [phone]  I spent 30  min with this patient of which greater than 50% was spent in counseling  and coordination of care with the patient.

## 2016-11-23 DIAGNOSIS — I10 Essential (primary) hypertension: Secondary | ICD-10-CM | POA: Diagnosis not present

## 2016-11-23 DIAGNOSIS — Z1211 Encounter for screening for malignant neoplasm of colon: Secondary | ICD-10-CM | POA: Diagnosis not present

## 2016-11-26 ENCOUNTER — Telehealth: Payer: Self-pay | Admitting: Cardiovascular Disease

## 2016-11-26 NOTE — Telephone Encounter (Signed)
Received cardiac clearance request for pt to proceed w/ colonoscopy on 02/11/17 w/ Dr. Vira Agar w/ monitored anesthesia. Please route clearance to [facility] @ [phone].

## 2016-11-27 NOTE — Telephone Encounter (Signed)
Acceptable risk for surgery Would stop fish oil several days prior to procedure

## 2016-11-27 NOTE — Telephone Encounter (Signed)
Routed to fax # provided. 

## 2016-12-06 DIAGNOSIS — L249 Irritant contact dermatitis, unspecified cause: Secondary | ICD-10-CM | POA: Diagnosis not present

## 2016-12-06 DIAGNOSIS — D044 Carcinoma in situ of skin of scalp and neck: Secondary | ICD-10-CM | POA: Diagnosis not present

## 2016-12-06 DIAGNOSIS — D485 Neoplasm of uncertain behavior of skin: Secondary | ICD-10-CM | POA: Diagnosis not present

## 2016-12-06 DIAGNOSIS — L570 Actinic keratosis: Secondary | ICD-10-CM | POA: Diagnosis not present

## 2016-12-06 DIAGNOSIS — Z85820 Personal history of malignant melanoma of skin: Secondary | ICD-10-CM | POA: Diagnosis not present

## 2016-12-06 DIAGNOSIS — Z85828 Personal history of other malignant neoplasm of skin: Secondary | ICD-10-CM | POA: Diagnosis not present

## 2016-12-28 DIAGNOSIS — D044 Carcinoma in situ of skin of scalp and neck: Secondary | ICD-10-CM | POA: Diagnosis not present

## 2016-12-31 ENCOUNTER — Telehealth: Payer: Self-pay | Admitting: Cardiovascular Disease

## 2016-12-31 NOTE — Telephone Encounter (Signed)
Lmom to call our office for a Carotid doppler (1 yr f/u).

## 2017-02-11 ENCOUNTER — Ambulatory Visit: Payer: Medicare Other | Admitting: Anesthesiology

## 2017-02-11 ENCOUNTER — Encounter: Payer: Self-pay | Admitting: *Deleted

## 2017-02-11 ENCOUNTER — Encounter
Admission: RE | Disposition: A | Discharge status: Home / Self Care | Payer: Self-pay | Source: Ambulatory Visit | Attending: Unknown Physician Specialty

## 2017-02-11 ENCOUNTER — Ambulatory Visit
Admission: RE | Admit: 2017-02-11 | Discharge: 2017-02-11 | Disposition: A | Discharge status: Home / Self Care | Payer: Medicare Other | Source: Ambulatory Visit | Attending: Unknown Physician Specialty | Admitting: Unknown Physician Specialty

## 2017-02-11 DIAGNOSIS — Z1211 Encounter for screening for malignant neoplasm of colon: Secondary | ICD-10-CM | POA: Insufficient documentation

## 2017-02-11 DIAGNOSIS — E785 Hyperlipidemia, unspecified: Secondary | ICD-10-CM | POA: Diagnosis not present

## 2017-02-11 DIAGNOSIS — K5730 Diverticulosis of large intestine without perforation or abscess without bleeding: Secondary | ICD-10-CM | POA: Insufficient documentation

## 2017-02-11 DIAGNOSIS — K648 Other hemorrhoids: Secondary | ICD-10-CM | POA: Diagnosis not present

## 2017-02-11 DIAGNOSIS — Z7982 Long term (current) use of aspirin: Secondary | ICD-10-CM | POA: Diagnosis not present

## 2017-02-11 DIAGNOSIS — Z79899 Other long term (current) drug therapy: Secondary | ICD-10-CM | POA: Diagnosis not present

## 2017-02-11 DIAGNOSIS — K635 Polyp of colon: Secondary | ICD-10-CM | POA: Diagnosis not present

## 2017-02-11 DIAGNOSIS — D123 Benign neoplasm of transverse colon: Secondary | ICD-10-CM | POA: Diagnosis not present

## 2017-02-11 DIAGNOSIS — I2510 Atherosclerotic heart disease of native coronary artery without angina pectoris: Secondary | ICD-10-CM | POA: Diagnosis not present

## 2017-02-11 DIAGNOSIS — I10 Essential (primary) hypertension: Secondary | ICD-10-CM | POA: Diagnosis not present

## 2017-02-11 DIAGNOSIS — G20 Parkinson's disease: Secondary | ICD-10-CM | POA: Diagnosis not present

## 2017-02-11 DIAGNOSIS — I252 Old myocardial infarction: Secondary | ICD-10-CM | POA: Insufficient documentation

## 2017-02-11 DIAGNOSIS — K5790 Diverticulosis of intestine, part unspecified, without perforation or abscess without bleeding: Secondary | ICD-10-CM | POA: Diagnosis not present

## 2017-02-11 HISTORY — PX: COLONOSCOPY WITH PROPOFOL: SHX5780

## 2017-02-11 SURGERY — COLONOSCOPY WITH PROPOFOL
Anesthesia: General

## 2017-02-11 MED ORDER — FENTANYL CITRATE (PF) 100 MCG/2ML IJ SOLN
INTRAMUSCULAR | Status: DC | PRN
  Administered 2017-02-11: 50 ug via INTRAVENOUS

## 2017-02-11 MED ORDER — FENTANYL CITRATE (PF) 100 MCG/2ML IJ SOLN
INTRAMUSCULAR | Status: AC
  Filled 2017-02-11: qty 2

## 2017-02-11 MED ORDER — PROPOFOL 500 MG/50ML IV EMUL
INTRAVENOUS | Status: DC | PRN
  Administered 2017-02-11: 120 ug/kg/min via INTRAVENOUS

## 2017-02-11 MED ORDER — SODIUM CHLORIDE 0.9 % IV SOLN: INTRAVENOUS | Status: DC

## 2017-02-11 MED ORDER — MIDAZOLAM HCL 2 MG/2ML IJ SOLN
INTRAMUSCULAR | Status: AC
  Filled 2017-02-11: qty 2

## 2017-02-11 MED ORDER — PROPOFOL 500 MG/50ML IV EMUL
INTRAVENOUS | Status: AC
  Filled 2017-02-11: qty 50

## 2017-02-11 MED ORDER — SODIUM CHLORIDE 0.9 % IV SOLN
INTRAVENOUS | Status: DC
  Administered 2017-02-11: 1000 mL via INTRAVENOUS

## 2017-02-11 MED ORDER — EPHEDRINE SULFATE 50 MG/ML IJ SOLN
INTRAMUSCULAR | Status: DC | PRN
  Administered 2017-02-11: 5 mg via INTRAVENOUS

## 2017-02-11 MED ORDER — MIDAZOLAM HCL 2 MG/2ML IJ SOLN
INTRAMUSCULAR | Status: DC | PRN
  Administered 2017-02-11: 1 mg via INTRAVENOUS

## 2017-02-11 NOTE — Anesthesia Post-op Follow-up Note (Cosign Needed)
Anesthesia QCDR form completed.        

## 2017-02-11 NOTE — Anesthesia Postprocedure Evaluation (Deleted)
Anesthesia Post Note  Patient: [name] [name].  Procedure(s) Performed: Procedure(s) (LRB): COLONOSCOPY WITH PROPOFOL (N/A)  Anesthesia Type: General     Last Vitals:  Vitals:   02/11/17 1052  BP: 135/72  Pulse: 62  Resp: 18  Temp: 36.6 C    Last Pain:  Vitals:   02/11/17 1052  TempSrc: Tympanic                 COOK-MARTIN,CINDY

## 2017-02-11 NOTE — Anesthesia Postprocedure Evaluation (Addendum)
Anesthesia Post Note  Patient: [name] [name].  Procedure(s) Performed: Procedure(s) (LRB): COLONOSCOPY WITH PROPOFOL (N/A)  Patient location during evaluation: Endoscopy Anesthesia Type: General Level of consciousness: awake and alert Pain management: pain level controlled Vital Signs Assessment: post-procedure vital signs reviewed and stable Respiratory status: spontaneous breathing, nonlabored ventilation, respiratory function stable and patient connected to nasal cannula oxygen Cardiovascular status: blood pressure returned to baseline and stable Postop Assessment: no signs of nausea or vomiting Anesthetic complications: no     Last Vitals:  Vitals:   02/11/17 1052 02/11/17 1154  BP: 135/72 (!) 99/54  Pulse: 62   Resp: 18   Temp: 36.6 C (!) 36.1 C    Last Pain:  Vitals:   02/11/17 1154  TempSrc: Tympanic                 Precious Haws Piscitello

## 2017-02-11 NOTE — Anesthesia Procedure Notes (Signed)
Performed by: COOK-MARTIN, Osie Merkin Pre-anesthesia Checklist: Patient identified, Emergency Drugs available, Suction available, Patient being monitored and Timeout performed Patient Re-evaluated:Patient Re-evaluated prior to inductionOxygen Delivery Method: Nasal cannula Preoxygenation: Pre-oxygenation with 100% oxygen Intubation Type: IV induction Placement Confirmation: CO2 detector and positive ETCO2       

## 2017-02-11 NOTE — Op Note (Signed)
[facility] Gastroenterology Patient Name: [name] [name] Procedure Date: 02/11/2017 11:16 AM MRN: [id number] Account #: [id number] Date of Birth: 1946-09-16 Admit Type: Outpatient Age: 71 Room: [facility] ENDO ROOM 1 Gender: Male Note Status: Finalized Procedure:            Colonoscopy Indications:          Screening for colorectal malignant neoplasm Providers:            Manya Silvas, MD Referring MD:         Janine Ores. Rosanna Randy, MD (Referring MD) Medicines:            Propofol per Anesthesia Complications:        No immediate complications. Procedure:            Pre-Anesthesia Assessment:                       - After reviewing the risks and benefits, the patient                        was deemed in satisfactory condition to undergo the                        procedure.                       After obtaining informed consent, the colonoscope was                        passed under direct vision. Throughout the procedure,                        the patient's blood pressure, pulse, and oxygen                        saturations were monitored continuously. The                        Colonoscope was introduced through the anus and                        advanced to the the cecum, identified by appendiceal                        orifice and ileocecal valve. The colonoscopy was                        performed without difficulty. The patient tolerated the                        procedure well. The quality of the bowel preparation                        was excellent. Findings:      A small polyp was found in the proximal transverse colon. The polyp was       sessile. The polyp was removed with a hot snare. Resection and retrieval       were complete.      A diminutive polyp was found in the hepatic flexure. The polyp was       sessile. The polyp was removed with a jumbo cold forceps. Resection and  retrieval were complete.      A few small and medium-mouthed  diverticula were found in the sigmoid       colon.      Exam of the prostate showed very small nodularity on the left side. Impression:           - One small polyp in the proximal transverse colon,                        removed with a hot snare. Resected and retrieved.                       - One diminutive polyp at the hepatic flexure, removed                        with a jumbo cold forceps. Resected and retrieved.                       - Diverticulosis in the sigmoid colon. Recommendation:       - Await pathology results. SEE A UROLOGIST FOR PROSTATE                        VERY SMALL NODULES> Manya Silvas, MD 02/11/2017 12:13:03 PM This report has been signed electronically. Number of Addenda: 0 Note Initiated On: 02/11/2017 11:16 AM Scope Withdrawal Time: 0 hours 0 minutes 2 seconds  Total Procedure Duration: 0 hours 9 minutes 29 seconds       [facility]

## 2017-02-11 NOTE — Transfer of Care (Signed)
Immediate Anesthesia Transfer of Care Note  Patient: [name] [name].  Procedure(s) Performed: Procedure(s): COLONOSCOPY WITH PROPOFOL (N/A)  Patient Location: PACU  Anesthesia Type:General  Level of Consciousness: awake and sedated  Airway & Oxygen Therapy: Patient Spontanous Breathing and Patient connected to nasal cannula oxygen  Post-op Assessment: Report given to RN and Post -op Vital signs reviewed and stable  Post vital signs: Reviewed and stable  Last Vitals:  Vitals:   02/11/17 1052  BP: 135/72  Pulse: 62  Resp: 18  Temp: 36.6 C    Last Pain:  Vitals:   02/11/17 1052  TempSrc: Tympanic         Complications: No apparent anesthesia complications

## 2017-02-11 NOTE — Anesthesia Preprocedure Evaluation (Signed)
Anesthesia Evaluation  Patient identified by MRN, date of birth, ID band Patient awake    Reviewed: Allergy & Precautions, H&P , NPO status , Patient's Chart, lab work & pertinent test results  History of Anesthesia Complications Negative for: history of anesthetic complications  Airway Mallampati: III  TM Distance: >3 FB Neck ROM: full    Dental  (+) Poor Dentition, Chipped   Pulmonary neg pulmonary ROS, neg shortness of breath,    Pulmonary exam normal breath sounds clear to auscultation       Cardiovascular Exercise Tolerance: Good hypertension, (-) angina+ CAD, + Past MI and + Peripheral Vascular Disease  (-) Cardiac Stents and (-) DOE Normal cardiovascular exam Rhythm:regular Rate:Normal     Neuro/Psych  Neuromuscular disease CVA negative psych ROS   GI/Hepatic negative GI ROS, Neg liver ROS, neg GERD  ,  Endo/Other  negative endocrine ROS  Renal/GU negative Renal ROS  negative genitourinary   Musculoskeletal   Abdominal   Peds  Hematology negative hematology ROS (+)   Anesthesia Other Findings Bruising around mouth  Past Medical History: May 1994: Acute myocardial infarction of other inferior * No date: Cancer ([place])     Comment: skin No date: Coronary atherosclerosis of native coronary ar* No date: History of shingles     Comment: waist area/right back side No date: Occlusion and stenosis of carotid artery witho* No date: Other and unspecified hyperlipidemia     Comment: Severe No date: Parkinson's disease No date: Peripheral vascular disease, unspecified August 2015: Shingles 2006: Unspecified cerebral artery occlusion with cer*     Comment: CVA No date: Unspecified essential hypertension  Past Surgical History: 02/17/15: HERNIA REPAIR     Comment: right inguinal hernia , laparoscopic placement              of Bard 3-D max mesh 3/316: HERNIA REPAIR     Comment: ventral hernia, umbilical, open  repair No date: SKIN BIOPSY     Comment: skin cancer  BMI    Body Mass Index:  25.85 kg/m      Reproductive/Obstetrics negative OB ROS                             Anesthesia Physical Anesthesia Plan  ASA: III  Anesthesia Plan: General   Post-op Pain Management:    Induction:   Airway Management Planned:   Additional Equipment:   Intra-op Plan:   Post-operative Plan:   Informed Consent: I have reviewed the patients History and Physical, chart, labs and discussed the procedure including the risks, benefits and alternatives for the proposed anesthesia with the patient or authorized representative who has indicated his/her understanding and acceptance.   Dental Advisory Given  Plan Discussed with: Anesthesiologist, CRNA and Surgeon  Anesthesia Plan Comments:         Anesthesia Quick Evaluation

## 2017-02-11 NOTE — H&P (Signed)
Primary Care Physician:  Wilhemena Durie, MD Primary Gastroenterologist:  Dr. Vira Agar  Pre-Procedure History & Physical: HPI:  [name] Lawhead. is a 71 y.o. male is here for an colonoscopy.   Past Medical History:  Diagnosis Date  . Acute myocardial infarction of other inferior wall, episode of care unspecified May 1994  . Cancer ([place])    skin  . Coronary atherosclerosis of native coronary artery   . History of shingles    waist area/right back side  . Occlusion and stenosis of carotid artery without mention of cerebral infarction   . Other and unspecified hyperlipidemia    Severe  . Parkinson's disease   . Peripheral vascular disease, unspecified   . Shingles August 2015  . Unspecified cerebral artery occlusion with cerebral infarction 2006   CVA  . Unspecified essential hypertension     Past Surgical History:  Procedure Laterality Date  . HERNIA REPAIR  02/17/15   right inguinal hernia , laparoscopic placement of Bard 3-D max mesh  . HERNIA REPAIR  3/316   ventral hernia, umbilical, open repair  . SKIN BIOPSY     skin cancer    Prior to Admission medications   Medication Sig Start Date End Date Taking? Authorizing Provider  amLODipine (NORVASC) 10 MG tablet Take 1 tablet (10 mg total) by mouth daily. 03/29/16  Yes Minna Merritts, MD  aspirin 162 MG EC tablet Take 162 mg by mouth daily.   Yes Historical Provider, MD  atorvastatin (LIPITOR) 80 MG tablet Take 1 tablet (80 mg total) by mouth daily at 6 PM. 03/29/16  Yes Minna Merritts, MD  benazepril (LOTENSIN) 40 MG tablet Take 1 tablet (40 mg total) by mouth daily. 03/29/16  Yes Minna Merritts, MD  carbidopa-levodopa (SINEMET IR) 25-100 MG per tablet Take 2 tablets by mouth 3 (three) times daily.  04/02/14  Yes Historical Provider, MD  Omega-3 Fatty Acids (OMEGA 3 PO) Take 1,400 mg by mouth daily.   Yes Historical Provider, MD    Allergies as of 01/11/2017 - Review Complete 11/14/2016  Allergen Reaction Noted  .  Penicillins  06/14/2009    Family History  Problem Relation Age of Onset  . Hypertension Mother   . Heart disease Father   . Hypertension Sister   . Hypertension Brother   . Heart disease Other   . Prostate cancer Neg Hx   . Kidney cancer Neg Hx     Social History   Social History  . Marital status: Married    Spouse name: N/A  . Number of children: N/A  . Years of education: N/A   Occupational History  . [employer]    Full time   Social History Main Topics  . Smoking status: Never Smoker  . Smokeless tobacco: Never Used  . Alcohol use Yes     Comment: 3 glasses of wine/day  . Drug use: No  . Sexual activity: Not on file   Other Topics Concern  . Not on file   Social History Narrative   Married   Gets regular exercise    Review of Systems: See HPI, otherwise negative ROS  Physical Exam: BP 135/72   Pulse 62   Temp 97.9 F (36.6 C) (Tympanic)   Resp 18   Ht 5\' 8"  (1.727 m)   Wt 77.1 kg (170 lb)   SpO2 96%   BMI 25.85 kg/m  General:   Alert,  pleasant and cooperative in  NAD Head:  Normocephalic and atraumatic. Neck:  Supple; no masses or thyromegaly. Lungs:  Clear throughout to auscultation.    Heart:  Regular rate and rhythm. Abdomen:  Soft, nontender and nondistended. Normal bowel sounds, without guarding, and without rebound.   Neurologic:  Alert and  oriented x4;  grossly normal neurologically.  Impression/Plan: [name] [name]. is here for an colonoscopy to be performed for screening colonoscopy  Risks, benefits, limitations, and alternatives regarding  colonoscopy have been reviewed with the patient.  Questions have been answered.  All parties agreeable.   Gaylyn Cheers, MD  02/11/2017, 11:16 AM

## 2017-02-12 ENCOUNTER — Encounter: Payer: Self-pay | Admitting: Unknown Physician Specialty

## 2017-02-12 LAB — SURGICAL PATHOLOGY

## 2017-02-26 ENCOUNTER — Encounter: Payer: Self-pay | Admitting: Family Medicine

## 2017-03-27 ENCOUNTER — Encounter: Payer: Self-pay | Admitting: Family Medicine

## 2017-03-27 ENCOUNTER — Ambulatory Visit (INDEPENDENT_AMBULATORY_CARE_PROVIDER_SITE_OTHER): Payer: Medicare Other | Admitting: Family Medicine

## 2017-03-27 VITALS — BP 110/64 | HR 72 | Temp 97.8°F | Resp 16 | Wt 177.0 lb

## 2017-03-27 DIAGNOSIS — G20 Parkinson's disease: Secondary | ICD-10-CM | POA: Diagnosis not present

## 2017-03-27 DIAGNOSIS — I10 Essential (primary) hypertension: Secondary | ICD-10-CM

## 2017-03-27 DIAGNOSIS — S39012A Strain of muscle, fascia and tendon of lower back, initial encounter: Secondary | ICD-10-CM | POA: Diagnosis not present

## 2017-03-27 DIAGNOSIS — E7800 Pure hypercholesterolemia, unspecified: Secondary | ICD-10-CM | POA: Diagnosis not present

## 2017-03-27 DIAGNOSIS — G3184 Mild cognitive impairment, so stated: Secondary | ICD-10-CM

## 2017-03-27 DIAGNOSIS — G20A1 Parkinson's disease without dyskinesia, without mention of fluctuations: Secondary | ICD-10-CM

## 2017-03-27 NOTE — Patient Instructions (Addendum)
Use heating pad and Tylenol for back strain. Rest from pull up motions for the rest of the month. Call if not continuing to improve. Add fish oil daily for memory.

## 2017-03-27 NOTE — Progress Notes (Signed)
Subjective:  HPI  Hypertension, follow-up:  BP Readings from Last 3 Encounters:  03/27/17 110/64  02/11/17 (!) 99/54  11/14/16 (!) 151/91    He was last seen for hypertension 6 months ago.  BP at that visit was 151/91. Management since that visit includes none. He reports good compliance with treatment. He is not having side effects.  He is exercising about 6 days a week. He is not adherent to low salt diet.   Outside blood pressures are not being checked regularly. He is experiencing none.  Patient denies chest pain, chest pressure/discomfort, claudication, dyspnea, exertional chest pressure/discomfort, fatigue, irregular heart beat, lower extremity edema, near-syncope, orthopnea, palpitations, paroxysmal nocturnal dyspnea, syncope and tachypnea.   Cardiovascular risk factors include advanced age (older than 60 for men, 56 for women), dyslipidemia, hypertension and male gender.   Wt Readings from Last 3 Encounters:  03/27/17 177 lb (80.3 kg)  02/11/17 170 lb (77.1 kg)  11/14/16 173 lb (78.5 kg)   ------------------------------------------------------------------------ Pt also complaining of back pain. He reports that it started about a week ago. Located in his mid back on the right side. He reports that sometimes it feels tight around the front to his upper abdomen on both sides. He does remember any injury to this area. He does work out 6 days a week so possible that he pulled something. He says sometimes when he moves certain ways it hurts worse than others. Not a constant pain and it was worse last week than it is today.    Prior to Admission medications   Medication Sig Start Date End Date Taking? Authorizing Provider  amLODipine (NORVASC) 10 MG tablet Take 1 tablet (10 mg total) by mouth daily. 03/29/16   Minna Merritts, MD  aspirin 162 MG EC tablet Take 162 mg by mouth daily.    Historical Provider, MD  atorvastatin (LIPITOR) 80 MG tablet Take 1 tablet (80 mg total)  by mouth daily at 6 PM. 03/29/16   Minna Merritts, MD  benazepril (LOTENSIN) 40 MG tablet Take 1 tablet (40 mg total) by mouth daily. 03/29/16   Minna Merritts, MD  carbidopa-levodopa (SINEMET IR) 25-100 MG per tablet Take 2 tablets by mouth 3 (three) times daily.  04/02/14   Historical Provider, MD  Omega-3 Fatty Acids (OMEGA 3 PO) Take 1,400 mg by mouth daily.    Historical Provider, MD    Patient Active Problem List   Diagnosis Date Noted  . Herpes zona 04/19/2016  . HZV (herpes zoster virus) post herpetic neuralgia 04/19/2016  . Umbilical hernia without obstruction and without gangrene 10/15/2014  . Hernia of abdominal cavity 10/15/2014  . PVD 10/02/2010  . WEIGHT GAIN 02/21/2010  . Hyperlipidemia 02/16/2010  . Essential hypertension 02/16/2010  . Old myocardial infarction 02/16/2010  . CAD, NATIVE VESSEL 02/16/2010  . Carotid stenosis 06/14/2009  . Carotid artery obstruction 06/14/2009  . PARKINSON'S DISEASE 05/12/2009    Past Medical History:  Diagnosis Date  . Acute myocardial infarction of other inferior wall, episode of care unspecified May 1994  . Cancer ([place])    skin  . Coronary atherosclerosis of native coronary artery   . History of shingles    waist area/right back side  . Occlusion and stenosis of carotid artery without mention of cerebral infarction   . Other and unspecified hyperlipidemia    Severe  . Parkinson's disease   . Peripheral vascular disease, unspecified   . Shingles August 2015  . Unspecified cerebral artery  occlusion with cerebral infarction 2006   CVA  . Unspecified essential hypertension     Social History   Social History  . Marital status: Married    Spouse name: N/A  . Number of children: N/A  . Years of education: N/A   Occupational History  . [employer]    Full time   Social History Main Topics  . Smoking status: Never Smoker  . Smokeless tobacco: Never Used  . Alcohol use Yes     Comment: 2-3  glasses of wine/day  . Drug use: No  . Sexual activity: Not on file   Other Topics Concern  . Not on file   Social History Narrative   Married   Gets regular exercise    Allergies  Allergen Reactions  . Penicillins     Review of Systems  Constitutional: Negative.   HENT: Negative.   Eyes: Negative.   Respiratory: Negative.   Cardiovascular: Negative.   Gastrointestinal: Negative.   Genitourinary: Negative.   Musculoskeletal: Positive for back pain.  Skin: Negative.   Neurological: Negative.   Endo/Heme/Allergies: Negative.   Psychiatric/Behavioral: Negative.     Immunization History  Administered Date(s) Administered  . Pneumococcal Conjugate-13 04/24/2016  . Tdap 04/24/2016    Objective:  BP 110/64 (BP Location: Left Arm, Patient Position: Sitting, Cuff Size: Normal)   Pulse 72   Temp 97.8 F (36.6 C) (Oral)   Resp 16   Wt 177 lb (80.3 kg)   BMI 26.91 kg/m   Physical Exam  Constitutional: He is oriented to person, place, and time and well-developed, well-nourished, and in no distress.  Eyes: Conjunctivae and EOM are normal. Pupils are equal, round, and reactive to light.  Neck: Normal range of motion. Neck supple.  Cardiovascular: Normal rate, regular rhythm, normal heart sounds and intact distal pulses.   Pulmonary/Chest: Effort normal and breath sounds normal.  Abdominal: Soft. Bowel sounds are normal.  Negative Murphy's sign.   Musculoskeletal: Normal range of motion.  No CVA tenderness  Neurological: He is alert and oriented to person, place, and time. He has normal reflexes. Gait normal. GCS score is 15.  Skin: Skin is warm and dry.  Psychiatric: Mood, memory, affect and judgment normal.    Lab Results  Component Value Date   WBC 6.7 04/24/2016   HGB 14.3 02/10/2015   HCT 43.2 04/24/2016   PLT 238 04/24/2016   GLUCOSE 98 04/24/2016   CHOL 171 04/24/2016   TRIG 62 04/24/2016   HDL 63 04/24/2016   LDLCALC 96 04/24/2016   TSH 0.789  04/24/2016   HGBA1C 5.5 04/24/2016    CMP     Component Value Date/Time   NA 138 04/24/2016 1000   K 5.0 04/24/2016 1000   CL 98 04/24/2016 1000   CO2 25 04/24/2016 1000   GLUCOSE 98 04/24/2016 1000   GLUCOSE 100 (H) 04/01/2012 0940   BUN 9 04/24/2016 1000   CREATININE 0.56 (L) 04/24/2016 1000   CALCIUM 9.2 04/24/2016 1000   PROT 7.2 04/24/2016 1000   ALBUMIN 4.4 04/24/2016 1000   AST 20 04/24/2016 1000   ALT 6 04/24/2016 1000   ALKPHOS 50 04/24/2016 1000   BILITOT 1.2 04/24/2016 1000   GFRNONAA 106 04/24/2016 1000   GFRAA 122 04/24/2016 1000    Assessment and Plan :  1. Essential hypertension Stable. Follow up in 6 months. - CBC with Differential/Platelet - TSH  2. Pure hypercholesterolemia  - Lipid Panel With LDL/HDL Ratio -  Comprehensive metabolic panel  3. PARKINSON'S DISEASE   4. Back strain, initial encounter Most likely muscular. Instructed to use heating pad and rest. If not continuing to improve pt will call back.   5. MCI (mild cognitive impairment) Start fish oil daily. MMSE next OV.    HPI, Exam, and A&P Transcribed under the direction and in the presence of Jashae Wiggs L. Cranford Mon, MD  Electronically Signed: Katina Dung, [place] MD [facility] 03/27/2017 8:35 AM

## 2017-04-01 ENCOUNTER — Other Ambulatory Visit: Payer: Medicare Other

## 2017-04-01 DIAGNOSIS — C61 Malignant neoplasm of prostate: Secondary | ICD-10-CM | POA: Diagnosis not present

## 2017-04-02 LAB — PSA: Prostate Specific Ag, Serum: 4.2 ng/mL — ABNORMAL HIGH (ref 0.0–4.0)

## 2017-04-03 ENCOUNTER — Ambulatory Visit (INDEPENDENT_AMBULATORY_CARE_PROVIDER_SITE_OTHER): Payer: Medicare Other | Admitting: Urology

## 2017-04-03 ENCOUNTER — Encounter: Payer: Self-pay | Admitting: Urology

## 2017-04-03 VITALS — BP 109/59 | HR 60 | Ht 68.0 in | Wt 174.7 lb

## 2017-04-03 DIAGNOSIS — C61 Malignant neoplasm of prostate: Secondary | ICD-10-CM

## 2017-04-03 NOTE — Progress Notes (Signed)
04/03/2017 9:33 AM   [name] [name]. [birth year] [id number]  Referring provider: Jerrol Banana., MD [address] [place], [place] [address]  Chief Complaint  Patient presents with  . Prostate Cancer    HPI: 71 yo M With a history of low risk prostate cancer managed by active surveillance returns today for PSA/DRE. His most recent PSA was 4.2, down from 4.6 at time of dx.    [name] [name] referred for elevated PSA , prostate nodule on recent prostate exam by Dr. Rosanna Randy.  His PSA was found to be 4.6 on 09/2016.  He did indeed have a left-sided firm nodule in the mid gland.  Prostate biopsy on 10/31/16 shows Gleason 3+3 in 6/12 cores, 4 on left and 2 on right up to 58%.  No family history of prostate cancer.  No significant bone pain.  He has been actively trying to lose weight.    He does complain today about urinary urgency and occasional urge incontinence.  He does have frequency when he drinks colas.   Nocturia x 1-2.  His stream is OK and he does feel that he is able to empty his bladder.   PMHx significant for Parkinson's diease (dx 14 years ago without much progression) and MI in '94 on ASA treated medically (followed Dr. Rockey Situ).         IPSS    Row Name 04/03/17 0900         International Prostate Symptom Score   How often have you had the sensation of not emptying your bladder? Less than half the time     How often have you had to urinate less than every two hours? Less than half the time     How often have you found you stopped and started again several times when you urinated? Less than half the time     How often have you found it difficult to postpone urination? About half the time     How often have you had a weak urinary stream? Less than half the time     How often have you had to strain to start urination? Not at All     How many times did you typically get up at night to urinate? 2 Times     Total IPSS Score 13       Quality of Life  due to urinary symptoms   If you were to spend the rest of your life with your urinary condition just the way it is now how would you feel about that? Mostly Satisfied       Score:  1-7 Mild 8-19 Moderate 20-35 Severe       PMH: Past Medical History:  Diagnosis Date  . Acute myocardial infarction of other inferior wall, episode of care unspecified May 1994  . Cancer ([place])    skin  . Coronary atherosclerosis of native coronary artery   . History of shingles    waist area/right back side  . Occlusion and stenosis of carotid artery without mention of cerebral infarction   . Other and unspecified hyperlipidemia    Severe  . Parkinson's disease   . Peripheral vascular disease, unspecified ([place])   . Shingles August 2015  . Unspecified cerebral artery occlusion with cerebral infarction 2006   CVA  . Unspecified essential hypertension     Surgical History: Past Surgical History:  Procedure Laterality Date  . COLONOSCOPY WITH PROPOFOL N/A 02/11/2017   Procedure: COLONOSCOPY WITH  PROPOFOL;  Surgeon: Manya Silvas, MD;  Location: [facility] ENDOSCOPY;  Service: Endoscopy;  Laterality: N/A;  . HERNIA REPAIR  02/17/15   right inguinal hernia , laparoscopic placement of Bard 3-D max mesh  . HERNIA REPAIR  3/316   ventral hernia, umbilical, open repair  . SKIN BIOPSY     skin cancer    Home Medications:  Allergies as of 04/03/2017      Reactions   Penicillins       Medication List       Accurate as of 04/03/17  9:33 AM. Always use your most recent med list.          amLODipine 10 MG tablet Commonly known as:  NORVASC Take 1 tablet (10 mg total) by mouth daily.   aspirin 162 MG EC tablet Take 162 mg by mouth daily.   atorvastatin 80 MG tablet Commonly known as:  LIPITOR Take 1 tablet (80 mg total) by mouth daily at 6 PM.   benazepril 40 MG tablet Commonly known as:  LOTENSIN Take 1 tablet (40 mg total) by mouth daily.   carbidopa-levodopa 25-250 MG tablet Commonly  known as:  SINEMET IR 3 (three) times daily.   OMEGA 3 PO Take 1,400 mg by mouth daily.       Allergies:  Allergies  Allergen Reactions  . Penicillins     Family History: Family History  Problem Relation Age of Onset  . Hypertension Mother   . Heart disease Father   . Hypertension Sister   . Hypertension Brother   . Heart disease Other   . Prostate cancer Neg Hx   . Kidney cancer Neg Hx     Social History:  reports that he has never smoked. He has never used smokeless tobacco. He reports that he drinks alcohol. He reports that he does not use drugs.  ROS: UROLOGY Frequent Urination?: Yes Hard to postpone urination?: Yes Burning/pain with urination?: No Get up at night to urinate?: Yes Leakage of urine?: No Urine stream starts and stops?: No Trouble starting stream?: No Do you have to strain to urinate?: No Blood in urine?: No Urinary tract infection?: No Sexually transmitted disease?: No Injury to kidneys or bladder?: No Painful intercourse?: No Weak stream?: No Erection problems?: No Penile pain?: No  Gastrointestinal Nausea?: No Vomiting?: No Indigestion/heartburn?: No Diarrhea?: No Constipation?: No  Constitutional Fever: No Night sweats?: No Weight loss?: No Fatigue?: No  Skin Skin rash/lesions?: No Itching?: No  Eyes Blurred vision?: No Double vision?: No  Ears/Nose/Throat Sore throat?: No Sinus problems?: No  Hematologic/Lymphatic Swollen glands?: No Easy bruising?: No  Cardiovascular Leg swelling?: No Chest pain?: No  Respiratory Cough?: No Shortness of breath?: No  Endocrine Excessive thirst?: No  Musculoskeletal Back pain?: Yes Joint pain?: No  Neurological Headaches?: No Dizziness?: No  Psychologic Depression?: No Anxiety?: No  Physical Exam: BP (!) 109/59 (BP Location: Left Arm, Patient Position: Sitting, Cuff Size: Normal)   Pulse 60   Ht 5\' 8"  (1.727 m)   Wt 174 lb 11.2 oz (79.2 kg)   BMI 26.56 kg/m    Constitutional:  Alert and oriented, No acute distress. HEENT: [place] AT, moist mucus membranes.  Trachea midline, no masses. Cardiovascular: No clubbing, cyanosis, or edema. Respiratory: Normal respiratory effort, no increased work of breathing. GI: Abdomen is soft, nontender, nondistended, no abdominal masses GU: No CVA tenderness.  Rectal: Normal sphincter tone. Moderate-sized prostate, small left nodule, unchanged.  Nontender. Skin: No rashes, bruises or suspicious lesions.  Neurologic: Grossly intact, no focal deficits, moving all 4 extremities. Psychiatric: Normal mood and affect.  Laboratory Data: Lab Results  Component Value Date   WBC 6.7 04/24/2016   HGB 14.3 02/10/2015   HCT 43.2 04/24/2016   MCV 92 04/24/2016   PLT 238 04/24/2016    Lab Results  Component Value Date   CREATININE 0.56 (L) 04/24/2016      Lab Results  Component Value Date   HGBA1C 5.5 04/24/2016   Component     Latest Ref Rng & Units 09/18/2016 04/01/2017  PSA     0.0 - 4.0 ng/mL 4.6 (H) 4.2 (H)    Urinalysis n/a  Assessment & Plan:    1. Prostate cancer ([place]) cT2a Gleason 3+3 prostate cancer, low risk per NCCN guidelines, dx 10/2016 PSA stable, rectal exam unchanged Recommend continued active surveillance, follow-up in 4 months with PSA/DRE We did discuss repeat prostate biopsy around 10/2017 is confirmatory biopsy   Return in about 4 months (around 08/03/2017) for PSA/ DRE.  Hollice Espy, MD  [facility] [address] [place], [place] [address] [phone]

## 2017-04-04 ENCOUNTER — Other Ambulatory Visit: Payer: Self-pay | Admitting: Cardiovascular Disease

## 2017-04-13 NOTE — Progress Notes (Signed)
Cardiology Office Note  Date:  04/15/2017   ID:  [name] Liming., DOB [birth year], MRN [id number]  PCP:  [name] Durie, MD   Chief Complaint  Patient presents with  . other    72mo f/u. Pt states he is doing well. New Dc of prostate cancer (low risk). Reviewed meds with pt verbally.    HPI:  [name] [name] is a pleasant 71 year old gentleman with a history of  bilateral moderate carotid disease, 50 to 59% on the right 10/2015 prior MI in 1994 with disease in the distal RCA with medical management  stress test in January 2012 with fixed inferior wall defect,   history of Parkinson's Presents for routine followup of his coronary artery disease and PAD  55 min on treadmill Daily, weights Denies any significant chest pain concerning for angina sees Dr. Shah,neurology Feels his memory is not as good  Balance is good, feels like Parkinson's is stable,  Tremor from his Parkinson's ,  stable   New dx of prostate CA,low risk PSA elevated 4.6  Down to 4.2  Reports his blood pressure is well controlled.  Overall no complaints. Denies any chest pain or shortness of breath with exertion  EKG on today's visit shows normal sinus rhythm with rate 62 bpm, T wave abn III and AVF  Other past medical history  LDL close to 100, total cholesterol in the 190 range No recent lab work available   PMH:   has a past medical history of Acute myocardial infarction of other inferior wall, episode of care unspecified (May 1994); Cancer ([place]); Coronary atherosclerosis of native coronary artery; History of shingles; Occlusion and stenosis of carotid artery without mention of cerebral infarction; Other and unspecified hyperlipidemia; Parkinson's disease; Peripheral vascular disease, unspecified ([place]); Prostate cancer ([place]) (10/2016); Shingles (August 2015); Unspecified cerebral artery occlusion with cerebral infarction (2006); and Unspecified essential hypertension.  PSH:    Past Surgical History:   Procedure Laterality Date  . COLONOSCOPY WITH PROPOFOL N/A 02/11/2017   Procedure: COLONOSCOPY WITH PROPOFOL;  Surgeon: Manya Silvas, MD;  Location: [facility] ENDOSCOPY;  Service: Endoscopy;  Laterality: N/A;  . HERNIA REPAIR  02/17/15   right inguinal hernia , laparoscopic placement of Bard 3-D max mesh  . HERNIA REPAIR  3/316   ventral hernia, umbilical, open repair  . SKIN BIOPSY     skin cancer    Current Outpatient Prescriptions  Medication Sig Dispense Refill  . amLODipine (NORVASC) 10 MG tablet Take 1 tablet (10 mg total) by mouth daily. 90 tablet 3  . aspirin 162 MG EC tablet Take 162 mg by mouth daily.    Marland Kitchen atorvastatin (LIPITOR) 80 MG tablet TAKE 1 TABLET BY MOUTH DAILY AT 6PM 30 tablet 0  . benazepril (LOTENSIN) 40 MG tablet Take 1 tablet (40 mg total) by mouth daily. 90 tablet 3  . carbidopa-levodopa (SINEMET IR) 25-250 MG tablet 3 (three) times daily.     . Omega-3 Fatty Acids (OMEGA 3 PO) Take 1,400 mg by mouth daily.     No current facility-administered medications for this visit.      Allergies:   Penicillins   Social History:  The patient  reports that he has never smoked. He has never used smokeless tobacco. He reports that he drinks alcohol. He reports that he does not use drugs.   Family History:   family history includes Heart disease in his father and other; Hypertension in his brother, mother, and sister.    Review of  Systems: Review of Systems  Constitutional: Negative.   Respiratory: Negative.   Cardiovascular: Negative.   Gastrointestinal: Negative.   Musculoskeletal: Negative.   Neurological: Positive for tremors.  Psychiatric/Behavioral: Positive for memory loss.  All other systems reviewed and are negative.    PHYSICAL EXAM: VS:  BP (!) 106/58 (BP Location: Left Arm, Patient Position: Sitting, Cuff Size: Normal)   Pulse 62   Ht 5\' 8"  (1.727 m)   Wt 176 lb (79.8 kg)   BMI 26.76 kg/m  , BMI Body mass index is 26.76 kg/m. GEN: Well  nourished, well developed, in no acute distress  HEENT: normal  Neck: no JVD, carotid bruits, or masses Cardiac: RRR; no murmurs, rubs, or gallops,no edema  Respiratory:  clear to auscultation bilaterally, normal work of breathing GI: soft, nontender, nondistended, + BS MS: no deformity or atrophy  Skin: warm and dry, no rash Neuro:  Strength and sensation are intact Psych: euthymic mood, full affect    Recent Labs: 04/24/2016: ALT 6; BUN 9; Creatinine, Ser 0.56; Platelets 238; Potassium 5.0; Sodium 138; TSH 0.789    Lipid Panel Lab Results  Component Value Date   CHOL 171 04/24/2016   HDL 63 04/24/2016   LDLCALC 96 04/24/2016   TRIG 62 04/24/2016      Wt Readings from Last 3 Encounters:  04/15/17 176 lb (79.8 kg)  04/03/17 174 lb 11.2 oz (79.2 kg)  03/27/17 177 lb (80.3 kg)       ASSESSMENT AND PLAN:  Atherosclerosis of native coronary artery of native heart with stable angina pectoris (HCC) Currently with no symptoms of angina. No further workup at this time. Continue current medication regimen.  Essential hypertension Blood pressure is well controlled on today's visit. No changes made to the medications.  Mixed hyperlipidemia  No changes to the medications were made. Scheduled to have lab work you primary care  Bilateral carotid artery stenosis We will schedule repeat carotid ultrasound November 2018 50-60% stenosis on the right  PARKINSON'S DISEASE Long discussion concerning his neurologic issues including memory loss, tremor, balance . Recommended continued exercise as well as starting puzzles, reading program   he has scheduled follow-up with neurology  Disposition:   F/U  12 months   Total encounter time more than 25 minutes  Greater than 50% was spent in counseling and coordination of care with the patient   No orders of the defined types were placed in this encounter.    Signed, Esmond Plants, M.D., Ph.D. 04/15/2017  [place], [place]

## 2017-04-15 ENCOUNTER — Ambulatory Visit (INDEPENDENT_AMBULATORY_CARE_PROVIDER_SITE_OTHER): Payer: Medicare Other | Admitting: Cardiovascular Disease

## 2017-04-15 ENCOUNTER — Encounter: Payer: Self-pay | Admitting: Cardiovascular Disease

## 2017-04-15 VITALS — BP 106/58 | HR 62 | Ht 68.0 in | Wt 176.0 lb

## 2017-04-15 DIAGNOSIS — I10 Essential (primary) hypertension: Secondary | ICD-10-CM | POA: Diagnosis not present

## 2017-04-15 DIAGNOSIS — E782 Mixed hyperlipidemia: Secondary | ICD-10-CM | POA: Diagnosis not present

## 2017-04-15 DIAGNOSIS — I25118 Atherosclerotic heart disease of native coronary artery with other forms of angina pectoris: Secondary | ICD-10-CM

## 2017-04-15 DIAGNOSIS — I209 Angina pectoris, unspecified: Secondary | ICD-10-CM

## 2017-04-15 DIAGNOSIS — G20 Parkinson's disease: Secondary | ICD-10-CM

## 2017-04-15 DIAGNOSIS — I6523 Occlusion and stenosis of bilateral carotid arteries: Secondary | ICD-10-CM

## 2017-04-15 NOTE — Patient Instructions (Signed)
Medication Instructions:   No medication changes made  Labwork:  No new labs needed  Testing/Procedures:  Recheck of carotid artery u/s in 10/2017, known carotid stenosis   I recommend watching educational videos on topics of interest to you at:       www.goemmi.com  Enter code: HEARTCARE    Follow-Up: It was a pleasure seeing you in the office today. Please call us if you have new issues that need to be addressed before your next appt.  [phone]  Your physician wants you to follow-up in: 12 months.  You will receive a reminder letter in the mail two months in advance. If you don't receive a letter, please call our office to schedule the follow-up appointment.  If you need a refill on your cardiac medications before your next appointment, please call your pharmacy.

## 2017-04-19 DIAGNOSIS — G20 Parkinson's disease: Secondary | ICD-10-CM | POA: Diagnosis not present

## 2017-04-19 DIAGNOSIS — E7800 Pure hypercholesterolemia, unspecified: Secondary | ICD-10-CM | POA: Diagnosis not present

## 2017-04-19 DIAGNOSIS — I10 Essential (primary) hypertension: Secondary | ICD-10-CM | POA: Diagnosis not present

## 2017-04-20 LAB — COMPREHENSIVE METABOLIC PANEL
ALBUMIN: 4.4 g/dL (ref 3.5–4.8)
ALK PHOS: 48 IU/L (ref 39–117)
ALT: 8 IU/L (ref 0–44)
AST: 19 IU/L (ref 0–40)
Albumin/Globulin Ratio: 1.6 (ref 1.2–2.2)
BILIRUBIN TOTAL: 1 mg/dL (ref 0.0–1.2)
BUN / CREAT RATIO: 23 (ref 10–24)
BUN: 13 mg/dL (ref 8–27)
CHLORIDE: 96 mmol/L (ref 96–106)
CO2: 24 mmol/L (ref 18–29)
Calcium: 9.1 mg/dL (ref 8.6–10.2)
Creatinine, Ser: 0.57 mg/dL — ABNORMAL LOW (ref 0.76–1.27)
GFR calc Af Amer: 120 mL/min/{1.73_m2} (ref >59)
GFR calc non Af Amer: 104 mL/min/{1.73_m2} (ref >59)
GLOBULIN, TOTAL: 2.7 g/dL (ref 1.5–4.5)
GLUCOSE: 104 mg/dL — AB (ref 65–99)
Potassium: 4.2 mmol/L (ref 3.5–5.2)
SODIUM: 135 mmol/L (ref 134–144)
Total Protein: 7.1 g/dL (ref 6.0–8.5)

## 2017-04-20 LAB — CBC WITH DIFFERENTIAL/PLATELET
BASOS ABS: 0 10*3/uL (ref 0.0–0.2)
BASOS: 0 %
EOS (ABSOLUTE): 0 10*3/uL (ref 0.0–0.4)
Eos: 1 %
Hematocrit: 43.5 % (ref 37.5–51.0)
Hemoglobin: 14.5 g/dL (ref 13.0–17.7)
Immature Grans (Abs): 0 10*3/uL (ref 0.0–0.1)
Immature Granulocytes: 0 %
LYMPHS ABS: 2 10*3/uL (ref 0.7–3.1)
LYMPHS: 31 %
MCH: 31.7 pg (ref 26.6–33.0)
MCHC: 33.3 g/dL (ref 31.5–35.7)
MCV: 95 fL (ref 79–97)
MONOS ABS: 0.6 10*3/uL (ref 0.1–0.9)
Monocytes: 9 %
NEUTROS ABS: 3.9 10*3/uL (ref 1.4–7.0)
Neutrophils: 59 %
Platelets: 223 10*3/uL (ref 150–379)
RBC: 4.57 x10E6/uL (ref 4.14–5.80)
RDW: 13.2 % (ref 12.3–15.4)
WBC: 6.5 10*3/uL (ref 3.4–10.8)

## 2017-04-20 LAB — LIPID PANEL WITH LDL/HDL RATIO
Cholesterol, Total: 169 mg/dL (ref 100–199)
HDL: 62 mg/dL (ref >39)
LDL Calculated: 95 mg/dL (ref 0–99)
LDL/HDL RATIO: 1.5 ratio (ref 0.0–3.6)
Triglycerides: 59 mg/dL (ref 0–149)
VLDL CHOLESTEROL CAL: 12 mg/dL (ref 5–40)

## 2017-04-20 LAB — TSH: TSH: 0.669 u[IU]/mL (ref 0.450–4.500)

## 2017-04-22 ENCOUNTER — Telehealth: Payer: Self-pay

## 2017-04-22 NOTE — Telephone Encounter (Signed)
Left message to call back  

## 2017-04-22 NOTE — Telephone Encounter (Signed)
Pt is returning call.  CB#[phone]/MW

## 2017-04-22 NOTE — Telephone Encounter (Signed)
-----   Message from Jerrol Banana., MD sent at 04/22/2017  8:18 AM EDT ----- Labs ok

## 2017-04-23 NOTE — Telephone Encounter (Signed)
Advised patient of results.  

## 2017-04-25 ENCOUNTER — Other Ambulatory Visit: Payer: Self-pay | Admitting: Cardiovascular Disease

## 2017-05-03 ENCOUNTER — Other Ambulatory Visit: Payer: Self-pay | Admitting: Cardiovascular Disease

## 2017-06-12 DIAGNOSIS — Z85820 Personal history of malignant melanoma of skin: Secondary | ICD-10-CM | POA: Diagnosis not present

## 2017-06-12 DIAGNOSIS — Z85828 Personal history of other malignant neoplasm of skin: Secondary | ICD-10-CM | POA: Diagnosis not present

## 2017-06-12 DIAGNOSIS — L570 Actinic keratosis: Secondary | ICD-10-CM | POA: Diagnosis not present

## 2017-06-12 DIAGNOSIS — D485 Neoplasm of uncertain behavior of skin: Secondary | ICD-10-CM | POA: Diagnosis not present

## 2017-07-23 ENCOUNTER — Other Ambulatory Visit: Payer: Medicare Other

## 2017-07-23 ENCOUNTER — Other Ambulatory Visit: Payer: Self-pay

## 2017-07-23 DIAGNOSIS — C61 Malignant neoplasm of prostate: Secondary | ICD-10-CM | POA: Diagnosis not present

## 2017-07-24 LAB — PSA: PROSTATE SPECIFIC AG, SERUM: 4.7 ng/mL — AB (ref 0.0–4.0)

## 2017-07-26 ENCOUNTER — Ambulatory Visit: Payer: Medicare Other | Admitting: Urology

## 2017-08-02 ENCOUNTER — Encounter: Payer: Self-pay | Admitting: Urology

## 2017-08-02 ENCOUNTER — Ambulatory Visit (INDEPENDENT_AMBULATORY_CARE_PROVIDER_SITE_OTHER): Payer: Medicare Other | Admitting: Urology

## 2017-08-02 VITALS — BP 112/62 | HR 64 | Ht 68.0 in | Wt 170.0 lb

## 2017-08-02 DIAGNOSIS — I6523 Occlusion and stenosis of bilateral carotid arteries: Secondary | ICD-10-CM | POA: Diagnosis not present

## 2017-08-02 DIAGNOSIS — C61 Malignant neoplasm of prostate: Secondary | ICD-10-CM | POA: Diagnosis not present

## 2017-08-02 MED ORDER — DIAZEPAM 10 MG PO TABS: 10.0000 mg | ORAL_TABLET | Freq: Four times a day (QID) | ORAL | 0 refills | Status: DC | PRN

## 2017-08-02 NOTE — Progress Notes (Signed)
08/02/2017 9:26 AM   [name] [name]. January 06, 1946 [id number]  Referring provider: Jerrol Banana., MD [address] [place], [place] [address]  Chief Complaint  Patient presents with  . Prostate Cancer    4 months    HPI: 71 yo M With a history of low risk prostate cancer managed by active surveillance returns today for PSA/DRE. His most recent PSA was 4.7 on 103/18,   (iPSA 4.6).  Mr. Burdo referred for elevated PSA , prostate nodule on recent prostate exam by Dr. Rosanna Randy.  His PSA was found to be 4.6 on 09/2016.  He did indeed have a left-sided firm nodule in the mid gland.  Prostate biopsy on 10/31/16 shows Gleason 3+3 in 6/12 cores, 4 on left and 2 on right up to 58%.  No family history of prostate cancer.  No significant bone pain.   He does complain today about urinary urgency and occasional urge incontinence.  He does have frequency when he drinks colas.   Nocturia x 1-2, stable.  His stream is OK and he does feel that he is able to empty his bladder.   Unchanged.    PMHx significant for Parkinson's diease (dx 14 years ago without much progression) and MI in '94 on ASA treated medically (followed Dr. Rockey Situ).  Previously cleared to hold aspirin for procedures.    PMH: Past Medical History:  Diagnosis Date  . Acute myocardial infarction of other inferior wall, episode of care unspecified May 1994  . Cancer ([place])    skin  . Coronary atherosclerosis of native coronary artery   . History of shingles    waist area/right back side  . Occlusion and stenosis of carotid artery without mention of cerebral infarction   . Other and unspecified hyperlipidemia    Severe  . Parkinson's disease   . Peripheral vascular disease, unspecified ([place])   . Prostate cancer ([place]) 10/2016   low risk  . Shingles August 2015  . Unspecified cerebral artery occlusion with cerebral infarction 2006   CVA  . Unspecified essential hypertension     Surgical History: Past  Surgical History:  Procedure Laterality Date  . COLONOSCOPY WITH PROPOFOL N/A 02/11/2017   Procedure: COLONOSCOPY WITH PROPOFOL;  Surgeon: Manya Silvas, MD;  Location: [facility] ENDOSCOPY;  Service: Endoscopy;  Laterality: N/A;  . HERNIA REPAIR  02/17/15   right inguinal hernia , laparoscopic placement of Bard 3-D max mesh  . HERNIA REPAIR  3/316   ventral hernia, umbilical, open repair  . SKIN BIOPSY     skin cancer    Home Medications:  Allergies as of 08/02/2017      Reactions   Penicillins       Medication List       Accurate as of 08/02/17  9:26 AM. Always use your most recent med list.          amLODipine 10 MG tablet Commonly known as:  NORVASC TAKE 1 TABLET BY MOUTH DAILY   aspirin 162 MG EC tablet Take 162 mg by mouth daily.   atorvastatin 80 MG tablet Commonly known as:  LIPITOR TAKE 1 TABLET BY MOUTH DAILY AT 6PM   benazepril 40 MG tablet Commonly known as:  LOTENSIN TAKE 1 TABLET BY MOUTH DAILY   carbidopa-levodopa 25-250 MG tablet Commonly known as:  SINEMET IR 3 (three) times daily.   diazepam 10 MG tablet Commonly known as:  VALIUM Take 1 tablet (10 mg total) by mouth every 6 (  six) hours as needed for anxiety.   OMEGA 3 PO Take 1,400 mg by mouth daily.       Allergies:  Allergies  Allergen Reactions  . Penicillins     Family History: Family History  Problem Relation Age of Onset  . Hypertension Mother   . Heart disease Father   . Hypertension Sister   . Hypertension Brother   . Heart disease Other   . Prostate cancer Neg Hx   . Kidney cancer Neg Hx     Social History:  reports that he has never smoked. He has never used smokeless tobacco. He reports that he drinks alcohol. He reports that he does not use drugs.  ROS: UROLOGY Frequent Urination?: No Hard to postpone urination?: Yes Burning/pain with urination?: No Get up at night to urinate?: Yes Leakage of urine?: No Urine stream starts and stops?: No Trouble starting  stream?: No Do you have to strain to urinate?: No Blood in urine?: No Urinary tract infection?: No Sexually transmitted disease?: No Injury to kidneys or bladder?: No Painful intercourse?: No Weak stream?: No Erection problems?: No Penile pain?: No  Gastrointestinal Nausea?: No Vomiting?: No Indigestion/heartburn?: No Diarrhea?: No Constipation?: No  Constitutional Fever: No Night sweats?: No Weight loss?: No Fatigue?: Yes  Skin Skin rash/lesions?: No Itching?: No  Eyes Blurred vision?: No Double vision?: No  Ears/Nose/Throat Sore throat?: No Sinus problems?: No  Hematologic/Lymphatic Swollen glands?: No Easy bruising?: No  Cardiovascular Leg swelling?: No Chest pain?: No  Respiratory Cough?: No Shortness of breath?: No  Endocrine Excessive thirst?: No  Musculoskeletal Back pain?: No Joint pain?: No  Neurological Dizziness?: No  Psychologic Depression?: No Anxiety?: No  Physical Exam: BP 112/62   Pulse 64   Ht 5\' 8"  (1.727 m)   Wt 170 lb (77.1 kg)   BMI 25.85 kg/m   Constitutional:  Alert and oriented, No acute distress. HEENT: [place] AT, moist mucus membranes.  Trachea midline, no masses. Cardiovascular: No clubbing, cyanosis, or edema. Respiratory: Normal respiratory effort, no increased work of breathing. GI: Abdomen is soft, nontender, nondistended, no abdominal masses GU: No CVA tenderness.  Rectal: Deferred, unchanged at last visit from baseline Skin: No rashes, bruises or suspicious lesions. Neurologic: Grossly intact, no focal deficits, moving all 4 extremities. Psychiatric: Normal mood and affect.  Laboratory Data: Lab Results  Component Value Date   WBC 6.5 04/19/2017   HGB 14.5 04/19/2017   HCT 43.5 04/19/2017   MCV 95 04/19/2017   PLT 223 04/19/2017    Lab Results  Component Value Date   CREATININE 0.57 (L) 04/19/2017   Component     Latest Ref Rng & Units 09/18/2016 04/01/2017 07/23/2017  Prostate Specific Ag,  Serum     0.0 - 4.0 ng/mL 4.6 (H) 4.2 (H) 4.7 (H)     Lab Results  Component Value Date   HGBA1C 5.5 04/24/2016    Urinalysis n/a  Assessment & Plan:    1. Prostate cancer ([place]) cT2a Gleason 3+3 prostate cancer, low risk per NCCN guidelines, dx 10/2016 PSA stable as above Discussed repeat prostate biopsy around 10/2017 is confirmatory biopsy- he is agreeable and this plan Reviewed biopsy instructions and risks Hold ASA prior to biopsy for week Requests Valium 10 mg for procedure- will arrange for driver   Return in about 3 months (around 11/02/2017) for prostate biopsy.  Hollice Espy, MD  [facility] [address] [place], [place] [address] [phone]

## 2017-08-02 NOTE — Patient Instructions (Signed)

## 2017-09-02 ENCOUNTER — Other Ambulatory Visit: Payer: Self-pay | Admitting: Cardiovascular Disease

## 2017-09-02 DIAGNOSIS — I6523 Occlusion and stenosis of bilateral carotid arteries: Secondary | ICD-10-CM

## 2017-09-09 ENCOUNTER — Other Ambulatory Visit: Payer: Self-pay | Admitting: Cardiovascular Disease

## 2017-09-19 ENCOUNTER — Ambulatory Visit (INDEPENDENT_AMBULATORY_CARE_PROVIDER_SITE_OTHER): Payer: Medicare Other

## 2017-09-19 VITALS — BP 124/82 | HR 72 | Temp 98.3°F | Resp 16 | Ht 68.0 in | Wt 173.8 lb

## 2017-09-19 DIAGNOSIS — Z0000 Encounter for general adult medical examination without abnormal findings: Secondary | ICD-10-CM | POA: Diagnosis not present

## 2017-09-19 NOTE — Progress Notes (Signed)
Subjective:   [name] [name]. is a 71 y.o. male who presents for Medicare Annual/Subsequent preventive examination.  Review of Systems:  N/A Cardiac Risk Factors include: advanced age (>25men, >59 women);dyslipidemia;hypertension;male gender     Objective:    Vitals: BP 124/82 (BP Location: Right Arm, Patient Position: Sitting, Cuff Size: Normal)   Pulse 72   Temp 98.3 F (36.8 C) (Oral)   Resp 16   Ht 5\' 8"  (1.727 m)   Wt 173 lb 12.8 oz (78.8 kg)   BMI 26.43 kg/m   Body mass index is 26.43 kg/m.  Tobacco History  Smoking Status  . Never Smoker  Smokeless Tobacco  . Never Used     Counseling given: Not Answered   Past Medical History:  Diagnosis Date  . Acute myocardial infarction of other inferior wall, episode of care unspecified May 1994  . Cancer ([place])    skin  . Coronary atherosclerosis of native coronary artery   . History of shingles    waist area/right back side  . Occlusion and stenosis of carotid artery without mention of cerebral infarction   . Other and unspecified hyperlipidemia    Severe  . Parkinson's disease   . Peripheral vascular disease, unspecified ([place])   . Prostate cancer ([place]) 10/2016   low risk  . Shingles August 2015  . Unspecified cerebral artery occlusion with cerebral infarction 2006   CVA  . Unspecified essential hypertension    Past Surgical History:  Procedure Laterality Date  . COLONOSCOPY WITH PROPOFOL N/A 02/11/2017   Procedure: COLONOSCOPY WITH PROPOFOL;  Surgeon: Manya Silvas, MD;  Location: [facility] ENDOSCOPY;  Service: Endoscopy;  Laterality: N/A;  . HERNIA REPAIR  02/17/15   right inguinal hernia , laparoscopic placement of Bard 3-D max mesh  . HERNIA REPAIR  3/316   ventral hernia, umbilical, open repair  . SKIN BIOPSY     skin cancer   Family History  Problem Relation Age of Onset  . Hypertension Mother   . Heart disease Father   . Hypertension Sister   . Hypertension Brother   . Heart disease Other   .  Prostate cancer Neg Hx   . Kidney cancer Neg Hx    History  Sexual Activity  . Sexual activity: Not on file    Outpatient Encounter Prescriptions as of 09/19/2017  Medication Sig  . amLODipine (NORVASC) 10 MG tablet TAKE 1 TABLET BY MOUTH DAILY  . aspirin 162 MG EC tablet Take 162 mg by mouth daily.  Marland Kitchen atorvastatin (LIPITOR) 80 MG tablet TAKE 1 TABLET BY MOUTH DAILY AT 6PM  . benazepril (LOTENSIN) 40 MG tablet TAKE 1 TABLET BY MOUTH DAILY  . carbidopa-levodopa (SINEMET IR) 25-250 MG tablet 3 (three) times daily.   . Omega-3 Fatty Acids (OMEGA 3 PO) Take 1,400 mg by mouth daily.  . [DISCONTINUED] diazepam (VALIUM) 10 MG tablet Take 1 tablet (10 mg total) by mouth every 6 (six) hours as needed for anxiety.   No facility-administered encounter medications on file as of 09/19/2017.     Activities of Daily Living In your present state of health, do you have any difficulty performing the following activities: 09/19/2017  Hearing? N  Vision? Y  Comment difficult to see at times when reading with eye glasses  Difficulty concentrating or making decisions? N  Walking or climbing stairs? N  Dressing or bathing? N  Doing errands, shopping? N  Preparing Food and eating ? N  Using the Toilet? N  In the past six months, have you accidently leaked urine? N  Do you have problems with loss of bowel control? N  Managing your Medications? N  Managing your Finances? N  Housekeeping or managing your Housekeeping? N  Some recent data might be hidden    Patient Care Team: Jerrol Banana., MD as PCP - General (Family Medicine) Jerrol Banana., MD (Family Medicine) Bary Castilla, Forest Gleason, MD (General Surgery) Minna Merritts, MD as Consulting Physician (Cardiology) Hollice Espy, MD as Consulting Physician (Urology) Vladimir Crofts, MD as Consulting Physician (Neurology)   Assessment:     Exercise Activities and Dietary recommendations Current Exercise Habits: Structured exercise  class, Type of exercise: treadmill;strength training/weights, Time (Minutes): 55, Frequency (Times/Week): 6, Weekly Exercise (Minutes/Week): 330, Intensity: Moderate, Exercise limited by: None identified  Goals    . Increase water intake          Recommend to increase fluid intake to 6-8 glasses per day      Fall Risk Fall Risk  09/19/2017 09/18/2016 04/24/2016  Falls in the past year? No No No  Risk for fall due to : Impaired vision - -  Risk for fall due to: Comment needs updated eye exam and eye glass Rx - -   Depression Screen PHQ 2/9 Scores 09/19/2017 09/18/2016 04/24/2016  PHQ - 2 Score 0 0 0    Cognitive Function: declined     6CIT Screen 09/18/2016  What Year? 0 points  What month? 0 points  What time? 3 points  Count back from 20 0 points  Months in reverse 0 points  Repeat phrase 0 points  Total Score 3    Immunization History  Administered Date(s) Administered  . Pneumococcal Conjugate-13 04/24/2016  . Tdap 04/24/2016   Screening Tests Health Maintenance  Topic Date Due  . Hepatitis C Screening  09/26/2017 (Originally 1946/10/17)  . INFLUENZA VACCINE  09/16/2018 (Originally 07/17/2017)  . PNA vac Low Risk Adult (2 of 2 - PPSV23) 09/16/2018 (Originally 04/24/2017)  . COLONOSCOPY  02/11/2022  . TETANUS/TDAP  04/24/2026      Plan:   I have personally reviewed and addressed the Medicare Annual Wellness questionnaire and have noted the following in the patient's chart:  A. Medical and social history B. Use of alcohol, tobacco or illicit drugs  C. Current medications and supplements D. Functional ability and status E.  Nutritional status F.  Physical activity G. Advance directives H. List of other physicians I.  Hospitalizations, surgeries, and ER visits in previous 12 months J.  [place] such as hearing and vision if needed, cognitive and depression L. Referrals and appointments - none  In addition, I have reviewed and discussed with patient certain  preventive protocols, quality metrics, and best practice recommendations. A written personalized care plan for preventive services as well as general preventive health recommendations were provided to patient.  See attached scanned questionnaire for additional information.   Signed,  Aleatha Borer, LPN Nurse Health Advisor   MD Recommendations: None. Declined Pneumovax and Flu vaccines today. Requesting to discuss need for Hep C screening with Dr. Rosanna Randy at next [place].

## 2017-09-19 NOTE — Patient Instructions (Signed)
[name] [name] , Thank you for taking time to come for your Medicare Wellness Visit. I appreciate your ongoing commitment to your health goals. Please review the following plan we discussed and let me know if I can assist you in the future.   Screening recommendations/referrals: Colonoscopy: completed Recommended yearly ophthalmology/optometry visit for glaucoma screening and checkup Recommended yearly dental visit for hygiene and checkup  Vaccinations: Influenza vaccine: declined Pneumococcal vaccine: completed Prevnar 13 04/24/16. Declined Pneomovax today Tdap vaccine: up to date Shingles vaccine: declined. Will check with insurance    Advanced directives: Please bring a copy of your POA (Power of [name] [name]) and/or Living Will to your next appointment.   Conditions/risks identified: Recommend to increase fluid intake to 6-8 glasses per day, Fall rsik prevention discussed  Next appointment: Scheduled to see Dr. Rosanna Randy on 09/26/17 @ 8:15am. Follow annual wellness exam in one year.  Preventive Care 102 Years and Older, Male Preventive care refers to lifestyle choices and visits with your health care provider that can promote health and wellness. What does preventive care include?  A yearly physical exam. This is also called an annual well check.  Dental exams once or twice a year.  Routine eye exams. Ask your health care provider how often you should have your eyes checked.  Personal lifestyle choices, including:  Daily care of your teeth and gums.  Regular physical activity.  Eating a healthy diet.  Avoiding tobacco and drug use.  Limiting alcohol use.  Practicing safe sex.  Taking low doses of aspirin every day.  Taking vitamin and mineral supplements as recommended by your health care provider. What happens during an annual well check? The services and screenings done by your health care provider during your annual well check will depend on your age, overall health,  lifestyle risk factors, and family history of disease. Counseling  Your health care provider may ask you questions about your:  Alcohol use.  Tobacco use.  Drug use.  Emotional well-being.  Home and relationship well-being.  Sexual activity.  Eating habits.  History of falls.  Memory and ability to understand (cognition).  Work and work Statistician. Screening  You may have the following tests or measurements:  Height, weight, and BMI.  Blood pressure.  Lipid and cholesterol levels. These may be checked every 5 years, or more frequently if you are over 44 years old.  Skin check.  Lung cancer screening. You may have this screening every year starting at age 18 if you have a 30-pack-year history of smoking and currently smoke or have quit within the past 15 years.  Fecal occult blood test (FOBT) of the stool. You may have this test every year starting at age 40.  Flexible sigmoidoscopy or colonoscopy. You may have a sigmoidoscopy every 5 years or a colonoscopy every 10 years starting at age 37.  Prostate cancer screening. Recommendations will vary depending on your family history and other risks.  Hepatitis C blood test.  Hepatitis B blood test.  Sexually transmitted disease (STD) testing.  Diabetes screening. This is done by checking your blood sugar (glucose) after you have not eaten for a while (fasting). You may have this done every 1-3 years.  Abdominal aortic aneurysm (AAA) screening. You may need this if you are a current or former smoker.  Osteoporosis. You may be screened starting at age 67 if you are at high risk. Talk with your health care provider about your test results, treatment options, and if necessary, the need for more  tests. Vaccines  Your health care provider may recommend certain vaccines, such as:  Influenza vaccine. This is recommended every year.  Tetanus, diphtheria, and acellular pertussis (Tdap, Td) vaccine. You may need a Td booster  every 10 years.  Zoster vaccine. You may need this after age 18.  Pneumococcal 13-valent conjugate (PCV13) vaccine. One dose is recommended after age 1.  Pneumococcal polysaccharide (PPSV23) vaccine. One dose is recommended after age 14. Talk to your health care provider about which screenings and vaccines you need and how often you need them. This information is not intended to replace advice given to you by your health care provider. Make sure you discuss any questions you have with your health care provider. Document Released: 12/30/2015 Document Revised: 08/22/2016 Document Reviewed: 10/04/2015 Elsevier Interactive Patient Education  2017 [place] Prevention in the Home Falls can cause injuries. They can happen to people of all ages. There are many things you can do to make your home safe and to help prevent falls. What can I do on the outside of my home?  Regularly fix the edges of walkways and driveways and fix any cracks.  Remove anything that might make you trip as you walk through a door, such as a raised step or threshold.  Trim any bushes or trees on the path to your home.  Use bright outdoor lighting.  Clear any walking paths of anything that might make someone trip, such as rocks or tools.  Regularly check to see if handrails are loose or broken. Make sure that both sides of any steps have handrails.  Any raised decks and porches should have guardrails on the edges.  Have any leaves, snow, or ice cleared regularly.  Use sand or salt on walking paths during winter.  Clean up any spills in your garage right away. This includes oil or grease spills. What can I do in the bathroom?  Use night lights.  Install grab bars by the toilet and in the tub and shower. Do not use towel bars as grab bars.  Use non-skid mats or decals in the tub or shower.  If you need to sit down in the shower, use a plastic, non-slip stool.  Keep the floor dry. Clean up any  water that spills on the floor as soon as it happens.  Remove soap buildup in the tub or shower regularly.  Attach bath mats securely with double-sided non-slip rug tape.  Do not have throw rugs and other things on the floor that can make you trip. What can I do in the bedroom?  Use night lights.  Make sure that you have a light by your bed that is easy to reach.  Do not use any sheets or blankets that are too big for your bed. They should not hang down onto the floor.  Have a firm chair that has side arms. You can use this for support while you get dressed.  Do not have throw rugs and other things on the floor that can make you trip. What can I do in the kitchen?  Clean up any spills right away.  Avoid walking on wet floors.  Keep items that you use a lot in easy-to-reach places.  If you need to reach something above you, use a strong step stool that has a grab bar.  Keep electrical cords out of the way.  Do not use floor polish or wax that makes floors slippery. If you must use wax, use non-skid floor  wax.  Do not have throw rugs and other things on the floor that can make you trip. What can I do with my stairs?  Do not leave any items on the stairs.  Make sure that there are handrails on both sides of the stairs and use them. Fix handrails that are broken or loose. Make sure that handrails are as long as the stairways.  Check any carpeting to make sure that it is firmly attached to the stairs. Fix any carpet that is loose or worn.  Avoid having throw rugs at the top or bottom of the stairs. If you do have throw rugs, attach them to the floor with carpet tape.  Make sure that you have a light switch at the top of the stairs and the bottom of the stairs. If you do not have them, ask someone to add them for you. What else can I do to help prevent falls?  Wear shoes that:  Do not have high heels.  Have rubber bottoms.  Are comfortable and fit you well.  Are closed  at the toe. Do not wear sandals.  If you use a stepladder:  Make sure that it is fully opened. Do not climb a closed stepladder.  Make sure that both sides of the stepladder are locked into place.  Ask someone to hold it for you, if possible.  Clearly mark and make sure that you can see:  Any grab bars or handrails.  First and last steps.  Where the edge of each step is.  Use tools that help you move around (mobility aids) if they are needed. These include:  Canes.  Walkers.  Scooters.  Crutches.  Turn on the lights when you go into a dark area. Replace any light bulbs as soon as they burn out.  Set up your furniture so you have a clear path. Avoid moving your furniture around.  If any of your floors are uneven, fix them.  If there are any pets around you, be aware of where they are.  Review your medicines with your doctor. Some medicines can make you feel dizzy. This can increase your chance of falling. Ask your doctor what other things that you can do to help prevent falls. This information is not intended to replace advice given to you by your health care provider. Make sure you discuss any questions you have with your health care provider. Document Released: 09/29/2009 Document Revised: 05/10/2016 Document Reviewed: 01/07/2015 Elsevier Interactive Patient Education  2017 [employer].

## 2017-09-26 ENCOUNTER — Ambulatory Visit (INDEPENDENT_AMBULATORY_CARE_PROVIDER_SITE_OTHER): Payer: Medicare Other | Admitting: Family Medicine

## 2017-09-26 ENCOUNTER — Encounter: Payer: Self-pay | Admitting: Family Medicine

## 2017-09-26 VITALS — BP 108/62 | HR 78 | Temp 97.5°F | Resp 16 | Ht 68.0 in | Wt 177.0 lb

## 2017-09-26 DIAGNOSIS — Z6826 Body mass index (BMI) 26.0-26.9, adult: Secondary | ICD-10-CM

## 2017-09-26 DIAGNOSIS — I6523 Occlusion and stenosis of bilateral carotid arteries: Secondary | ICD-10-CM | POA: Diagnosis not present

## 2017-09-26 DIAGNOSIS — I10 Essential (primary) hypertension: Secondary | ICD-10-CM | POA: Diagnosis not present

## 2017-09-26 DIAGNOSIS — G3184 Mild cognitive impairment, so stated: Secondary | ICD-10-CM

## 2017-09-26 DIAGNOSIS — E782 Mixed hyperlipidemia: Secondary | ICD-10-CM

## 2017-09-26 NOTE — Progress Notes (Signed)
Patient: [name] [name]. Male    DOB: [birth year]   71 y.o.   MRN: [id number] Visit Date: 09/26/2017  Today's Provider: Wilhemena Durie, MD   Chief Complaint  Patient presents with  . Hypertension  . Hyperlipidemia   Subjective:    HPI  Hypertension, follow-up:  BP Readings from Last 3 Encounters:  09/26/17 108/62  09/19/17 124/82  08/02/17 112/62    He was last seen for hypertension 6 months ago.  BP at that visit was 106/58. Management since that visit includes no changes. He reports good compliance with treatment. He is not having side effects.  He is not exercising. He is adherent to low salt diet.   Outside blood pressures are not being checked. He is experiencing none.  Patient denies exertional chest pressure/discomfort, lower extremity edema and palpitations.   Cardiovascular risk factors include dyslipidemia.   Weight trend: stable Wt Readings from Last 3 Encounters:  09/26/17 177 lb (80.3 kg)  09/19/17 173 lb 12.8 oz (78.8 kg)  08/02/17 170 lb (77.1 kg)    Current diet: well balanced    Lipid/Cholesterol, Follow-up:   Last seen for this6 months ago.  Management changes since that visit include no changes. . Last Lipid Panel:    Component Value Date/Time   CHOL 169 04/19/2017 0915   TRIG 59 04/19/2017 0915   HDL 62 04/19/2017 0915   CHOLHDL 2.4 05/27/2013 0916   CHOLHDL 2 04/01/2012 0940   VLDL 7.0 04/01/2012 0940   LDLCALC 95 04/19/2017 0915    Risk factors for vascular disease include hypertension  He reports good compliance with treatment. He is not having side effects.  Current symptoms include none and have been stable. Weight trend: stable Prior visit with dietician: no Current diet: well balanced Current exercise: no regular exercise  Wt Readings from Last 3 Encounters:  09/26/17 177 lb (80.3 kg)  09/19/17 173 lb 12.8 oz (78.8 kg)  08/02/17 170 lb (77.1 kg)    On last visit, patient mentioned that he had a  lower back strain. He reports that he feels much better today. Patient also had some mild cognitive issues. MMSE was done in the office today.  MMSE - Mini Mental State Exam 09/26/2017  Orientation to time 5  Orientation to Place 5  Registration 3  Attention/ Calculation 5  Recall 3  Language- name 2 objects 2  Language- repeat 1  Language- follow 3 step command 3  Language- read & follow direction 1  Write a sentence 1  Copy design 1  Total score 30        Allergies  Allergen Reactions  . Penicillins      Current Outpatient Prescriptions:  .  amLODipine (NORVASC) 10 MG tablet, TAKE 1 TABLET BY MOUTH DAILY, Disp: 90 tablet, Rfl: 3 .  aspirin 162 MG EC tablet, Take 162 mg by mouth daily., Disp: , Rfl:  .  atorvastatin (LIPITOR) 80 MG tablet, TAKE 1 TABLET BY MOUTH DAILY AT 6PM, Disp: 30 tablet, Rfl: 2 .  benazepril (LOTENSIN) 40 MG tablet, TAKE 1 TABLET BY MOUTH DAILY, Disp: 90 tablet, Rfl: 3 .  carbidopa-levodopa (SINEMET IR) 25-250 MG tablet, 3 (three) times daily. , Disp: , Rfl:  .  Omega-3 Fatty Acids (OMEGA 3 PO), Take 1,400 mg by mouth daily., Disp: , Rfl:   Review of Systems  Constitutional: Negative.   HENT: Negative.   Eyes: Negative.   Respiratory: Negative.   Cardiovascular: Negative  for chest pain, palpitations and leg swelling.  Endocrine: Negative.   Musculoskeletal: Positive for myalgias. Negative for arthralgias, back pain and gait problem.  Allergic/Immunologic: Negative.   Neurological: Positive for tremors. Negative for dizziness, speech difficulty, weakness, light-headedness, numbness and headaches.  Psychiatric/Behavioral: Negative.     Social History  Substance Use Topics  . Smoking status: Never Smoker  . Smokeless tobacco: Never Used  . Alcohol use Yes     Comment: 3 glasses of wine/day   Objective:   BP 108/62 (BP Location: Left Arm, Patient Position: Sitting, Cuff Size: Normal)   Pulse 78   Temp (!) 97.5 F (36.4 C)   Resp 16   Ht  5\' 8"  (1.727 m)   Wt 177 lb (80.3 kg)   SpO2 96%   BMI 26.91 kg/m  Vitals:   09/26/17 0820  BP: 108/62  Pulse: 78  Resp: 16  Temp: (!) 97.5 F (36.4 C)  SpO2: 96%  Weight: 177 lb (80.3 kg)  Height: 5\' 8"  (1.727 m)     Physical Exam  Constitutional: He is oriented to person, place, and time. He appears well-developed and well-nourished.  HENT:  Head: Normocephalic and atraumatic.  Eyes: Conjunctivae are normal. No scleral icterus.  Neck: No thyromegaly present.  Cardiovascular: Normal rate, regular rhythm and normal heart sounds.   Pulmonary/Chest: Effort normal and breath sounds normal.  Abdominal: Soft.  Neurological: He is alert and oriented to person, place, and time.  Skin: Skin is warm and dry.  Psychiatric: He has a normal mood and affect. His behavior is normal. Judgment and thought content normal.        Assessment & Plan:     1. Essential hypertension   2. Mixed hyperlipidemia   3. MCI (mild cognitive impairment)   4. BMI 26.0-26.9,adult  5.Parkinsons Disease  6.Prostate Cancer       I have done the exam and reviewed the above chart and it is accurate to the best of my knowledge. Development worker, community has been used in this note in any air is in the dictation or transcription are unintentional.  Wilhemena Durie, MD  [place]

## 2017-10-24 ENCOUNTER — Ambulatory Visit (INDEPENDENT_AMBULATORY_CARE_PROVIDER_SITE_OTHER): Payer: Medicare Other

## 2017-10-24 DIAGNOSIS — I6523 Occlusion and stenosis of bilateral carotid arteries: Secondary | ICD-10-CM | POA: Diagnosis not present

## 2017-10-24 LAB — VAS US CAROTID
LEFT ECA DIAS: -38 cm/s
LEFT VERTEBRAL DIAS: 9 cm/s
LICADDIAS: -25 cm/s
LICAPSYS: -94 cm/s
Left CCA dist dias: 23 cm/s
Left CCA dist sys: 84 cm/s
Left CCA prox dias: 27 cm/s
Left CCA prox sys: 97 cm/s
Left ICA dist sys: -79 cm/s
Left ICA prox dias: -26 cm/s
RIGHT ECA DIAS: -20 cm/s
RIGHT VERTEBRAL DIAS: 13 cm/s
Right CCA prox dias: 21 cm/s
Right CCA prox sys: 137 cm/s
Right cca dist sys: -101 cm/s

## 2017-10-25 DIAGNOSIS — H34232 Retinal artery branch occlusion, left eye: Secondary | ICD-10-CM | POA: Diagnosis not present

## 2017-11-11 DIAGNOSIS — H34232 Retinal artery branch occlusion, left eye: Secondary | ICD-10-CM | POA: Diagnosis not present

## 2017-11-12 ENCOUNTER — Other Ambulatory Visit: Payer: Self-pay | Admitting: Urology

## 2017-11-12 ENCOUNTER — Ambulatory Visit (INDEPENDENT_AMBULATORY_CARE_PROVIDER_SITE_OTHER): Payer: Medicare Other | Admitting: Urology

## 2017-11-12 ENCOUNTER — Encounter: Payer: Self-pay | Admitting: Urology

## 2017-11-12 VITALS — BP 116/70 | HR 63 | Ht 68.0 in | Wt 170.0 lb

## 2017-11-12 DIAGNOSIS — C61 Malignant neoplasm of prostate: Secondary | ICD-10-CM | POA: Diagnosis not present

## 2017-11-12 HISTORY — PX: BIOPSY PROSTATE: PRO28

## 2017-11-12 HISTORY — PX: TRANSRECTAL ULTRASOUND: SHX5146

## 2017-11-12 MED ORDER — LEVOFLOXACIN 500 MG PO TABS
500.0000 mg | ORAL_TABLET | Freq: Once | ORAL | Status: AC
  Administered 2017-11-12: 500 mg via ORAL

## 2017-11-12 MED ORDER — GENTAMICIN SULFATE 40 MG/ML IJ SOLN
80.0000 mg | Freq: Once | INTRAMUSCULAR | Status: AC
  Administered 2017-11-12: 80 mg via INTRAMUSCULAR

## 2017-11-12 NOTE — Progress Notes (Signed)
   11/12/17  CC:  Chief Complaint  Patient presents with  . Prostate Biopsy    HPI: 71 year old male  With cT2a Gleason 3+3 prostate cancer, low risk per NCCN guidelines, dx 10/2016 who returns today for confirmatory prostate biopsy.  His PSA has been essentially stable.  His most recent PSA was 4.7 on 07/2017.  Vitals:   11/12/17 1002  BP: 116/70  Pulse: 63   NED. A&Ox3.   No respiratory distress   Abd soft, NT, ND Normal sphincter tone  Prostate Biopsy Procedure   Informed consent was obtained after discussing risks/benefits of the procedure.  A time out was performed to ensure correct patient identity.  Pre-Procedure: - Last PSA Level: No results found for: PSA - Gentamicin given prophylactically - Levaquin 500 mg administered PO -Transrectal Ultrasound performed revealing a 69 gm prostate -No significant hypoechoic or median lobe noted  Procedure: - Prostate block performed using 10 cc 1% lidocaine and biopsies taken from sextant areas, a total of 12 under ultrasound guidance.  Post-Procedure: - Patient tolerated the procedure well - He was counseled to seek immediate medical attention if experiences any severe pain, significant bleeding, or fevers - Return in one week to discuss biopsy results  Assessment/ Plan:   1. Prostate cancer ([place]) Status post uncomplicated prostate biopsy today Warning symptoms reviewed in detail Will call with prostate biopsy results- if similar to previous biopsy, will continue surveillance on a every 6 month interval.  If progression is noted, will have him come into the office to discuss in further detail. - levofloxacin (LEVAQUIN) tablet 500 mg - gentamicin (GARAMYCIN) injection 80 mg   Return in about 6 months (around 05/12/2018) for PSA/ DRE.  Hollice Espy, MD

## 2017-11-15 LAB — PATHOLOGY REPORT

## 2017-11-18 ENCOUNTER — Other Ambulatory Visit: Payer: Self-pay | Admitting: Urology

## 2017-11-19 DIAGNOSIS — H2513 Age-related nuclear cataract, bilateral: Secondary | ICD-10-CM | POA: Diagnosis not present

## 2017-11-26 ENCOUNTER — Ambulatory Visit (INDEPENDENT_AMBULATORY_CARE_PROVIDER_SITE_OTHER): Payer: Medicare Other | Admitting: Urology

## 2017-11-26 ENCOUNTER — Encounter: Payer: Self-pay | Admitting: Urology

## 2017-11-26 VITALS — BP 122/67 | HR 67 | Ht 68.0 in | Wt 170.0 lb

## 2017-11-26 DIAGNOSIS — C61 Malignant neoplasm of prostate: Secondary | ICD-10-CM

## 2017-11-26 DIAGNOSIS — I6523 Occlusion and stenosis of bilateral carotid arteries: Secondary | ICD-10-CM | POA: Diagnosis not present

## 2017-11-26 NOTE — Progress Notes (Signed)
11/26/2017 2:32 PM   [name] [name]. [birth year] [id number]  Referring provider: Jerrol [name]., MD [address] [place], [place] [address]  Chief Complaint  Patient presents with  . Prostate Cancer    biopsy results    HPI: 71 yo M With a history of low risk prostate cancer managed by active surveillance returns to discuss his follow up pathology from most recent biopsy.  [name] was initially referred for elevated PSA , prostate nodule on recent prostate exam by Dr. Rosanna [name].  His PSA was found to be 4.6 on 09/2016.  He did indeed have a left-sided firm nodule in the mid gland.   Prostate biopsy on 10/31/16 shows Gleason 3+3 in 6/12 cores, 4 on left and 2 on right up to 58%.  He underwent a recent repeat biopsy on 11/12/2017 which showed progression of his disease.  He now has evidence of involving 3 /12 cores in addition to 3/12 cores with Gleason 3+3.  He is a total of 6/12 cores involved, primarily on the left.  This is involving up to 76% of the tissue.  Most recent PSA on 07/2017 was 4.7.  TRUS volume 69 cc.    No family history of prostate cancer.  No significant bone pain.   He does complain today about urinary urgency and occasional urge incontinence.  He does have frequency when he drinks colas.   Nocturia x 1-2, stable.  His stream is OK and he does feel that he is able to empty his bladder.   Unchanged.   IPSS as below.  He also has baseline ED.  PMHx significant for Parkinson's diease (dx 14 years ago without much progression) and MI in '94 on ASA treated medically (followed Dr. Rockey [name]).  Previously cleared to hold aspirin for procedures.  IPSS    Row Name 11/27/17 1400         International Prostate Symptom Score   How often have you had the sensation of not emptying your bladder?  Less than 1 in 5     How often have you had to urinate less than every two hours?  About half the time     How often have you found you stopped and started  again several times when you urinated?  Less than 1 in 5 times     How often have you found it difficult to postpone urination?  More than half the time     How often have you had a weak urinary stream?  Less than 1 in 5 times     How often have you had to strain to start urination?  Not at All     How many times did you typically get up at night to urinate?  2 Times     Total IPSS Score  12       Quality of Life due to urinary symptoms   If you were to spend the rest of your life with your urinary condition just the way it is now how would you feel about that?  Mixed        Score:  1-7 Mild 8-19 Moderate 20-35 Severe   SHIM    Row Name 11/27/17 1432         SHIM: Over the last 6 months:   How do you rate your confidence that you could get and keep an erection?  Moderate     When you had erections with sexual stimulation, how often  were your erections hard enough for penetration (entering your partner)?  Sometimes (about half the time)     During sexual intercourse, how often were you able to maintain your erection after you had penetrated (entered) your partner?  Most Times (much more than half the time)     During sexual intercourse, how difficult was it to maintain your erection to completion of intercourse?  Slightly Difficult     When you attempted sexual intercourse, how often was it satisfactory for you?  Sometimes (about half the time)       SHIM Total Score   SHIM  17        IPSS    Row Name 11/27/17 1400         International Prostate Symptom Score   How often have you had the sensation of not emptying your bladder?  Less than 1 in 5     How often have you had to urinate less than every two hours?  About half the time     How often have you found you stopped and started again several times when you urinated?  Less than 1 in 5 times     How often have you found it difficult to postpone urination?  More than half the time     How often have you had a weak urinary  stream?  Less than 1 in 5 times     How often have you had to strain to start urination?  Not at All     How many times did you typically get up at night to urinate?  2 Times     Total IPSS Score  12       Quality of Life due to urinary symptoms   If you were to spend the rest of your life with your urinary condition just the way it is now how would you feel about that?  Mixed         PMH: Past Medical History:  Diagnosis Date  . Acute myocardial infarction of other inferior wall, episode of care unspecified May 1994  . Cancer ([place])    skin  . Coronary atherosclerosis of native coronary artery   . History of shingles    waist area/right back side  . Occlusion and stenosis of carotid artery without mention of cerebral infarction   . Other and unspecified hyperlipidemia    Severe  . Parkinson's disease   . Peripheral vascular disease, unspecified ([place])   . Prostate cancer ([place]) 10/2016   low risk  . Shingles August 2015  . Unspecified cerebral artery occlusion with cerebral infarction 2006   CVA  . Unspecified essential hypertension     Surgical History: Past Surgical History:  Procedure Laterality Date  . COLONOSCOPY WITH PROPOFOL N/A 02/11/2017   Procedure: COLONOSCOPY WITH PROPOFOL;  Surgeon: [name] Silvas, MD;  Location: [facility] ENDOSCOPY;  Service: Endoscopy;  Laterality: N/A;  . HERNIA REPAIR  02/17/15   right inguinal hernia , laparoscopic placement of Bard 3-D max mesh  . HERNIA REPAIR  3/316   ventral hernia, umbilical, open repair  . SKIN BIOPSY     skin cancer    Home Medications:  Allergies as of 11/26/2017      Reactions   Penicillins       Medication List        Accurate as of 11/26/17 11:59 PM. Always use your most recent med list.          amLODipine  10 MG tablet Commonly known as:  NORVASC TAKE 1 TABLET BY MOUTH DAILY   aspirin 162 MG EC tablet Take 162 mg by mouth daily.   atorvastatin 80 MG tablet Commonly known as:  LIPITOR TAKE 1  TABLET BY MOUTH DAILY AT 6PM   benazepril 40 MG tablet Commonly known as:  LOTENSIN TAKE 1 TABLET BY MOUTH DAILY   carbidopa-levodopa 25-250 MG tablet Commonly known as:  SINEMET IR 3 (three) times daily.   OMEGA 3 PO Take 1,400 mg by mouth daily.       Allergies:  Allergies  Allergen Reactions  . Penicillins     Family History: Family History  Problem Relation Age of Onset  . Hypertension Mother   . Heart disease Father   . Hypertension Sister   . Hypertension Brother   . Heart disease Other   . Prostate cancer Neg Hx   . Kidney cancer Neg Hx     Social History:  reports that  has never smoked. he has never used smokeless tobacco. He reports that he drinks alcohol. He reports that he does not use drugs.  ROS: UROLOGY Frequent Urination?: Yes Hard to postpone urination?: Yes Burning/pain with urination?: No Get up at night to urinate?: No Leakage of urine?: No Urine stream starts and stops?: No Trouble starting stream?: No Do you have to strain to urinate?: No Blood in urine?: No Urinary tract infection?: No Sexually transmitted disease?: No Injury to kidneys or bladder?: No Painful intercourse?: No Weak stream?: No Erection problems?: No Penile pain?: No  Gastrointestinal Nausea?: No Vomiting?: No Indigestion/heartburn?: No Diarrhea?: No Constipation?: No  Constitutional Fever: No Night sweats?: No Weight loss?: No Fatigue?: No  Skin Skin rash/lesions?: No Itching?: No  Eyes Blurred vision?: No Double vision?: No  Ears/Nose/Throat Sore throat?: No Sinus problems?: No  Hematologic/Lymphatic Swollen glands?: No Easy bruising?: No  Cardiovascular Leg swelling?: No Chest pain?: No  Respiratory Cough?: No Shortness of breath?: No  Endocrine Excessive thirst?: No  Musculoskeletal Back pain?: No Joint pain?: No  Neurological Headaches?: No Dizziness?: No  Psychologic Depression?: No Anxiety?: No  Physical Exam: BP  122/67   Pulse 67   Ht 5\' 8"  (1.727 m)   Wt 170 lb (77.1 kg)   BMI 25.85 kg/m   Constitutional:  Alert and oriented, No acute distress. HEENT: [place] AT, moist mucus membranes.  Trachea midline, no masses. Cardiovascular: No clubbing, cyanosis, or edema. Respiratory: Normal respiratory effort, no increased work of breathing. Skin: No rashes, bruises or suspicious lesions. Neurologic: Grossly intact, no focal deficits, moving all 4 extremities. Psychiatric: Normal mood and affect.  Laboratory Data: Lab Results  Component Value Date   WBC 6.5 04/19/2017   HGB 14.5 04/19/2017   HCT 43.5 04/19/2017   MCV 95 04/19/2017   PLT 223 04/19/2017    Lab Results  Component Value Date   CREATININE 0.57 (L) 04/19/2017   Component     Latest Ref Rng & Units 09/18/2016 04/01/2017 07/23/2017  Prostate Specific Ag, Serum     0.0 - 4.0 ng/mL 4.6 (H) 4.2 (H) 4.7 (H)     Lab Results  Component Value Date   HGBA1C 5.5 04/24/2016    Urinalysis n/a  Assessment & Plan:    1. Prostate cancer ([place]) cT2a Gleason 3+3 prostate cancer, low risk per NCCN guidelines, dx 10/2016 Upstaging to Gleason 3+4 on most recent biopsy 11/12/17 Recommend additional treatment given his upstaging Given his age and multiple medical comorbidities, I do  not feel that he is a good surgical candidate I would like to refer him to radiation oncology to discuss this further with Dr. Noreene Filbert for consideration of either brachytherapy seeds or external beam radiation  All questions answered, he is agreeable to this plan   Return in about 6 months (around 05/27/2018) for recheck prostate cancer.  Hollice Espy, MD  [facility] [address], [place] [address] [phone]  I spent 25 min with this patient of which greater than 50% was spent in counseling and coordination of care with the patient.

## 2017-11-27 DIAGNOSIS — D485 Neoplasm of uncertain behavior of skin: Secondary | ICD-10-CM | POA: Diagnosis not present

## 2017-11-27 DIAGNOSIS — X32XXXA Exposure to sunlight, initial encounter: Secondary | ICD-10-CM | POA: Diagnosis not present

## 2017-11-27 DIAGNOSIS — L570 Actinic keratosis: Secondary | ICD-10-CM | POA: Diagnosis not present

## 2017-11-27 DIAGNOSIS — D0421 Carcinoma in situ of skin of right ear and external auricular canal: Secondary | ICD-10-CM | POA: Diagnosis not present

## 2017-12-11 ENCOUNTER — Other Ambulatory Visit: Payer: Self-pay

## 2017-12-11 ENCOUNTER — Ambulatory Visit: Admission: RE | Admit: 2017-12-11 | Payer: Medicare Other | Source: Ambulatory Visit | Admitting: Radiation Oncology

## 2017-12-12 ENCOUNTER — Other Ambulatory Visit: Payer: Self-pay | Admitting: Cardiovascular Disease

## 2017-12-20 DIAGNOSIS — G249 Dystonia, unspecified: Secondary | ICD-10-CM | POA: Diagnosis not present

## 2017-12-20 DIAGNOSIS — G20 Parkinson's disease: Secondary | ICD-10-CM | POA: Diagnosis not present

## 2017-12-25 ENCOUNTER — Encounter: Payer: Self-pay | Admitting: Radiation Oncology

## 2017-12-25 ENCOUNTER — Ambulatory Visit
Admission: RE | Admit: 2017-12-25 | Discharge: 2017-12-25 | Disposition: A | Discharge status: Home / Self Care | Payer: Medicare HMO | Source: Ambulatory Visit | Attending: Radiation Oncology | Admitting: Radiation Oncology

## 2017-12-25 ENCOUNTER — Other Ambulatory Visit: Payer: Self-pay

## 2017-12-25 VITALS — BP 129/80 | HR 65 | Temp 96.6°F | Resp 18 | Wt 177.4 lb

## 2017-12-25 DIAGNOSIS — I10 Essential (primary) hypertension: Secondary | ICD-10-CM | POA: Diagnosis not present

## 2017-12-25 DIAGNOSIS — E785 Hyperlipidemia, unspecified: Secondary | ICD-10-CM | POA: Diagnosis not present

## 2017-12-25 DIAGNOSIS — I252 Old myocardial infarction: Secondary | ICD-10-CM | POA: Insufficient documentation

## 2017-12-25 DIAGNOSIS — I739 Peripheral vascular disease, unspecified: Secondary | ICD-10-CM | POA: Insufficient documentation

## 2017-12-25 DIAGNOSIS — Z8619 Personal history of other infectious and parasitic diseases: Secondary | ICD-10-CM | POA: Diagnosis not present

## 2017-12-25 DIAGNOSIS — I2510 Atherosclerotic heart disease of native coronary artery without angina pectoris: Secondary | ICD-10-CM | POA: Diagnosis not present

## 2017-12-25 DIAGNOSIS — G20 Parkinson's disease: Secondary | ICD-10-CM | POA: Insufficient documentation

## 2017-12-25 DIAGNOSIS — Z7982 Long term (current) use of aspirin: Secondary | ICD-10-CM | POA: Insufficient documentation

## 2017-12-25 DIAGNOSIS — R351 Nocturia: Secondary | ICD-10-CM | POA: Diagnosis not present

## 2017-12-25 DIAGNOSIS — C61 Malignant neoplasm of prostate: Secondary | ICD-10-CM | POA: Diagnosis not present

## 2017-12-25 DIAGNOSIS — Z79899 Other long term (current) drug therapy: Secondary | ICD-10-CM | POA: Insufficient documentation

## 2017-12-25 NOTE — Consult Note (Signed)
NEW PATIENT EVALUATION  Name: [name] [name].  MRN: [id number]  Date:   12/25/2017     DOB: 26-Aug-1946   This 72 y.o. male patient presents to the clinic for initial evaluation of stage II (T2 CN 0 M0) adenocarcinoma the prostate Gleason 6.  REFERRING PHYSICIAN: Jerrol Banana.,*  CHIEF COMPLAINT:  Chief Complaint  Patient presents with  . Prostate Cancer    Pt is here for initial consultation of prostate cancer.     DIAGNOSIS: The encounter diagnosis was Malignant neoplasm of prostate ([place]).   PREVIOUS INVESTIGATIONS:  Pathology reports reviewed Clinical notes reviewed  HPI: Patient is a 72 year old male with Parkinson's disease who is been under active surveillance for adenocarcinoma the prostate Gleason score of 6 diagnosed back in 2017. He recently underwent repeat biopsies November 2018 showing progression of disease now with 6 of 12 cores involving bilateral adenocarcinoma again Gleason score of 6 (3+3). 76% of the tissue was involved most recent PSA done in August 2018 was 4.7. He has a prostatic volume of 69 cc. He is fairly asymptomatic has nocturia 2. No urinary incontinence. He does have some slight urge incontinence. Patient also has had an MI in 1994 and is on aspirin treatment here. He is referred today to radiation oncology for consideration of treatment.    PLANNED TREATMENT REGIMEN: Image guided I MRT radiation therapy  PAST MEDICAL HISTORY:  has a past medical history of Acute myocardial infarction of other inferior wall, episode of care unspecified (May 1994), Cancer [facility]), Coronary atherosclerosis of native coronary artery, History of shingles, Occlusion and stenosis of carotid artery without mention of cerebral infarction, Other and unspecified hyperlipidemia, Parkinson's disease, Peripheral vascular disease, unspecified ([place]), Prostate cancer ([place]) (10/2016), Shingles (August 2015), Unspecified cerebral artery occlusion with cerebral infarction (2006), and  Unspecified essential hypertension.    PAST SURGICAL HISTORY:  Past Surgical History:  Procedure Laterality Date  . COLONOSCOPY WITH PROPOFOL N/A 02/11/2017   Procedure: COLONOSCOPY WITH PROPOFOL;  Surgeon: Manya Silvas, MD;  Location[facility] ENDOSCOPY;  Service: Endoscopy;  Laterality: N/A;  . HERNIA REPAIR  02/17/15   right inguinal hernia , laparoscopic placement of Bard 3-D max mesh  . HERNIA REPAIR  3/316   ventral hernia, umbilical, open repair  . SKIN BIOPSY     skin cancer    FAMILY HISTORY: family history includes Heart disease in his father and other; Hypertension in his brother, mother, and sister.  SOCIAL HISTORY:  reports that  has never smoked. he has never used smokeless tobacco. He reports that he drinks alcohol. He reports that he does not use drugs.  ALLERGIES: Donepezil and Penicillins  MEDICATIONS:  Current Outpatient Medications  Medication Sig Dispense Refill  . amLODipine (NORVASC) 10 MG tablet TAKE 1 TABLET BY MOUTH DAILY 90 tablet 3  . aspirin 162 MG EC tablet Take 162 mg by mouth daily.    Marland Kitchen atorvastatin (LIPITOR) 80 MG tablet TAKE 1 TABLET BY MOUTH DAILY AT 6 PM 30 tablet 2  . benazepril (LOTENSIN) 40 MG tablet TAKE 1 TABLET BY MOUTH DAILY 90 tablet 3  . carbidopa-levodopa (SINEMET IR) 25-250 MG tablet 3 (three) times daily.     . Omega-3 Fatty Acids (OMEGA 3 PO) Take 1,400 mg by mouth daily.     No current facility-administered medications for this encounter.     ECOG PERFORMANCE STATUS:  0 - Asymptomatic  REVIEW OF SYSTEMS:  Patient denies any weight loss, fatigue, weakness, fever, chills or  night sweats. Patient denies any loss of vision, blurred vision. Patient denies any ringing  of the ears or hearing loss. No irregular heartbeat. Patient denies heart murmur or history of fainting. Patient denies any chest pain or pain radiating to her upper extremities. Patient denies any shortness of breath, difficulty breathing at night, cough or hemoptysis.  Patient denies any swelling in the lower legs. Patient denies any nausea vomiting, vomiting of blood, or coffee ground material in the vomitus. Patient denies any stomach pain. Patient states has had normal bowel movements no significant constipation or diarrhea. Patient denies any dysuria, hematuria or significant nocturia. Patient denies any problems walking, swelling in the joints or loss of balance. Patient denies any skin changes, loss of hair or loss of weight. Patient denies any excessive worrying or anxiety or significant depression. Patient denies any problems with insomnia. Patient denies excessive thirst, polyuria, polydipsia. Patient denies any swollen glands, patient denies easy bruising or easy bleeding. Patient denies any recent infections, allergies or URI. Patient "s visual fields have not changed significantly in recent time.    PHYSICAL EXAM: BP 129/80   Pulse 65   Temp (!) 96.6 F (35.9 C)   Resp 18   Wt 177 lb 5.8 oz (80.4 kg)   BMI 26.97 kg/m  Patient is obvious sequela of Parkinson's disease with tremor. On rectal exam rectal sphincter tone is good prostate has some slight nodularity in the left lobe. Sulcus is preserved bilaterally. Seminal vesicle regions appear within normal limits. No other rectal abnormality is identified. Well-developed well-nourished patient in NAD. HEENT reveals PERLA, EOMI, discs not visualized.  Oral cavity is clear. No oral mucosal lesions are identified. Neck is clear without evidence of cervical or supraclavicular adenopathy. Lungs are clear to A&P. Cardiac examination is essentially unremarkable with regular rate and rhythm without murmur rub or thrill. Abdomen is benign with no organomegaly or masses noted. Motor sensory and DTR levels are equal and symmetric in the upper and lower extremities. Cranial nerves II through XII are grossly intact. Proprioception is intact. No peripheral adenopathy or edema is identified. No motor or sensory levels are  noted. Crude visual fields are within normal range.  LABORATORY DATA: Pathology reports reviewed    RADIOLOGY RESULTS: No current films for review. No bone scan is indicated based on his PSA   IMPRESSION: Stage II Gleason 6 adenocarcinoma the prostate in 72 year old male with Parkinson's disease.  PLAN: I agree with urologist assessment this patient would not be an ideal surgical candidate based on his medical comorbidities. His prostate also is larger than the 60 cc we would recommend I-125 interstitial implant. I think he would be an excellent candidate for IM RT radiation therapy to his prostate. According to his [facility] nomogram he is a very low chance of lymph node involvement or seminal vesicle invasion. I would plan on delivering 8000 cGy over 8 weeks using image guided radiation therapy. I've asked urology to placed fiduciary markers in his prostate for daily image guided treatment. Risks and benefits of treatment including increased lower urinary tract symptoms diarrhea fatigue alteration of blood counts skin reaction all were discussed in detail with the patient. Patient seems to comprehend our treatment plan well.  I would like to take this opportunity to thank you for allowing me to participate in the care of your patient.Noreene Filbert, MD

## 2018-01-07 ENCOUNTER — Other Ambulatory Visit: Payer: Medicare Other | Admitting: Urology

## 2018-01-14 ENCOUNTER — Ambulatory Visit: Payer: Medicare HMO

## 2018-02-01 ENCOUNTER — Other Ambulatory Visit: Payer: Self-pay | Admitting: Cardiovascular Disease

## 2018-02-18 DIAGNOSIS — R69 Illness, unspecified: Secondary | ICD-10-CM | POA: Diagnosis not present

## 2018-04-10 ENCOUNTER — Ambulatory Visit (INDEPENDENT_AMBULATORY_CARE_PROVIDER_SITE_OTHER): Payer: Medicare HMO | Admitting: Family Medicine

## 2018-04-10 VITALS — BP 108/70 | HR 62 | Temp 97.5°F | Resp 16 | Wt 179.0 lb

## 2018-04-10 DIAGNOSIS — C61 Malignant neoplasm of prostate: Secondary | ICD-10-CM | POA: Diagnosis not present

## 2018-04-10 DIAGNOSIS — G20A1 Parkinson's disease without dyskinesia, without mention of fluctuations: Secondary | ICD-10-CM

## 2018-04-10 DIAGNOSIS — I10 Essential (primary) hypertension: Secondary | ICD-10-CM | POA: Diagnosis not present

## 2018-04-10 DIAGNOSIS — E782 Mixed hyperlipidemia: Secondary | ICD-10-CM

## 2018-04-10 DIAGNOSIS — G20 Parkinson's disease: Secondary | ICD-10-CM | POA: Diagnosis not present

## 2018-04-10 NOTE — Progress Notes (Signed)
[name] [name].  MRN: [id number] DOB: [birth year]  Subjective:  HPI   The patient is a 72 year old male who presents for follow up of chronic illness.  He was last seen on 09/26/17 and had labs on 04/19/17 which included PSA, Met C, TSH, Lipids and CBC.   Hypertension-The patient does not check his blood pressure himself but states that when he is in other medical offices the readings have been good for some time.   The patient has been diagnosed with prostate cancer and would like to discuss options and possibly get a second opinion for treatment.  Patient would like to have his left ear checked.  He states that when he goes to bed at night he notices drainage.  The patient also stated that his neurologist put him on something for sleep.  He thinks it is a long acting form of Carbidopa.  Patient Active Problem List   Diagnosis Date Noted  . MCI (mild cognitive impairment) 03/27/2017  . Herpes zona 04/19/2016  . HZV (herpes zoster virus) post herpetic neuralgia 04/19/2016  . Umbilical hernia without obstruction and without gangrene 10/15/2014  . Hernia of abdominal cavity 10/15/2014  . PVD 10/02/2010  . WEIGHT GAIN 02/21/2010  . Hyperlipidemia 02/16/2010  . Essential hypertension 02/16/2010  . Old myocardial infarction 02/16/2010  . CAD, NATIVE VESSEL 02/16/2010  . Carotid stenosis 06/14/2009  . Carotid artery obstruction 06/14/2009  . PARKINSON'S DISEASE 05/12/2009    Past Medical History:  Diagnosis Date  . Acute myocardial infarction of other inferior wall, episode of care unspecified May 1994  . Cancer ([place])    skin  . Coronary atherosclerosis of native coronary artery   . History of shingles    waist area/right back side  . Occlusion and stenosis of carotid artery without mention of cerebral infarction   . Other and unspecified hyperlipidemia    Severe  . Parkinson's disease   . Peripheral vascular disease, unspecified ([place])   . Prostate cancer ([place]) 10/2016   low  risk  . Shingles August 2015  . Unspecified cerebral artery occlusion with cerebral infarction 2006   CVA  . Unspecified essential hypertension     Social History   Socioeconomic History  . Marital status: Married    Spouse name: Not on file  . Number of children: Not on file  . Years of education: Not on file  . Highest education level: Not on file  Occupational History  . Occupation: GOLF TEACHER    Employer: [employer]    Comment: Full time  Social Needs  . Financial resource strain: Not on file  . Food insecurity:    Worry: Not on file    Inability: Not on file  . Transportation needs:    Medical: Not on file    Non-medical: Not on file  Tobacco Use  . Smoking status: Never Smoker  . Smokeless tobacco: Never Used  Substance and Sexual Activity  . Alcohol use: Yes    Comment: 3 glasses of wine/day  . Drug use: No  . Sexual activity: Not on file  Lifestyle  . Physical activity:    Days per week: Not on file    Minutes per session: Not on file  . Stress: Not on file  Relationships  . Social connections:    Talks on phone: Not on file    Gets together: Not on file    Attends religious service: Not on file    Active  member of club or organization: Not on file    Attends meetings of clubs or organizations: Not on file    Relationship status: Not on file  . Intimate partner violence:    Fear of current or ex partner: Not on file    Emotionally abused: Not on file    Physically abused: Not on file    Forced sexual activity: Not on file  Other Topics Concern  . Not on file  Social History Narrative   Married   Gets regular exercise    Outpatient Encounter Medications as of 04/10/2018  Medication Sig  . amLODipine (NORVASC) 10 MG tablet TAKE 1 TABLET BY MOUTH DAILY  . aspirin 162 MG EC tablet Take 162 mg by mouth daily.  Marland Kitchen atorvastatin (LIPITOR) 80 MG tablet TAKE ONE TABLET BY MOUTH EVERY EVENING AT 6PM  . benazepril (LOTENSIN) 40 MG tablet TAKE 1  TABLET BY MOUTH DAILY  . carbidopa-levodopa (SINEMET IR) 25-250 MG tablet 3 (three) times daily.   . Omega-3 Fatty Acids (OMEGA 3 PO) Take 1,400 mg by mouth daily.   No facility-administered encounter medications on file as of 04/10/2018.     Allergies  Allergen Reactions  . Donepezil Nausea Only    Sick feeling  . Penicillins     Review of Systems  Constitutional: Negative for fever and malaise/fatigue.  HENT: Positive for ear discharge.   Eyes: Negative.   Respiratory: Negative for cough, shortness of breath and wheezing.   Cardiovascular: Negative for chest pain, palpitations, orthopnea, claudication and leg swelling.  Gastrointestinal: Negative.   Skin: Negative.   Neurological: Negative for dizziness and headaches.  Endo/Heme/Allergies: Negative.   Psychiatric/Behavioral: Negative.     Objective:  BP 108/70 (BP Location: Right Arm, Patient Position: Sitting, Cuff Size: Normal)   Pulse 62   Temp (!) 97.5 F (36.4 C) (Oral)   Resp 16   Wt 179 lb (81.2 kg)   BMI 27.22 kg/m   Physical Exam  Constitutional: He is oriented to person, place, and time and well-developed, well-nourished, and in no distress.  HENT:  Head: Normocephalic and atraumatic.  Eyes: Conjunctivae are normal. No scleral icterus.  Neck: No thyromegaly present.  Cardiovascular: Normal rate, regular rhythm and normal heart sounds.  Pulmonary/Chest: Effort normal and breath sounds normal.  Abdominal: Soft.  Neurological: He is alert and oriented to person, place, and time. Gait normal. GCS score is 15.  Mild parkinsons stigmata.  Skin: Skin is warm and dry.  Psychiatric: Mood, memory, affect and judgment normal.    Assessment and Plan :  1. Essential hypertension  - CBC with Differential/Platelet - Comprehensive metabolic panel - TSH  2. PARKINSON'S DISEASE   3. Mixed hyperlipidemia  - Comprehensive metabolic panel - Lipid Panel With LDL/HDL Ratio  4. Prostate cancer ([place]) - PSA.  Pending radiationoncology. - Ambulatory referral to Radiation Oncology  I have done the exam and reviewed the chart and it is accurate to the best of my knowledge. Development worker, community has been used and  any errors in dictation or transcription are unintentional. Miguel Aschoff M.D. [facility]

## 2018-04-11 ENCOUNTER — Other Ambulatory Visit: Payer: Self-pay

## 2018-04-11 DIAGNOSIS — I10 Essential (primary) hypertension: Secondary | ICD-10-CM | POA: Diagnosis not present

## 2018-04-11 DIAGNOSIS — E782 Mixed hyperlipidemia: Secondary | ICD-10-CM | POA: Diagnosis not present

## 2018-04-11 DIAGNOSIS — C61 Malignant neoplasm of prostate: Secondary | ICD-10-CM | POA: Diagnosis not present

## 2018-04-11 NOTE — Telephone Encounter (Signed)
error 

## 2018-04-12 LAB — COMPREHENSIVE METABOLIC PANEL
ALBUMIN: 4.1 g/dL (ref 3.5–4.8)
ALK PHOS: 51 IU/L (ref 39–117)
ALT: 14 IU/L (ref 0–44)
AST: 25 IU/L (ref 0–40)
Albumin/Globulin Ratio: 1.6 (ref 1.2–2.2)
BILIRUBIN TOTAL: 0.9 mg/dL (ref 0.0–1.2)
BUN / CREAT RATIO: 17 (ref 10–24)
BUN: 10 mg/dL (ref 8–27)
CHLORIDE: 104 mmol/L (ref 96–106)
CO2: 24 mmol/L (ref 20–29)
CREATININE: 0.59 mg/dL — AB (ref 0.76–1.27)
Calcium: 8.9 mg/dL (ref 8.6–10.2)
GFR calc Af Amer: 118 mL/min/{1.73_m2} (ref >59)
GFR calc non Af Amer: 102 mL/min/{1.73_m2} (ref >59)
Globulin, Total: 2.6 g/dL (ref 1.5–4.5)
Glucose: 100 mg/dL — ABNORMAL HIGH (ref 65–99)
Potassium: 4.4 mmol/L (ref 3.5–5.2)
Sodium: 143 mmol/L (ref 134–144)
Total Protein: 6.7 g/dL (ref 6.0–8.5)

## 2018-04-12 LAB — CBC WITH DIFFERENTIAL/PLATELET
BASOS ABS: 0 10*3/uL (ref 0.0–0.2)
Basos: 0 %
EOS (ABSOLUTE): 0.1 10*3/uL (ref 0.0–0.4)
Eos: 1 %
HEMOGLOBIN: 14.1 g/dL (ref 13.0–17.7)
Hematocrit: 43.3 % (ref 37.5–51.0)
IMMATURE GRANS (ABS): 0 10*3/uL (ref 0.0–0.1)
Immature Granulocytes: 0 %
LYMPHS ABS: 1.6 10*3/uL (ref 0.7–3.1)
LYMPHS: 30 %
MCH: 30.9 pg (ref 26.6–33.0)
MCHC: 32.6 g/dL (ref 31.5–35.7)
MCV: 95 fL (ref 79–97)
MONOCYTES: 10 %
Monocytes Absolute: 0.6 10*3/uL (ref 0.1–0.9)
Neutrophils Absolute: 3.2 10*3/uL (ref 1.4–7.0)
Neutrophils: 59 %
PLATELETS: 207 10*3/uL (ref 150–379)
RBC: 4.56 x10E6/uL (ref 4.14–5.80)
RDW: 13.4 % (ref 12.3–15.4)
WBC: 5.4 10*3/uL (ref 3.4–10.8)

## 2018-04-12 LAB — LIPID PANEL WITH LDL/HDL RATIO
CHOLESTEROL TOTAL: 159 mg/dL (ref 100–199)
HDL: 63 mg/dL (ref >39)
LDL Calculated: 87 mg/dL (ref 0–99)
LDL/HDL RATIO: 1.4 ratio (ref 0.0–3.6)
TRIGLYCERIDES: 43 mg/dL (ref 0–149)
VLDL Cholesterol Cal: 9 mg/dL (ref 5–40)

## 2018-04-12 LAB — PSA: Prostate Specific Ag, Serum: 5 ng/mL — ABNORMAL HIGH (ref 0.0–4.0)

## 2018-04-12 LAB — TSH: TSH: 0.713 u[IU]/mL (ref 0.450–4.500)

## 2018-04-14 ENCOUNTER — Telehealth: Payer: Self-pay

## 2018-04-14 NOTE — Telephone Encounter (Signed)
Pt advised.   Thanks,   -Shaquilla Kehres  

## 2018-04-14 NOTE — Telephone Encounter (Signed)
LMTCB 04/14/2018  Thanks,   -Mickel Baas

## 2018-04-14 NOTE — Telephone Encounter (Signed)
-----   Message from Jerrol Banana., MD sent at 04/14/2018  9:48 AM EDT ----- Labs OK--PSA stsable--a little higher

## 2018-04-22 DIAGNOSIS — M545 Low back pain: Secondary | ICD-10-CM | POA: Diagnosis not present

## 2018-04-22 DIAGNOSIS — R200 Anesthesia of skin: Secondary | ICD-10-CM | POA: Diagnosis not present

## 2018-04-22 DIAGNOSIS — G8929 Other chronic pain: Secondary | ICD-10-CM | POA: Diagnosis not present

## 2018-04-22 DIAGNOSIS — E538 Deficiency of other specified B group vitamins: Secondary | ICD-10-CM | POA: Diagnosis not present

## 2018-04-22 DIAGNOSIS — E559 Vitamin D deficiency, unspecified: Secondary | ICD-10-CM | POA: Diagnosis not present

## 2018-04-22 DIAGNOSIS — G20 Parkinson's disease: Secondary | ICD-10-CM | POA: Diagnosis not present

## 2018-05-01 ENCOUNTER — Telehealth: Payer: Self-pay | Admitting: Family Medicine

## 2018-05-01 NOTE — Telephone Encounter (Signed)
I am pretty sure this referral is through Urology.

## 2018-05-01 NOTE — Telephone Encounter (Signed)
corliss with [facility] called stating they received a referral for "Radiation Oncology" however they are not Radiation, they are Chemo and  Surgery.    The Radiation Dept phone # is [phone] and fax # is [phone]  Please confirm which dept he needs to go to.

## 2018-05-01 NOTE — Telephone Encounter (Signed)
Please review

## 2018-05-05 NOTE — Telephone Encounter (Signed)
Patient will talk to his urologist on the 28th when seen

## 2018-05-05 NOTE — Telephone Encounter (Signed)
They probably should order as they.

## 2018-05-05 NOTE — Telephone Encounter (Signed)
Are you saying to order referral to Urology for this or that Urology should order this.

## 2018-05-06 ENCOUNTER — Other Ambulatory Visit: Payer: Self-pay | Admitting: Family Medicine

## 2018-05-06 DIAGNOSIS — C61 Malignant neoplasm of prostate: Secondary | ICD-10-CM

## 2018-05-07 ENCOUNTER — Other Ambulatory Visit: Payer: Self-pay | Admitting: *Deleted

## 2018-05-07 ENCOUNTER — Other Ambulatory Visit: Payer: Medicare HMO

## 2018-05-07 DIAGNOSIS — C61 Malignant neoplasm of prostate: Secondary | ICD-10-CM | POA: Diagnosis not present

## 2018-05-07 DIAGNOSIS — I25118 Atherosclerotic heart disease of native coronary artery with other forms of angina pectoris: Secondary | ICD-10-CM

## 2018-05-07 DIAGNOSIS — I6529 Occlusion and stenosis of unspecified carotid artery: Secondary | ICD-10-CM

## 2018-05-08 LAB — PSA: Prostate Specific Ag, Serum: 5.5 ng/mL — ABNORMAL HIGH (ref 0.0–4.0)

## 2018-05-13 ENCOUNTER — Ambulatory Visit: Payer: Medicare HMO | Admitting: Urology

## 2018-05-13 ENCOUNTER — Encounter: Payer: Self-pay | Admitting: Urology

## 2018-05-13 ENCOUNTER — Ambulatory Visit: Payer: Medicare Other | Admitting: Urology

## 2018-05-13 VITALS — BP 149/92 | HR 66 | Ht 68.0 in | Wt 175.6 lb

## 2018-05-13 DIAGNOSIS — C61 Malignant neoplasm of prostate: Secondary | ICD-10-CM

## 2018-05-13 DIAGNOSIS — M5416 Radiculopathy, lumbar region: Secondary | ICD-10-CM | POA: Diagnosis not present

## 2018-05-13 DIAGNOSIS — M9903 Segmental and somatic dysfunction of lumbar region: Secondary | ICD-10-CM | POA: Diagnosis not present

## 2018-05-13 NOTE — Progress Notes (Signed)
05/13/2018 4:42 PM   [name] [name]. Nov 09, 1946 [id number]  Referring provider: Jerrol Banana., [name] [address] [place], [place] [address]  Chief Complaint  Patient presents with  . Prostate Cancer    HPI: 72 yo M with personal history of prostate cancer  on active surveillance with recent upstaging of his disease to intermediate risk.  He returns the office today for routine follow-up.  Since last visit, he was referred to radiation oncology, Dr. Olena [name] office to discuss radiation versus brachytherapy for his local disease.  Ultimately, he did not feel comfortable with this and declined treatment.  [name] [name] was initially referred for elevated PSA , prostate nodule on recent prostate exam by [name] [name]. His PSA was found to be 4.6 on 09/2016.  He did indeed have a left-sided firm nodule in the mid gland.   Prostate biopsy on 10/31/16 shows Gleason 3+3 in 6/12 cores, 4 on left and 2 on right up to 58%.  He underwent repeat biopsy on 11/12/2017 which showed progression of his disease.  He now has evidence of involving 3 /12 cores of Gleason 3+3 in addition to 3/12 cores with Gleason 3+4.  He is a total of 6/12 cores involved, primarily on the left.  This is involving up to 76% of the tissue.  Most recent PSA PSA on 05/07/2018 continues to rise, now up to 5.5 with slowly upward trend.  TRUS volume 69 cc.    No family history of prostate cancer. No significant bone pain.   He does complain today about urinary urgency and occasional urge incontinence. He does have frequency when he drinks colas. Nocturia x 1-2, stable. His stream is OK and he does feel that he is able to empty his bladder.   Unchanged.   He also has baseline ED.  PMHx significant for Parkinson's diease (dx 14 years ago without much progression) and MI in '94 on ASA treated medically (followed [name] [name]). Previously cleared to hold aspirin for procedures.   PMH: Past Medical  History:  Diagnosis Date  . Acute myocardial infarction of other inferior wall, episode of care unspecified May 1994  . Cancer ([place])    skin  . Coronary atherosclerosis of native coronary artery   . History of shingles    waist area/right back side  . Occlusion and stenosis of carotid artery without mention of cerebral infarction   . Other and unspecified hyperlipidemia    Severe  . Parkinson's disease   . Peripheral vascular disease, unspecified ([place])   . Prostate cancer ([place]) 10/2016   low risk  . Shingles August 2015  . Unspecified cerebral artery occlusion with cerebral infarction 2006   CVA  . Unspecified essential hypertension     Surgical History: Past Surgical History:  Procedure Laterality Date  . COLONOSCOPY WITH PROPOFOL N/A 02/11/2017   Procedure: COLONOSCOPY WITH PROPOFOL;  Surgeon: [name] [name];  Location[facility] END[facility]COPY;  Service: Endoscopy;  Laterality: N/A;  . HERNIA REPAIR  02/17/15   right inguinal hernia , laparoscopic placement of Bard 3-D max mesh  . HERNIA REPAIR  3/316   ventral hernia, umbilical, open repair  . SKIN BIOPSY     skin cancer    Home Medications:  Allergies as of 05/13/2018      Reactions   Donepezil Nausea Only   Sick feeling   Penicillins       Medication List        Accurate as of  05/13/18 11:59 PM. Always use your most recent med list.          amLODipine 10 MG tablet Commonly known as:  NORVASC TAKE 1 TABLET BY MOUTH DAILY   aspirin 162 MG EC tablet Take 162 mg by mouth daily.   atorvastatin 80 MG tablet Commonly known as:  LIPITOR TAKE ONE TABLET BY MOUTH EVERY EVENING AT 6PM   benazepril 40 MG tablet Commonly known as:  LOTENSIN TAKE 1 TABLET BY MOUTH DAILY   carbidopa-levodopa 25-250 MG tablet Commonly known as:  SINEMET IR 3 (three) times daily.   OMEGA 3 PO Take 1,400 mg by mouth daily.   UNABLE TO FIND Med Name: CBD OIL       Allergies:  Allergies  Allergen Reactions  . Donepezil  Nausea Only    Sick feeling  . Penicillins     Family History: Family History  Problem Relation Age of Onset  . Hypertension Mother   . Heart disease Father   . Hypertension Sister   . Hypertension Brother   . Heart disease Other   . Prostate cancer Neg Hx   . Kidney cancer Neg Hx     Social History:  reports that he has never smoked. He has never used smokeless tobacco. He reports that he drinks alcohol. He reports that he does not use drugs.  ROS: UROLOGY Frequent Urination?: Yes Hard to postpone urination?: No Burning/pain with urination?: No Get up at night to urinate?: Yes Leakage of urine?: No Urine stream starts and stops?: No Trouble starting stream?: No Do you have to strain to urinate?: No Blood in urine?: No Urinary tract infection?: No Sexually transmitted disease?: No Injury to kidneys or bladder?: No Painful intercourse?: No Weak stream?: No Erection problems?: No Penile pain?: No  Gastrointestinal Nausea?: No Vomiting?: No Indigestion/heartburn?: No Diarrhea?: No Constipation?: No  Constitutional Fever: No Night sweats?: No Weight loss?: No Fatigue?: No  Skin Skin rash/lesions?: No Itching?: No  Eyes Blurred vision?: No Double vision?: No  Ears/Nose/Throat Sore throat?: No Sinus problems?: No  Hematologic/Lymphatic Swollen glands?: No Easy bruising?: No  Cardiovascular Leg swelling?: No Chest pain?: No  Respiratory Cough?: No Shortness of breath?: No  Endocrine Excessive thirst?: No  Musculoskeletal Back pain?: No Joint pain?: No  Neurological Headaches?: No Dizziness?: No  Psychologic Depression?: No Anxiety?: No  Physical Exam: BP (!) 149/92   Pulse 66   Ht 5\' 8"  (1.727 m)   Wt 175 lb 9.6 oz (79.7 kg)   BMI 26.70 kg/m   Constitutional:  Alert and oriented, No acute distress. HEENT:  AT, moist mucus membranes.  Trachea midline, no masses. Cardiovascular: No clubbing, cyanosis, or edema. Respiratory:  Normal respiratory effort, no increased work of breathing. Skin: No rashes, bruises or suspicious lesions. Neurologic: Grossly intact, no focal deficits, moving all 4 extremities.  Tremor appreciated today. Psychiatric: Normal mood and affect.  Laboratory Data: Lab Results  Component Value Date   WBC 5.4 04/11/2018   HGB 14.1 04/11/2018   HCT 43.3 04/11/2018   MCV 95 04/11/2018   PLT 207 04/11/2018    Lab Results  Component Value Date   CREATININE 0.59 (L) 04/11/2018   Lab Results  Component Value Date   HGBA1C 5.5 04/24/2016   PSA as above  Urinalysis N/A  Pertinent Imaging: N/a  Assessment & Plan:    1. Prostate cancer [facility]) 72 year old male with multiple comorbidities with presumed localized intermediate risk prostate cancer  We had a lengthy discussion  today regarding his treatment options.  He is a poor surgical candidate given his medical comorbidities, age, and history of Parkinson's disease with high risk of severe incontinence.  Ultimately, he is not interested in surgery.  As previously discussed, radiation versus brachytherapy seed placement would be preferred.  We discussed today that the benefit of treating localized prostate cancer will be to prevent pathologic fractures, pain, and urinary obstruction moving forward with a goal of eradication of the disease without recurrence.  Alternatively, if treatment is not desired, could treat with androgen deprivation therapy alone which does have side effects without curative intent.  The side effects including risk of long-term ADT were reviewed today in detail.  Ultimately,he would consider radiation treatment, however, he was just not comfortable here at [facility].  He would like to be referred for second opinion with either Kizzie Fantasia, [facility].  We will arrange for evaluation with radiation oncology in [place] which he is agreeable.  - Ambulatory referral to Radiation Oncology   Return in about 3 months (around  08/13/2018) for psa/ recheck prostate cancer.  Hollice Espy, [name]  [facility] [address] [place], [place] [address] [phone]

## 2018-05-16 DIAGNOSIS — M9903 Segmental and somatic dysfunction of lumbar region: Secondary | ICD-10-CM | POA: Diagnosis not present

## 2018-05-16 DIAGNOSIS — M5416 Radiculopathy, lumbar region: Secondary | ICD-10-CM | POA: Diagnosis not present

## 2018-05-21 ENCOUNTER — Telehealth: Payer: Self-pay | Admitting: Cardiovascular Disease

## 2018-05-21 ENCOUNTER — Other Ambulatory Visit: Payer: Self-pay | Admitting: Cardiovascular Disease

## 2018-05-21 MED ORDER — AMLODIPINE BESYLATE 10 MG PO TABS
10.0000 mg | ORAL_TABLET | Freq: Every day | ORAL | 3 refills | Status: DC
Start: 2018-05-21 — End: 2019-06-08

## 2018-05-21 MED ORDER — BENAZEPRIL HCL 40 MG PO TABS: 40.0000 mg | ORAL_TABLET | Freq: Every day | ORAL | 3 refills | Status: DC

## 2018-05-21 NOTE — Telephone Encounter (Signed)
°*  STAT* If patient is at the pharmacy, call can be transferred to refill team.   1. Which medications need to be refilled? (please list name of each medication and dose if known)  Benazepril (LOTENSIN) 40 MG 1 tablet daily amLODipine (NORVASC) 10 MG 1 tablet daily   2. Which pharmacy/location (including street and city if local pharmacy) is medication to be sent to? [facility] [address]  3. Do they need a 30 day or 90 day supply? 90 day

## 2018-05-22 ENCOUNTER — Other Ambulatory Visit: Payer: Self-pay

## 2018-05-23 MED ORDER — ATORVASTATIN CALCIUM 80 MG PO TABS
ORAL_TABLET | ORAL | 2 refills | Status: DC
Start: 2018-05-23 — End: 2018-06-10

## 2018-06-05 ENCOUNTER — Encounter: Payer: Self-pay | Admitting: Radiation Oncology

## 2018-06-05 NOTE — Progress Notes (Signed)
GU Location of Tumor / Histology: Prostatic Adenocarcinoma  If Prostate Cancer, on 11/13/2017 Gleason Score was (3 + 4), PSA was (5.0), and Prostate Volume ~69 mL.   [name] [name]. presented with signs/symptoms of:  Nocturia x2, frequent urination, and some slight urge incontinence. Prostate biopsy performed November 2017. Gleason score of 3+3 revealed. Patient opted for active surveillance at that time. Repeat PSA on 11/13/2017 revealed progression.   05/07/2018 PSA  5.5  Biopsies on 11/12/2017 revealed:    Past/Anticipated interventions by urology, if any: prostate biopsy, active surveillance, prostate biopsy, referral to radiation oncology   Past/Anticipated interventions by medical oncology, if any: no  Weight changes, if any: no  Bowel/Bladder complaints, if any: Urinary urgency, occasional urge incontinence, frequency when he drinks colas, and baseline ED   Nausea/Vomiting, if any: no  Pain issues, if any:  Occasional right hip and low back pain  SAFETY ISSUES:  Prior radiation? no  Pacemaker/ICD? no  Possible current pregnancy? N/A  Is the patient on methotrexate? no  Current Complaints / other details:   72 year old male with parkinsons. Works as a Firefighter. Resides in [place].   Per Dr. Caryl Pina Brandon's note 05/13/2018: "He is a poor surgical candidate given his medical comorbidities, age, and history of Parkinson's disease with high risk of severe incontinence.  Ultimately, he is not interested in surgery. Ultimately,he would consider radiation treatment, however, he was just not comfortable here at [facility]".

## 2018-06-09 ENCOUNTER — Ambulatory Visit
Admission: RE | Admit: 2018-06-09 | Discharge: 2018-06-09 | Disposition: A | Discharge status: Home / Self Care | Payer: Medicare HMO | Source: Ambulatory Visit | Attending: Radiation Oncology | Admitting: Radiation Oncology

## 2018-06-09 ENCOUNTER — Other Ambulatory Visit: Payer: Self-pay

## 2018-06-09 ENCOUNTER — Encounter: Payer: Self-pay | Admitting: Radiation Oncology

## 2018-06-09 ENCOUNTER — Encounter: Payer: Self-pay | Admitting: Medical Oncology

## 2018-06-09 VITALS — BP 135/77 | HR 64 | Temp 98.6°F | Resp 16 | Wt 175.2 lb

## 2018-06-09 DIAGNOSIS — C61 Malignant neoplasm of prostate: Secondary | ICD-10-CM | POA: Insufficient documentation

## 2018-06-09 DIAGNOSIS — R972 Elevated prostate specific antigen [PSA]: Secondary | ICD-10-CM | POA: Diagnosis not present

## 2018-06-09 NOTE — Progress Notes (Signed)
Radiation Oncology         [phone]) [phone] ________________________________  Initial Outpatient Consultation  Name: [name] [name]. MRN: [id number]  Date: 06/09/2018  DOB: 1945/12/19  AS:[id number], Retia Passe., MD  Hollice Espy, MD   REFERRING PHYSICIAN: Hollice Espy, MD  DIAGNOSIS: 72 y.o. gentleman with intermediate risk, Stage T1c adenocarcinoma of the prostate with Gleason Score of 3+4, and PSA of 5.5.    ICD-10-CM   1. Malignant neoplasm of prostate ([place]) C61    HISTORY OF PRESENT ILLNESS: [name] [name]. is a 72 y.o. male with a diagnosis of prostate cancer. The patient has been under active surveillance for prostatic adenocarcinoma with Gleason 6 since the initial diagnosis in 2017. PSA was measured in August 2018 with a score of 4.7. The patient proceeded to undergo repeat transrectal ultrasound with 12 biopsies of the prostate on 11/13/17. The prostate volume measured 69 cc. Out of the 12 core biopsies, six were positive. The maximum Gleason score was 3+4, and this was seen in left base, left mid, and right base. Gleason 3+3 was seen in left lateral base, left lateral mid, and left lateral apex. The patient's most recent PSA was completed on 05/07/18 with a score of 5.5.    The patient reviewed the biopsy results with his urologist and he underwent consultation with radiation oncology at [facility]. Ultimately the patient was considering seed implant but wanted a second opinion.  PREVIOUS RADIATION THERAPY: No  PAST MEDICAL HISTORY:  Past Medical History:  Diagnosis Date  . Acute myocardial infarction of other inferior wall, episode of care unspecified May 1994  . Cancer ([place])    skin  . Coronary atherosclerosis of native coronary artery   . History of shingles    waist area/right back side  . Occlusion and stenosis of carotid artery without mention of cerebral infarction   . Other and unspecified hyperlipidemia    Severe  . Parkinson's disease   . Peripheral vascular  disease, unspecified ([place])   . Prostate cancer ([place]) 10/2016   low risk  . Shingles August 2015  . Unspecified cerebral artery occlusion with cerebral infarction 2006   CVA  . Unspecified essential hypertension       PAST SURGICAL HISTORY: Past Surgical History:  Procedure Laterality Date  . BIOPSY PROSTATE  11/12/2017  . COLONOSCOPY WITH PROPOFOL N/A 02/11/2017   Procedure: COLONOSCOPY WITH PROPOFOL;  Surgeon: Manya Silvas, MD;  Location[facility] ENDOSCOPY;  Service: Endoscopy;  Laterality: N/A;  . HERNIA REPAIR  02/17/15   right inguinal hernia , laparoscopic placement of Bard 3-D max mesh  . HERNIA REPAIR  3/316   ventral hernia, umbilical, open repair  . SKIN BIOPSY     skin cancer  . TRANSRECTAL ULTRASOUND  11/12/2017    FAMILY HISTORY:  Family History  Problem Relation Age of Onset  . Hypertension Mother   . Heart disease Father   . Hypertension Sister   . Hypertension Brother   . Heart disease Other   . Prostate cancer Neg Hx   . Kidney cancer Neg Hx   . Colon cancer Neg Hx   . Pancreatic cancer Neg Hx   . Breast cancer Neg Hx     SOCIAL HISTORY:  Social History   Socioeconomic History  . Marital status: Married    Spouse name: Not on file  . Number of children: Not on file  . Years of education: Not on file  . Highest education level: Not  on file  Occupational History  . Occupation: GOLF TEACHER    Employer: [employer]    Comment: Full time  Social Needs  . Financial resource strain: Not on file  . Food insecurity:    Worry: Not on file    Inability: Not on file  . Transportation needs:    Medical: Not on file    Non-medical: Not on file  Tobacco Use  . Smoking status: Never Smoker  . Smokeless tobacco: Never Used  Substance and Sexual Activity  . Alcohol use: Yes    Comment: 3 glasses of wine/day  . Drug use: No  . Sexual activity: Not Currently  Lifestyle  . Physical activity:    Days per week: Not on file    Minutes per  session: Not on file  . Stress: Not on file  Relationships  . Social connections:    Talks on phone: Not on file    Gets together: Not on file    Attends religious service: Not on file    Active member of club or organization: Not on file    Attends meetings of clubs or organizations: Not on file    Relationship status: Not on file  . Intimate partner violence:    Fear of current or ex partner: Not on file    Emotionally abused: Not on file    Physically abused: Not on file    Forced sexual activity: Not on file  Other Topics Concern  . Not on file  Social History Narrative   Married   Gets regular exercise    ALLERGIES: Donepezil and Penicillins  MEDICATIONS:  Current Outpatient Medications  Medication Sig Dispense Refill  . amLODipine (NORVASC) 10 MG tablet Take 1 tablet (10 mg total) by mouth daily. 90 tablet 3  . aspirin 162 MG EC tablet Take 162 mg by mouth daily.    . benazepril (LOTENSIN) 40 MG tablet Take 1 tablet (40 mg total) by mouth daily. 90 tablet 3  . carbidopa-levodopa (SINEMET IR) 25-250 MG tablet 3 (three) times daily.     . Omega-3 Fatty Acids (OMEGA 3 PO) Take 1,400 mg by mouth daily.    Marland Kitchen UNABLE TO FIND Med Name: CBD OIL    . atorvastatin (LIPITOR) 80 MG tablet TAKE ONE TABLET BY MOUTH EVERY EVENING AT 6PM 90 tablet 0   No current facility-administered medications for this encounter.     REVIEW OF SYSTEMS:  On review of systems, the patient reports that he is doing well overall. He denies any chest pain, shortness of breath, cough, fevers, chills, night sweats, unintended weight changes. He denies any bowel disturbances, and denies abdominal pain, nausea or vomiting. He denies any new musculoskeletal or joint aches or pains. His IPSS was 19 , indicating moderate urinary symptoms. He is able to complete sexual activity with some attempts. A complete review of systems is obtained and is otherwise negative.    PHYSICAL EXAM:  Wt Readings from Last 3  Encounters:  06/09/18 175 lb 3.2 oz (79.5 kg)  05/13/18 175 lb 9.6 oz (79.7 kg)  04/10/18 179 lb (81.2 kg)   Temp Readings from Last 3 Encounters:  06/09/18 98.6 F (37 C)  04/10/18 (!) 97.5 F (36.4 C) (Oral)  12/25/17 (!) 96.6 F (35.9 C)   BP Readings from Last 3 Encounters:  06/09/18 135/77  05/13/18 (!) 149/92  04/10/18 108/70   Pulse Readings from Last 3 Encounters:  06/09/18 64  05/13/18 66  04/10/18  62   Pain Assessment Pain Score: 0-No pain/10 In general this is a well appearing caucasian male in no acute distress. He's alert and oriented x4 and appropriate throughout the examination. Cardiopulmonary assessment is negative for acute distress and he exhibits normal effort.     KPS = 100  100 - Normal; no complaints; no evidence of disease. 90   - Able to carry on normal activity; minor signs or symptoms of disease. 80   - Normal activity with effort; some signs or symptoms of disease. 85   - Cares for self; unable to carry on normal activity or to do active work. 60   - Requires occasional assistance, but is able to care for most of his personal needs. 50   - Requires considerable assistance and frequent medical care. 57   - Disabled; requires special care and assistance. 61   - Severely disabled; hospital admission is indicated although death not imminent. 15   - Very sick; hospital admission necessary; active supportive treatment necessary. 10   - Moribund; fatal processes progressing rapidly. 0     - Dead  Karnofsky DA, Abelmann Weston, Craver LS and [place] JH [phone]) The use of the nitrogen mustards in the palliative treatment of carcinoma: with particular reference to bronchogenic carcinoma Cancer 1 634-56  LABORATORY DATA:  Lab Results  Component Value Date   WBC 5.4 04/11/2018   HGB 14.1 04/11/2018   HCT 43.3 04/11/2018   MCV 95 04/11/2018   PLT 207 04/11/2018   Lab Results  Component Value Date   NA 143 04/11/2018   K 4.4 04/11/2018   CL 104  04/11/2018   CO2 24 04/11/2018   Lab Results  Component Value Date   ALT 14 04/11/2018   AST 25 04/11/2018   ALKPHOS 51 04/11/2018   BILITOT 0.9 04/11/2018     RADIOGRAPHY: No results found.    IMPRESSION/PLAN: 1. 72 y.o. gentleman with favorable intermediate risk, Stage T1c adenocarcinoma of the prostate with Gleason Score of 3+4, and PSA of 5.5. We discussed the patient's workup and outlines the nature of prostate cancer in this setting. The patient's T stage, Gleason's score, and PSA put him into the intermediate risk group. Accordingly, he is eligible for a variety of potential treatment options including brachytherapy, or  5 1/2 weeks of external radiation. We discussed the available radiation techniques, and focused on the details and logistics and delivery. We discussed and outlined the risks, benefits, short and long-term effects associated with radiotherapy and compared and contrasted these with prostatectomy. The patient is leaning toward external beam radiotherapy and I will contact Dr. Erlene Quan to determine if she places fiducial markers/spaceOar. If this is not available, we could still move forward, but our preference would to at a minimum have fiducial markers. The patient states agreement and understanding. We will be in contact to coordinate treatment once this has been addressed.  In a visit lasting 60 minutes, greater than 50% of the time was spent face to face discussing his case, and coordinating the patient's care.    Carola Rhine, PAC    Tyler Pita, MD  [facility] Direct Dial: [phone]  Fax: [phone] [email or web address]  Skype  [email or web address]    This document serves as a record of services personally performed by Tyler Pita, MD and Shona Simpson, PA-C. It was created on their behalf by Bethann Humble, a trained medical scribe. The creation of this record is based on the scribe's personal observations  and the provider's statements  to them. This document has been checked and approved by the attending provider.

## 2018-06-09 NOTE — Progress Notes (Signed)
See progress note under physician encounter. 

## 2018-06-09 NOTE — Progress Notes (Signed)
Introduced myself to [name] [name] and his wife [name] as the prostate nurse navigator and my role. He states he is not a surgical candidate due to his Parkinson's disease so he is here to learn about his radiation options. He has no family history of prostate cancer. I gave him my business card and will continue to follow. I asked them to call me with questions or concerns.

## 2018-06-10 ENCOUNTER — Other Ambulatory Visit: Payer: Self-pay

## 2018-06-10 ENCOUNTER — Telehealth: Payer: Self-pay | Admitting: Cardiovascular Disease

## 2018-06-10 MED ORDER — ATORVASTATIN CALCIUM 80 MG PO TABS: ORAL_TABLET | ORAL | 0 refills | Status: DC

## 2018-06-10 NOTE — Telephone Encounter (Signed)
atorvastatin (LIPITOR) 80 MG tablet 90 tablet 0 06/10/2018    Sig: TAKE ONE TABLET BY MOUTH EVERY EVENING AT 6PM   Sent to pharmacy as: atorvastatin (LIPITOR) 80 MG tablet   E-Prescribing Status: Receipt confirmed by pharmacy (06/10/2018 3:35 PM EDT)   Ph[facility]acy   [id number] [place], [place]

## 2018-06-10 NOTE — Telephone Encounter (Signed)
°*  STAT* If patient is at the pharmacy, call can be transferred to refill team.   1. Which medications need to be refilled? (please list name of each medication and dose if known) atorvastatin  2. Which pharmacy/location (including street and city if local pharmacy) [facility] [address]   3. Do they need a 30 day or 90 day supply? 90 day

## 2018-06-11 DIAGNOSIS — G20 Parkinson's disease: Secondary | ICD-10-CM | POA: Diagnosis not present

## 2018-06-11 DIAGNOSIS — I2510 Atherosclerotic heart disease of native coronary artery without angina pectoris: Secondary | ICD-10-CM | POA: Diagnosis not present

## 2018-06-11 DIAGNOSIS — Z7982 Long term (current) use of aspirin: Secondary | ICD-10-CM | POA: Diagnosis not present

## 2018-06-11 DIAGNOSIS — E785 Hyperlipidemia, unspecified: Secondary | ICD-10-CM | POA: Diagnosis not present

## 2018-06-11 DIAGNOSIS — Z8249 Family history of ischemic heart disease and other diseases of the circulatory system: Secondary | ICD-10-CM | POA: Diagnosis not present

## 2018-06-11 DIAGNOSIS — Z85828 Personal history of other malignant neoplasm of skin: Secondary | ICD-10-CM | POA: Diagnosis not present

## 2018-06-11 DIAGNOSIS — I10 Essential (primary) hypertension: Secondary | ICD-10-CM | POA: Diagnosis not present

## 2018-06-20 NOTE — Progress Notes (Signed)
Cardiology Office Note  Date:  06/23/2018   ID:  [name] [name]., DOB [birth year], MRN [id number]  PCP:  Jerrol Banana., MD   Chief Complaint  Patient presents with  . other    12 month follow up. Meds reviewed by the pt. verbally. "doing well."     HPI:  [name] [name] is a pleasant 72 year old gentleman with a history of  bilateral moderate carotid disease, 50 to 59% on the right 10/2015 prior MI in 1994 with disease in the distal RCA with medical management  stress test in January 2012 with fixed inferior wall defect,   history of Parkinson's Presents for routine followup of his coronary artery disease and PAD   treadmill 5 to 6 days a week PSA elevated, Seeing oncology Getting ready to start XRT, starting in mid August 5 weeks  Sees Dr. Shah,neurology parkinsons stable Still forgets Balance is good,  Tremor from his Parkinson's ,  stable   dx of prostate CA, PSA elevated 5.0  Overall no complaints. Denies any chest pain or shortness of breath with exertion  EKG personally reviewed by myself on todays visit  shows normal sinus rhythm with rate 63 bpm,  Old inferior MI  Other past medical history Weight 03/2017, 176 pounds   PMH:   has a past medical history of Acute myocardial infarction of other inferior wall, episode of care unspecified (May 1994), Cancer [facility]), Coronary atherosclerosis of native coronary artery, History of shingles, Occlusion and stenosis of carotid artery without mention of cerebral infarction, Other and unspecified hyperlipidemia, Parkinson's disease, Peripheral vascular disease, unspecified ([place]), Prostate cancer ([place]) (10/2016), Shingles (August 2015), Unspecified cerebral artery occlusion with cerebral infarction (2006), and Unspecified essential hypertension.  PSH:    Past Surgical History:  Procedure Laterality Date  . BIOPSY PROSTATE  11/12/2017  . COLONOSCOPY WITH PROPOFOL N/A 02/11/2017   Procedure: COLONOSCOPY WITH PROPOFOL;   Surgeon: Manya Silvas, MD;  Location: [facility] ENDOSCOPY;  Service: Endoscopy;  Laterality: N/A;  . HERNIA REPAIR  02/17/15   right inguinal hernia , laparoscopic placement of Bard 3-D max mesh  . HERNIA REPAIR  3/316   ventral hernia, umbilical, open repair  . SKIN BIOPSY     skin cancer  . TRANSRECTAL ULTRASOUND  11/12/2017    Current Outpatient Medications  Medication Sig Dispense Refill  . amLODipine (NORVASC) 10 MG tablet Take 1 tablet (10 mg total) by mouth daily. 90 tablet 3  . aspirin 162 MG EC tablet Take 162 mg by mouth daily.    Marland Kitchen atorvastatin (LIPITOR) 80 MG tablet TAKE ONE TABLET BY MOUTH EVERY EVENING AT 6PM 90 tablet 0  . benazepril (LOTENSIN) 40 MG tablet Take 1 tablet (40 mg total) by mouth daily. 90 tablet 3  . carbidopa-levodopa (SINEMET CR) 50-200 MG tablet Take 50-200 tablets by mouth at bedtime.    . carbidopa-levodopa (SINEMET IR) 25-250 MG tablet 3 (three) times daily.     . Omega-3 Fatty Acids (OMEGA 3 PO) Take 1,400 mg by mouth daily.    Marland Kitchen UNABLE TO FIND Med Name: CBD OIL     No current facility-administered medications for this visit.      Allergies:   Donepezil and Penicillins   Social History:  The patient  reports that he has never smoked. He has never used smokeless tobacco. He reports that he drinks alcohol. He reports that he does not use drugs.   Family History:   family history includes Heart disease in his  father and other; Hypertension in his brother, mother, and sister.    Review of Systems: Review of Systems  Constitutional: Negative.   Respiratory: Negative.   Cardiovascular: Negative.   Gastrointestinal: Negative.   Musculoskeletal: Negative.   Neurological: Positive for tremors.  Psychiatric/Behavioral: Positive for memory loss.  All other systems reviewed and are negative.    PHYSICAL EXAM: VS:  BP 110/68 (BP Location: Left Arm, Patient Position: Sitting, Cuff Size: Normal)   Pulse 63   Ht 5\' 8"  (1.727 m)   Wt 173 lb 12 oz  (78.8 kg)   BMI 26.42 kg/m  , BMI Body mass index is 26.42 kg/m. Constitutional:  oriented to person, place, and time. No distress.  HENT:  Head: Normocephalic and atraumatic.  Eyes:  no discharge. No scleral icterus.  Neck: Normal range of motion. Neck supple. No JVD present.  Cardiovascular: Normal rate, regular rhythm, normal heart sounds and intact distal pulses. Exam reveals no gallop and no friction rub. No edema No murmur heard. Pulmonary/Chest: Effort normal and breath sounds normal. No stridor. No respiratory distress.  no wheezes.  no rales.  no tenderness.  Abdominal: Soft.  no distension.  no tenderness.  Musculoskeletal: Normal range of motion.  no  tenderness or deformity.  Neurological:  normal muscle tone. Coordination normal. No atrophy Skin: Skin is warm and dry. No rash noted. not diaphoretic.  Psychiatric:  normal mood and affect. behavior is normal. Thought content normal.     Recent Labs: 04/11/2018: ALT 14; BUN 10; Creatinine, Ser 0.59; Hemoglobin 14.1; Platelets 207; Potassium 4.4; Sodium 143; TSH 0.713    Lipid Panel Lab Results  Component Value Date   CHOL 159 04/11/2018   HDL 63 04/11/2018   LDLCALC 87 04/11/2018   TRIG 43 04/11/2018      Wt Readings from Last 3 Encounters:  06/23/18 173 lb 12 oz (78.8 kg)  06/09/18 175 lb 3.2 oz (79.5 kg)  05/13/18 175 lb 9.6 oz (79.7 kg)       ASSESSMENT AND PLAN:  Atherosclerosis of native coronary artery of native heart with stable angina pectoris (HCC) No further workup at this time. Continue current medication regimen.stable, regular exercise with no anginal symptoms  Essential hypertension Long discussion with him as blood pressure borderline low. Was low when he saw Dr. Rosanna Randy April 2019 Recommended he monitor blood pressure at home and if it continues to run low would decrease  his blood pressure pill No changes made at this time  Mixed hyperlipidemia  No changes to the medications were  made. LDL close to goal still slightly above but no changes made to his medications as they're slowly trending downward over the past 5 years with diet and exercise, 3 pound weight loss in the past year  Bilateral carotid artery stenosis  carotid ultrasound November 2018 Stable disease on the right 40-59%  PARKINSON'S DISEASE memory loss, tremor, balance , stable Followed by neurology Recommended he continue his aggressive exercise program  Disposition:   F/U  12 months   Total encounter time more than 25 minutes  Greater than 50% was spent in counseling and coordination of care with the patient    Orders Placed This Encounter  Procedures  . EKG 12-Lead     Signed, Esmond Plants, M.D., Ph.D. 06/23/2018  [place], [place]

## 2018-06-23 ENCOUNTER — Telehealth: Payer: Self-pay | Admitting: Radiation Oncology

## 2018-06-23 ENCOUNTER — Ambulatory Visit: Payer: Medicare HMO | Admitting: Cardiovascular Disease

## 2018-06-23 ENCOUNTER — Encounter: Payer: Self-pay | Admitting: Cardiovascular Disease

## 2018-06-23 VITALS — BP 110/68 | HR 63 | Ht 68.0 in | Wt 173.8 lb

## 2018-06-23 DIAGNOSIS — G20 Parkinson's disease: Secondary | ICD-10-CM

## 2018-06-23 DIAGNOSIS — I25118 Atherosclerotic heart disease of native coronary artery with other forms of angina pectoris: Secondary | ICD-10-CM

## 2018-06-23 DIAGNOSIS — I10 Essential (primary) hypertension: Secondary | ICD-10-CM | POA: Diagnosis not present

## 2018-06-23 DIAGNOSIS — E782 Mixed hyperlipidemia: Secondary | ICD-10-CM

## 2018-06-23 DIAGNOSIS — I6523 Occlusion and stenosis of bilateral carotid arteries: Secondary | ICD-10-CM

## 2018-06-23 DIAGNOSIS — I739 Peripheral vascular disease, unspecified: Secondary | ICD-10-CM | POA: Diagnosis not present

## 2018-06-23 NOTE — Patient Instructions (Signed)
Monitor blood pressure If it runs low, call the office   Medication Instructions:   No medication changes made  Labwork:  No new labs needed  Testing/Procedures:  No further testing at this time   Follow-Up: It was a pleasure seeing you in the office today. Please call us if you have new issues that need to be addressed before your next appt.  [phone]  Your physician wants you to follow-up in: 12 months.  You will receive a reminder letter in the mail two months in advance. If you don't receive a letter, please call our office to schedule the follow-up appointment.  If you need a refill on your cardiac medications before your next appointment, please call your pharmacy.  For educational health videos Log in to : [email or web address] Or : [email or web address], password : [email or web address]

## 2018-06-23 NOTE — Telephone Encounter (Signed)
I called and the patient and he has not yet heard from [facility] about SpaceOAR/Fiducials. 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 I will reach out to our GU Navigator to help facilitate an appt with Dr. Tresa Moore for fiduicals/SpaceOAR gel placement in the office. He would like to start his XRT in August.

## 2018-06-24 ENCOUNTER — Encounter: Payer: Self-pay | Admitting: Medical Oncology

## 2018-06-24 ENCOUNTER — Telehealth: Payer: Self-pay | Admitting: Medical Oncology

## 2018-06-24 NOTE — Telephone Encounter (Signed)
Left message to inform patient that I have reached out to [facility] about an appointment with Dr. Tresa Moore for placement of fiducial markers and SpaceOar,

## 2018-06-24 NOTE — Progress Notes (Signed)
Spoke with [name] [name] at [facility] to set up an appointment for fiducial markers and [place]. Patient would like to begin radiation in August. Patient was seen by [facility] but since he will be receiving radiation here at [facility], he will need a urologist  in [place]. Records faxed to [phone].

## 2018-07-03 ENCOUNTER — Encounter: Payer: Self-pay | Admitting: Radiation Oncology

## 2018-07-03 ENCOUNTER — Telehealth: Payer: Self-pay | Admitting: Radiation Oncology

## 2018-07-03 NOTE — Telephone Encounter (Signed)
Couldn't leave message for pt on VM so I sent him an inbasket.

## 2018-07-15 ENCOUNTER — Telehealth: Payer: Self-pay | Admitting: Medical Oncology

## 2018-07-15 NOTE — Telephone Encounter (Signed)
[name] [name] called to see if we have heard from Dr. Zettie Pho office regarding appointment for gold makers/SpaceOAR. He is planning to be on vacation next week and would like to get his scheduled before he leaves town. I spoke with [name] and she has left numerous voicemails with scheduling at [facility]. I called [facility] and left a message asking them to please contact patient with appointment.

## 2018-07-18 ENCOUNTER — Telehealth: Payer: Self-pay | Admitting: *Deleted

## 2018-07-18 ENCOUNTER — Telehealth: Payer: Self-pay | Admitting: Medical Oncology

## 2018-07-18 NOTE — Telephone Encounter (Signed)
Left message asking if he has been scheduled with Dr. Gerlene [facility]dock [facility] for gold markers and SpaceOar. I asked him to call me back or leave a voicemail and I will forward this information to [place].

## 2018-07-18 NOTE — Telephone Encounter (Signed)
Called patient to inform of fid. markers and space oar placement on 08-07-18 @ [facility] and his sim on August 30 @ 3 pm @ Dr. Johny Shears Office, spoke with patient and he is aware of these appts.

## 2018-07-22 ENCOUNTER — Other Ambulatory Visit: Payer: Self-pay | Admitting: Urology

## 2018-07-22 DIAGNOSIS — C61 Malignant neoplasm of prostate: Secondary | ICD-10-CM

## 2018-08-07 DIAGNOSIS — C61 Malignant neoplasm of prostate: Secondary | ICD-10-CM | POA: Diagnosis not present

## 2018-08-08 ENCOUNTER — Telehealth: Payer: Self-pay | Admitting: *Deleted

## 2018-08-08 NOTE — Telephone Encounter (Signed)
CALLED PATIENT TO INFORM OF SIM AND MRI FOR 08-15-18, SPOKE WITH PATIENT AND HE IS AWARE OF THESE APPTS.

## 2018-08-13 ENCOUNTER — Other Ambulatory Visit: Payer: Self-pay

## 2018-08-13 ENCOUNTER — Telehealth: Payer: Self-pay

## 2018-08-13 DIAGNOSIS — C61 Malignant neoplasm of prostate: Secondary | ICD-10-CM

## 2018-08-13 NOTE — Telephone Encounter (Signed)
Scheduled AWV for 10/01/18 @ 8:40 AM. -MM

## 2018-08-13 NOTE — Telephone Encounter (Signed)
LMTCB and schedule AWV. Last AWV was 09/19/17. -MM

## 2018-08-14 ENCOUNTER — Encounter: Payer: Self-pay | Admitting: Urology

## 2018-08-14 ENCOUNTER — Other Ambulatory Visit: Payer: Medicare HMO

## 2018-08-14 ENCOUNTER — Ambulatory Visit: Payer: Medicare HMO | Admitting: Radiation Oncology

## 2018-08-14 NOTE — Progress Notes (Signed)
  Radiation Oncology         [id number]) [phone] ________________________________  Name: [name] [name]. MRN: [id number]  Date: 08/15/2018  DOB: February 14, 1946  SIMULATION AND TREATMENT PLANNING NOTE    ICD-10-CM   1. Malignant neoplasm of prostate ([place]) C61     DIAGNOSIS:  72 y.o. gentleman with intermediate risk, Stage T1c adenocarcinoma of the prostate with Gleason Score of 3+4, and PSA of 5.5.  NARRATIVE:  The patient was brought to the [facility].  Identity was confirmed.  All relevant records and images related to the planned course of therapy were reviewed.  The patient freely provided informed written consent to proceed with treatment after reviewing the details related to the planned course of therapy. The consent form was witnessed and verified by the simulation staff.  Then, the patient was set-up in a stable reproducible supine position for radiation therapy.  A vacuum lock pillow device was custom fabricated to position his legs in a reproducible immobilized position.  Then, I performed a urethrogram under sterile conditions to identify the prostatic apex.  CT images were obtained.  Surface markings were placed.  The CT images were loaded into the planning software.  Then the prostate target and avoidance structures including the rectum, bladder, bowel and hips were contoured.  Treatment planning then occurred.  The radiation prescription was entered and confirmed.  A total of one complex treatment devices was fabricated. I have requested : Intensity Modulated Radiotherapy (IMRT) is medically necessary for this case for the following reason:  Rectal sparing.Marland Kitchen  PLAN:  The patient will receive 70 Gy in 28 fractions.  ________________________________  Sheral Apley Tammi Klippel, M.D.

## 2018-08-15 ENCOUNTER — Ambulatory Visit
Admission: RE | Admit: 2018-08-15 | Discharge: 2018-08-15 | Disposition: A | Discharge status: Home / Self Care | Payer: Medicare HMO | Source: Ambulatory Visit | Attending: Radiation Oncology | Admitting: Radiation Oncology

## 2018-08-15 ENCOUNTER — Encounter: Payer: Self-pay | Admitting: Medical Oncology

## 2018-08-15 ENCOUNTER — Ambulatory Visit (HOSPITAL_COMMUNITY)
Admission: RE | Admit: 2018-08-15 | Discharge: 2018-08-15 | Disposition: A | Discharge status: Home / Self Care | Payer: Medicare HMO | Source: Ambulatory Visit | Attending: Urology | Admitting: Urology

## 2018-08-15 DIAGNOSIS — C61 Malignant neoplasm of prostate: Secondary | ICD-10-CM

## 2018-08-19 ENCOUNTER — Encounter: Payer: Self-pay | Admitting: Urology

## 2018-08-19 ENCOUNTER — Ambulatory Visit: Payer: Medicare HMO | Admitting: Urology

## 2018-08-19 VITALS — BP 108/63 | HR 66 | Ht 68.0 in | Wt 170.0 lb

## 2018-08-19 DIAGNOSIS — C61 Malignant neoplasm of prostate: Secondary | ICD-10-CM | POA: Diagnosis not present

## 2018-08-19 DIAGNOSIS — R350 Frequency of micturition: Secondary | ICD-10-CM | POA: Diagnosis not present

## 2018-08-19 NOTE — Progress Notes (Signed)
08/19/2018 9:36 AM   [name] [name]. 10-07-1946 [id number]  Referring provider: Jerrol Banana., MD [address] [place], [place] [address]  Chief Complaint  Patient presents with  . Prostate Cancer    3 month follow up    HPI: 72 yo M with personal history of intermediate risk prostate cancer now scheduled to undergo IMRT in the near future.  He had fiducial markers and [facility].  He will be starting radiation Sept 11th.    [name] [name] initiallyreferred for elevated PSA , prostate nodule on recent prostate exam by [name] [name]. His PSA was found to be 4.6 on 09/2016. He did indeed have a left-sided firm nodule in the mid gland.   Prostate biopsy on 10/31/16 showed Gleason 3+3 in 6/12 cores, 4 on left and 2 on right up to 58%.  He underwent repeat biopsy on 11/12/2017 which showed progression of his disease. He now has evidence of involving 3 /12 cores of Gleason 3+3 in addition to 3/12 cores with Gleason 3+4. He is a total of 6/12 cores involved, primarily on the left. This is involving up to 76% of the tissue.  Most recent PSA PSA on 05/07/2018 continues to rise, now up to 5.5 with slowly upward trend.  TRUS volume 69 cc.  He does complain today about urinary urgency and occasional urge incontinence. He does have frequency when he drinks colas. Nocturia x 1-2.  His symptoms are stable.  He also has baseline ED.  PMHx significant for Parkinson's diease (dx 14 years ago without much progression) and MI in '94 on ASA treated medically (followed [name] [name]). Previously cleared to hold aspirin for procedures.   PMH: Past Medical History:  Diagnosis Date  . Acute myocardial infarction of other inferior wall, episode of care unspecified May 1994  . Cancer ([place])    skin  . Coronary atherosclerosis of native coronary artery   . History of shingles    waist area/right back side  . Occlusion and stenosis of carotid artery  without mention of cerebral infarction   . Other and unspecified hyperlipidemia    Severe  . Parkinson's disease   . Peripheral vascular disease, unspecified ([place])   . Prostate cancer ([place]) 10/2016   low risk  . Shingles August 2015  . Unspecified cerebral artery occlusion with cerebral infarction 2006   CVA  . Unspecified essential hypertension     Surgical History: Past Surgical History:  Procedure Laterality Date  . BIOPSY PROSTATE  11/12/2017  . COLONOSCOPY WITH PROPOFOL N/A 02/11/2017   Procedure: COLONOSCOPY WITH PROPOFOL;  Surgeon: [name] Silvas, MD;  Location: [facility] ENDOSCOPY;  Service: Endoscopy;  Laterality: N/A;  . HERNIA REPAIR  02/17/15   right inguinal hernia , laparoscopic placement of Bard 3-D max mesh  . HERNIA REPAIR  3/316   ventral hernia, umbilical, open repair  . SKIN BIOPSY     skin cancer  . TRANSRECTAL ULTRASOUND  11/12/2017    Home Medications:  Allergies as of 08/19/2018      Reactions   Donepezil Nausea Only   Sick feeling   Penicillins       Medication List        Accurate as of 08/19/18  9:36 AM. Always use your most recent med list.          amLODipine 10 MG tablet Commonly known as:  NORVASC Take 1 tablet (10 mg total) by mouth daily.   aspirin 162 MG  EC tablet Take 162 mg by mouth daily.   atorvastatin 80 MG tablet Commonly known as:  LIPITOR TAKE ONE TABLET BY MOUTH EVERY EVENING AT 6PM   benazepril 40 MG tablet Commonly known as:  LOTENSIN Take 1 tablet (40 mg total) by mouth daily.   carbidopa-levodopa 25-250 MG tablet Commonly known as:  SINEMET IR 3 (three) times daily.   carbidopa-levodopa 50-200 MG tablet Commonly known as:  SINEMET CR Take 50-200 tablets by mouth at bedtime.   OMEGA 3 PO Take 1,400 mg by mouth daily.   UNABLE TO FIND Med Name: CBD OIL       Allergies:  Allergies  Allergen Reactions  . Donepezil Nausea Only    Sick feeling  . Penicillins     Family History: Family History  Problem  Relation Age of Onset  . Hypertension Mother   . Heart disease Father   . Hypertension Sister   . Hypertension Brother   . Heart disease Other   . Prostate cancer Neg Hx   . Kidney cancer Neg Hx   . Colon cancer Neg Hx   . Pancreatic cancer Neg Hx   . Breast cancer Neg Hx     Social History:  reports that he has never smoked. He has never used smokeless tobacco. He reports that he drinks alcohol. He reports that he does not use drugs.  ROS: UROLOGY Frequent Urination?: Yes Hard to postpone urination?: No Burning/pain with urination?: No Get up at night to urinate?: Yes Leakage of urine?: No Urine stream starts and stops?: No Trouble starting stream?: No Do you have to strain to urinate?: No Blood in urine?: No Urinary tract infection?: No Sexually transmitted disease?: No Injury to kidneys or bladder?: No Painful intercourse?: No Weak stream?: No Erection problems?: No Penile pain?: No  Gastrointestinal Nausea?: No Vomiting?: No Indigestion/heartburn?: No Diarrhea?: No Constipation?: Yes  Constitutional Fever: No Night sweats?: No Weight loss?: No Fatigue?: Yes  Skin Skin rash/lesions?: No Itching?: No  Eyes Blurred vision?: No Double vision?: No  Ears/Nose/Throat Sore throat?: No Sinus problems?: No  Hematologic/Lymphatic Swollen glands?: No Easy bruising?: No  Cardiovascular Leg swelling?: No Chest pain?: No  Respiratory Cough?: No Shortness of breath?: No  Endocrine Excessive thirst?: No  Musculoskeletal Back pain?: No Joint pain?: No  Neurological Headaches?: No Dizziness?: No  Psychologic Depression?: No Anxiety?: No  Physical Exam: BP 108/63   Pulse 66   Ht 5\' 8"  (1.727 m)   Wt 170 lb (77.1 kg)   BMI 25.85 kg/m   Constitutional:  Alert and oriented, No acute distress. HEENT: [place] AT, moist mucus membranes.  Trachea midline, no masses. Cardiovascular: No clubbing, cyanosis, or edema. Respiratory: Normal respiratory  effort, no increased work of breathing. Skin: No rashes, bruises or suspicious lesions. Neurologic: Grossly intact, no focal deficits, moving all 4 extremities. Psychiatric: Normal mood and affect.  Laboratory Data: Lab Results  Component Value Date   WBC 5.4 04/11/2018   HGB 14.1 04/11/2018   HCT 43.3 04/11/2018   MCV 95 04/11/2018   PLT 207 04/11/2018    Lab Results  Component Value Date   CREATININE 0.59 (L) 04/11/2018   PSA as above   Lab Results  Component Value Date   HGBA1C 5.5 04/24/2016    Urinalysis N/a  Pertinent Imaging: n/a  Assessment & Plan:    1. Prostate cancer ([place]) Scheduled to begin IMRT next week in [place] with Dr. Tammi Klippel He will continue to follow up with me  rather than Dr. Tresa Moore at [facility] after radiation Discussed adjuvant Lupron, 6 mo along with risks and benefits Info given today, unsure if he would like to persue this If he decides to pursue Lupron, plan for nurse visit for 6 mo depo  2. Urinary frequency Urinary symptoms stable   Return in about 6 months (around 02/17/2019) for IPSS, PVR, PSA.  Hollice Espy, MD  [facility] [address] [place], [place] [address] [phone]

## 2018-08-19 NOTE — Patient Instructions (Signed)

## 2018-08-20 LAB — PSA: PROSTATE SPECIFIC AG, SERUM: 9.9 ng/mL — AB (ref 0.0–4.0)

## 2018-08-25 DIAGNOSIS — C61 Malignant neoplasm of prostate: Secondary | ICD-10-CM | POA: Diagnosis not present

## 2018-08-27 ENCOUNTER — Ambulatory Visit
Admission: RE | Admit: 2018-08-27 | Discharge: 2018-08-27 | Disposition: A | Discharge status: Home / Self Care | Payer: Medicare HMO | Source: Ambulatory Visit | Attending: Radiation Oncology | Admitting: Radiation Oncology

## 2018-08-27 DIAGNOSIS — C61 Malignant neoplasm of prostate: Secondary | ICD-10-CM | POA: Diagnosis not present

## 2018-08-28 ENCOUNTER — Ambulatory Visit
Admission: RE | Admit: 2018-08-28 | Discharge: 2018-08-28 | Disposition: A | Discharge status: Home / Self Care | Payer: Medicare HMO | Source: Ambulatory Visit | Attending: Radiation Oncology | Admitting: Radiation Oncology

## 2018-08-28 DIAGNOSIS — C61 Malignant neoplasm of prostate: Secondary | ICD-10-CM | POA: Diagnosis not present

## 2018-08-29 ENCOUNTER — Ambulatory Visit
Admission: RE | Admit: 2018-08-29 | Discharge: 2018-08-29 | Disposition: A | Discharge status: Home / Self Care | Payer: Medicare HMO | Source: Ambulatory Visit | Attending: Radiation Oncology | Admitting: Radiation Oncology

## 2018-08-29 DIAGNOSIS — C61 Malignant neoplasm of prostate: Secondary | ICD-10-CM

## 2018-09-01 ENCOUNTER — Ambulatory Visit
Admission: RE | Admit: 2018-09-01 | Discharge: 2018-09-01 | Disposition: A | Discharge status: Home / Self Care | Payer: Medicare HMO | Source: Ambulatory Visit | Attending: Radiation Oncology | Admitting: Radiation Oncology

## 2018-09-01 DIAGNOSIS — C61 Malignant neoplasm of prostate: Secondary | ICD-10-CM | POA: Diagnosis not present

## 2018-09-02 ENCOUNTER — Ambulatory Visit
Admission: RE | Admit: 2018-09-02 | Discharge: 2018-09-02 | Disposition: A | Discharge status: Home / Self Care | Payer: Medicare HMO | Source: Ambulatory Visit | Attending: Radiation Oncology | Admitting: Radiation Oncology

## 2018-09-02 DIAGNOSIS — C61 Malignant neoplasm of prostate: Secondary | ICD-10-CM | POA: Diagnosis not present

## 2018-09-03 ENCOUNTER — Other Ambulatory Visit: Payer: Self-pay | Admitting: Cardiovascular Disease

## 2018-09-03 ENCOUNTER — Ambulatory Visit
Admission: RE | Admit: 2018-09-03 | Discharge: 2018-09-03 | Disposition: A | Discharge status: Home / Self Care | Payer: Medicare HMO | Source: Ambulatory Visit | Attending: Radiation Oncology | Admitting: Radiation Oncology

## 2018-09-03 DIAGNOSIS — C61 Malignant neoplasm of prostate: Secondary | ICD-10-CM | POA: Diagnosis not present

## 2018-09-04 ENCOUNTER — Ambulatory Visit
Admission: RE | Admit: 2018-09-04 | Discharge: 2018-09-04 | Disposition: A | Discharge status: Home / Self Care | Payer: Medicare HMO | Source: Ambulatory Visit | Attending: Radiation Oncology | Admitting: Radiation Oncology

## 2018-09-04 DIAGNOSIS — C61 Malignant neoplasm of prostate: Secondary | ICD-10-CM | POA: Diagnosis not present

## 2018-09-05 ENCOUNTER — Ambulatory Visit
Admission: RE | Admit: 2018-09-05 | Discharge: 2018-09-05 | Disposition: A | Discharge status: Home / Self Care | Payer: Medicare HMO | Source: Ambulatory Visit | Attending: Radiation Oncology | Admitting: Radiation Oncology

## 2018-09-05 DIAGNOSIS — C61 Malignant neoplasm of prostate: Secondary | ICD-10-CM | POA: Diagnosis not present

## 2018-09-08 ENCOUNTER — Ambulatory Visit
Admission: RE | Admit: 2018-09-08 | Discharge: 2018-09-08 | Disposition: A | Discharge status: Home / Self Care | Payer: Medicare HMO | Source: Ambulatory Visit | Attending: Radiation Oncology | Admitting: Radiation Oncology

## 2018-09-08 DIAGNOSIS — C61 Malignant neoplasm of prostate: Secondary | ICD-10-CM | POA: Diagnosis not present

## 2018-09-09 ENCOUNTER — Ambulatory Visit
Admission: RE | Admit: 2018-09-09 | Discharge: 2018-09-09 | Disposition: A | Discharge status: Home / Self Care | Payer: Medicare HMO | Source: Ambulatory Visit | Attending: Radiation Oncology | Admitting: Radiation Oncology

## 2018-09-09 DIAGNOSIS — C61 Malignant neoplasm of prostate: Secondary | ICD-10-CM | POA: Diagnosis not present

## 2018-09-10 ENCOUNTER — Ambulatory Visit
Admission: RE | Admit: 2018-09-10 | Discharge: 2018-09-10 | Disposition: A | Discharge status: Home / Self Care | Payer: Medicare HMO | Source: Ambulatory Visit | Attending: Radiation Oncology | Admitting: Radiation Oncology

## 2018-09-10 DIAGNOSIS — C61 Malignant neoplasm of prostate: Secondary | ICD-10-CM | POA: Diagnosis not present

## 2018-09-11 ENCOUNTER — Ambulatory Visit
Admission: RE | Admit: 2018-09-11 | Discharge: 2018-09-11 | Disposition: A | Discharge status: Home / Self Care | Payer: Medicare HMO | Source: Ambulatory Visit | Attending: Radiation Oncology | Admitting: Radiation Oncology

## 2018-09-11 DIAGNOSIS — C61 Malignant neoplasm of prostate: Secondary | ICD-10-CM | POA: Diagnosis not present

## 2018-09-12 ENCOUNTER — Ambulatory Visit
Admission: RE | Admit: 2018-09-12 | Discharge: 2018-09-12 | Disposition: A | Discharge status: Home / Self Care | Payer: Medicare HMO | Source: Ambulatory Visit | Attending: Radiation Oncology | Admitting: Radiation Oncology

## 2018-09-12 ENCOUNTER — Encounter: Payer: Self-pay | Admitting: Medical Oncology

## 2018-09-12 DIAGNOSIS — C61 Malignant neoplasm of prostate: Secondary | ICD-10-CM | POA: Diagnosis not present

## 2018-09-15 ENCOUNTER — Ambulatory Visit
Admission: RE | Admit: 2018-09-15 | Discharge: 2018-09-15 | Disposition: A | Discharge status: Home / Self Care | Payer: Medicare HMO | Source: Ambulatory Visit | Attending: Radiation Oncology | Admitting: Radiation Oncology

## 2018-09-15 DIAGNOSIS — C61 Malignant neoplasm of prostate: Secondary | ICD-10-CM | POA: Diagnosis not present

## 2018-09-16 ENCOUNTER — Ambulatory Visit
Admission: RE | Admit: 2018-09-16 | Discharge: 2018-09-16 | Disposition: A | Discharge status: Home / Self Care | Payer: Medicare HMO | Source: Ambulatory Visit | Attending: Radiation Oncology | Admitting: Radiation Oncology

## 2018-09-16 DIAGNOSIS — C61 Malignant neoplasm of prostate: Secondary | ICD-10-CM | POA: Diagnosis not present

## 2018-09-17 ENCOUNTER — Ambulatory Visit
Admission: RE | Admit: 2018-09-17 | Discharge: 2018-09-17 | Disposition: A | Discharge status: Home / Self Care | Payer: Medicare HMO | Source: Ambulatory Visit | Attending: Radiation Oncology | Admitting: Radiation Oncology

## 2018-09-17 DIAGNOSIS — C61 Malignant neoplasm of prostate: Secondary | ICD-10-CM | POA: Diagnosis not present

## 2018-09-18 ENCOUNTER — Ambulatory Visit
Admission: RE | Admit: 2018-09-18 | Discharge: 2018-09-18 | Disposition: A | Discharge status: Home / Self Care | Payer: Medicare HMO | Source: Ambulatory Visit | Attending: Radiation Oncology | Admitting: Radiation Oncology

## 2018-09-18 DIAGNOSIS — C61 Malignant neoplasm of prostate: Secondary | ICD-10-CM | POA: Diagnosis not present

## 2018-09-19 ENCOUNTER — Ambulatory Visit
Admission: RE | Admit: 2018-09-19 | Discharge: 2018-09-19 | Disposition: A | Discharge status: Home / Self Care | Payer: Medicare HMO | Source: Ambulatory Visit | Attending: Radiation Oncology | Admitting: Radiation Oncology

## 2018-09-19 DIAGNOSIS — C61 Malignant neoplasm of prostate: Secondary | ICD-10-CM | POA: Diagnosis not present

## 2018-09-22 ENCOUNTER — Ambulatory Visit
Admission: RE | Admit: 2018-09-22 | Discharge: 2018-09-22 | Disposition: A | Discharge status: Home / Self Care | Payer: Medicare HMO | Source: Ambulatory Visit | Attending: Radiation Oncology | Admitting: Radiation Oncology

## 2018-09-22 DIAGNOSIS — C61 Malignant neoplasm of prostate: Secondary | ICD-10-CM | POA: Diagnosis not present

## 2018-09-23 ENCOUNTER — Ambulatory Visit
Admission: RE | Admit: 2018-09-23 | Discharge: 2018-09-23 | Disposition: A | Discharge status: Home / Self Care | Payer: Medicare HMO | Source: Ambulatory Visit | Attending: Radiation Oncology | Admitting: Radiation Oncology

## 2018-09-23 DIAGNOSIS — C61 Malignant neoplasm of prostate: Secondary | ICD-10-CM | POA: Diagnosis not present

## 2018-09-24 ENCOUNTER — Ambulatory Visit
Admission: RE | Admit: 2018-09-24 | Discharge: 2018-09-24 | Disposition: A | Discharge status: Home / Self Care | Payer: Medicare HMO | Source: Ambulatory Visit | Attending: Radiation Oncology | Admitting: Radiation Oncology

## 2018-09-24 DIAGNOSIS — C61 Malignant neoplasm of prostate: Secondary | ICD-10-CM | POA: Diagnosis not present

## 2018-09-25 ENCOUNTER — Other Ambulatory Visit: Payer: Self-pay | Admitting: Radiation Oncology

## 2018-09-25 ENCOUNTER — Ambulatory Visit
Admission: RE | Admit: 2018-09-25 | Discharge: 2018-09-25 | Disposition: A | Discharge status: Home / Self Care | Payer: Medicare HMO | Source: Ambulatory Visit | Attending: Radiation Oncology | Admitting: Radiation Oncology

## 2018-09-25 DIAGNOSIS — C61 Malignant neoplasm of prostate: Secondary | ICD-10-CM | POA: Diagnosis not present

## 2018-09-25 MED ORDER — TAMSULOSIN HCL 0.4 MG PO CAPS: 0.4000 mg | ORAL_CAPSULE | Freq: Every day | ORAL | 5 refills | Status: DC

## 2018-09-26 ENCOUNTER — Ambulatory Visit
Admission: RE | Admit: 2018-09-26 | Discharge: 2018-09-26 | Disposition: A | Discharge status: Home / Self Care | Payer: Medicare HMO | Source: Ambulatory Visit | Attending: Radiation Oncology | Admitting: Radiation Oncology

## 2018-09-26 DIAGNOSIS — C61 Malignant neoplasm of prostate: Secondary | ICD-10-CM | POA: Diagnosis not present

## 2018-09-29 ENCOUNTER — Ambulatory Visit: Payer: Medicare HMO

## 2018-09-30 ENCOUNTER — Ambulatory Visit
Admission: RE | Admit: 2018-09-30 | Discharge: 2018-09-30 | Disposition: A | Discharge status: Home / Self Care | Payer: Medicare HMO | Source: Ambulatory Visit | Attending: Radiation Oncology | Admitting: Radiation Oncology

## 2018-09-30 DIAGNOSIS — C61 Malignant neoplasm of prostate: Secondary | ICD-10-CM | POA: Diagnosis not present

## 2018-10-01 ENCOUNTER — Ambulatory Visit
Admission: RE | Admit: 2018-10-01 | Discharge: 2018-10-01 | Disposition: A | Discharge status: Home / Self Care | Payer: Medicare HMO | Source: Ambulatory Visit | Attending: Radiation Oncology | Admitting: Radiation Oncology

## 2018-10-01 ENCOUNTER — Ambulatory Visit: Payer: Medicare HMO

## 2018-10-01 DIAGNOSIS — C61 Malignant neoplasm of prostate: Secondary | ICD-10-CM | POA: Diagnosis not present

## 2018-10-02 ENCOUNTER — Ambulatory Visit
Admission: RE | Admit: 2018-10-02 | Discharge: 2018-10-02 | Disposition: A | Discharge status: Home / Self Care | Payer: Medicare HMO | Source: Ambulatory Visit | Attending: Radiation Oncology | Admitting: Radiation Oncology

## 2018-10-02 DIAGNOSIS — C61 Malignant neoplasm of prostate: Secondary | ICD-10-CM | POA: Diagnosis not present

## 2018-10-03 ENCOUNTER — Ambulatory Visit: Payer: Medicare HMO

## 2018-10-06 ENCOUNTER — Ambulatory Visit
Admission: RE | Admit: 2018-10-06 | Discharge: 2018-10-06 | Disposition: A | Discharge status: Home / Self Care | Payer: Medicare HMO | Source: Ambulatory Visit | Attending: Radiation Oncology | Admitting: Radiation Oncology

## 2018-10-06 ENCOUNTER — Ambulatory Visit: Payer: Medicare HMO

## 2018-10-06 DIAGNOSIS — C61 Malignant neoplasm of prostate: Secondary | ICD-10-CM | POA: Diagnosis not present

## 2018-10-07 ENCOUNTER — Ambulatory Visit
Admission: RE | Admit: 2018-10-07 | Discharge: 2018-10-07 | Disposition: A | Discharge status: Home / Self Care | Payer: Medicare HMO | Source: Ambulatory Visit | Attending: Radiation Oncology | Admitting: Radiation Oncology

## 2018-10-07 ENCOUNTER — Encounter: Payer: Self-pay | Admitting: Family Medicine

## 2018-10-07 ENCOUNTER — Ambulatory Visit (INDEPENDENT_AMBULATORY_CARE_PROVIDER_SITE_OTHER): Payer: Medicare HMO | Admitting: Family Medicine

## 2018-10-07 VITALS — BP 104/60 | HR 68 | Temp 97.9°F | Resp 16 | Ht 68.0 in | Wt 173.0 lb

## 2018-10-07 DIAGNOSIS — C61 Malignant neoplasm of prostate: Secondary | ICD-10-CM

## 2018-10-07 DIAGNOSIS — G20 Parkinson's disease: Secondary | ICD-10-CM

## 2018-10-07 DIAGNOSIS — K429 Umbilical hernia without obstruction or gangrene: Secondary | ICD-10-CM | POA: Diagnosis not present

## 2018-10-07 DIAGNOSIS — G20A1 Parkinson's disease without dyskinesia, without mention of fluctuations: Secondary | ICD-10-CM

## 2018-10-07 DIAGNOSIS — I25118 Atherosclerotic heart disease of native coronary artery with other forms of angina pectoris: Secondary | ICD-10-CM

## 2018-10-07 DIAGNOSIS — I6523 Occlusion and stenosis of bilateral carotid arteries: Secondary | ICD-10-CM

## 2018-10-07 DIAGNOSIS — Z23 Encounter for immunization: Secondary | ICD-10-CM

## 2018-10-07 DIAGNOSIS — Z0000 Encounter for general adult medical examination without abnormal findings: Secondary | ICD-10-CM

## 2018-10-07 NOTE — Progress Notes (Signed)
Patient: [name] Harju., Male    DOB: May 09, 1946, 72 y.o.   MRN: [id number] Visit Date: 10/07/2018  Today's Provider: Wilhemena Durie, MD   Chief Complaint  Patient presents with  . Medicare Wellness   Subjective:     Complete Physical [name] Lover. is a 72 y.o. male. He feels fairly well. He reports exercising some. He reports he is sleeping fairly well.  -----------------------------------------------------------   Review of Systems  Constitutional: Negative.   HENT: Negative.   Eyes: Negative.   Respiratory: Negative.   Cardiovascular: Negative.   Gastrointestinal: Negative.   Endocrine: Negative.   Musculoskeletal: Negative.   Skin:       SKs.  Allergic/Immunologic: Negative.   Neurological: Positive for tremors.  Hematological: Negative.   Psychiatric/Behavioral: Negative.     Social History   Socioeconomic History  . Marital status: Married    Spouse name: Not on file  . Number of children: Not on file  . Years of education: Not on file  . Highest education level: Not on file  Occupational History  . Occupation: GOLF TEACHER    Employer: [employer]    Comment: Full time  Social Needs  . Financial resource strain: Not on file  . Food insecurity:    Worry: Not on file    Inability: Not on file  . Transportation needs:    Medical: Not on file    Non-medical: Not on file  Tobacco Use  . Smoking status: Never Smoker  . Smokeless tobacco: Never Used  Substance and Sexual Activity  . Alcohol use: Yes    Comment: 3 glasses of wine/day  . Drug use: No  . Sexual activity: Not Currently  Lifestyle  . Physical activity:    Days per week: Not on file    Minutes per session: Not on file  . Stress: Not on file  Relationships  . Social connections:    Talks on phone: Not on file    Gets together: Not on file    Attends religious service: Not on file    Active member of club or organization: Not on file    Attends meetings  of clubs or organizations: Not on file    Relationship status: Not on file  . Intimate partner violence:    Fear of current or ex partner: Not on file    Emotionally abused: Not on file    Physically abused: Not on file    Forced sexual activity: Not on file  Other Topics Concern  . Not on file  Social History Narrative   Married   Gets regular exercise    Past Medical History:  Diagnosis Date  . Acute myocardial infarction of other inferior wall, episode of care unspecified May 1994  . Cancer ([place])    skin  . Coronary atherosclerosis of native coronary artery   . History of shingles    waist area/right back side  . Occlusion and stenosis of carotid artery without mention of cerebral infarction   . Other and unspecified hyperlipidemia    Severe  . Parkinson's disease   . Peripheral vascular disease, unspecified ([place])   . Prostate cancer ([place]) 10/2016   low risk  . Shingles August 2015  . Unspecified cerebral artery occlusion with cerebral infarction 2006   CVA  . Unspecified essential hypertension      Patient Active Problem List   Diagnosis Date Noted  . Malignant neoplasm of  prostate ([place]) 06/09/2018  . MCI (mild cognitive impairment) 03/27/2017  . Herpes zona 04/19/2016  . HZV (herpes zoster virus) post herpetic neuralgia 04/19/2016  . Umbilical hernia without obstruction and without gangrene 10/15/2014  . Hernia of abdominal cavity 10/15/2014  . PVD 10/02/2010  . WEIGHT GAIN 02/21/2010  . Hyperlipidemia 02/16/2010  . Essential hypertension 02/16/2010  . Old myocardial infarction 02/16/2010  . CAD, NATIVE VESSEL 02/16/2010  . Carotid stenosis 06/14/2009  . Carotid artery obstruction 06/14/2009  . PARKINSON'S DISEASE 05/12/2009    Past Surgical History:  Procedure Laterality Date  . BIOPSY PROSTATE  11/12/2017  . COLONOSCOPY WITH PROPOFOL N/A 02/11/2017   Procedure: COLONOSCOPY WITH PROPOFOL;  Surgeon: Manya Silvas, MD;  Location: [facility] ENDOSCOPY;   Service: Endoscopy;  Laterality: N/A;  . HERNIA REPAIR  02/17/15   right inguinal hernia , laparoscopic placement of Bard 3-D max mesh  . HERNIA REPAIR  3/316   ventral hernia, umbilical, open repair  . SKIN BIOPSY     skin cancer  . TRANSRECTAL ULTRASOUND  11/12/2017    His family history includes Heart disease in his father and other; Hypertension in his brother, mother, and sister. There is no history of Prostate cancer, Kidney cancer, Colon cancer, Pancreatic cancer, or Breast cancer.      Current Outpatient Medications:  .  amLODipine (NORVASC) 10 MG tablet, Take 1 tablet (10 mg total) by mouth daily., Disp: 90 tablet, Rfl: 3 .  aspirin 162 MG EC tablet, Take 162 mg by mouth daily., Disp: , Rfl:  .  atorvastatin (LIPITOR) 80 MG tablet, TAKE ONE TABLET BY MOUTH EVERY EVENING AT 6PM, Disp: 90 tablet, Rfl: 3 .  benazepril (LOTENSIN) 40 MG tablet, Take 1 tablet (40 mg total) by mouth daily., Disp: 90 tablet, Rfl: 3 .  carbidopa-levodopa (SINEMET IR) 25-250 MG tablet, 3 (three) times daily. , Disp: , Rfl:  .  Omega-3 Fatty Acids (OMEGA 3 PO), Take 1,400 mg by mouth daily., Disp: , Rfl:  .  tamsulosin (FLOMAX) 0.4 MG CAPS capsule, Take 1 capsule (0.4 mg total) by mouth daily after supper., Disp: 30 capsule, Rfl: 5 .  UNABLE TO FIND, Med Name: CBD OIL, Disp: , Rfl:   Patient Care Team: Jerrol Banana., MD as PCP - General (Family Medicine) Jerrol Banana., MD (Family Medicine) Byrnett, Forest Gleason, MD (General Surgery) Minna Merritts, MD as Consulting Physician (Cardiology) Hollice Espy, MD as Consulting Physician (Urology) Vladimir Crofts, MD as Consulting Physician (Neurology)     Objective:   Vitals: BP 104/60 (BP Location: Left Arm, Patient Position: Sitting, Cuff Size: Normal)   Pulse 68   Temp 97.9 F (36.6 C) (Oral)   Resp 16   Ht 5\' 8"  (1.727 m)   Wt 173 lb (78.5 kg)   BMI 26.30 kg/m   Physical Exam  Constitutional: He is oriented to person, place,  and time. He appears well-developed and well-nourished.  HENT:  Head: Normocephalic and atraumatic.  Right Ear: External ear normal.  Left Ear: External ear normal.  Nose: Nose normal.  Mouth/Throat: Oropharynx is clear and moist.  Eyes: Pupils are equal, round, and reactive to light. Conjunctivae are normal. No scleral icterus.  Neck: No thyromegaly present.  Cardiovascular: Normal rate, regular rhythm, normal heart sounds and intact distal pulses.  Pulmonary/Chest: Effort normal and breath sounds normal.  Abdominal: Soft.  Musculoskeletal: He exhibits no edema.  Lymphadenopathy:    He has no cervical adenopathy.  Neurological: He  is alert and oriented to person, place, and time.  Skin: Skin is warm and dry.  SKs of face.  Psychiatric: He has a normal mood and affect. His behavior is normal. Judgment and thought content normal.    Activities of Daily Living No flowsheet data found.  Fall Risk Assessment Fall Risk  06/09/2018 09/19/2017 09/18/2016 04/24/2016  Falls in the past year? No No No No  Risk for fall due to : - Impaired vision - -  Risk for fall due to: Comment - needs updated eye exam and eye glass Rx - -     Depression Screen PHQ 2/9 Scores 06/09/2018 09/19/2017 09/18/2016 04/24/2016  PHQ - 2 Score 0 0 0 0    Cognitive Testing - 6-CIT  Correct? Score   What year is it? yes 0 0 or 4  What month is it? yes 0 0 or 3  Memorize:    Pia Mau,  42,  [address]  [place],      What time is it? (within 1 hour) yes 0 0 or 3  Count backwards from 20 yes 0 0, 2, or 4  Name the months of the year yes 0 0, 2, or 4  Repeat name & address above yes 0 0, 2, 4, 6, 8, or 10       TOTAL SCORE  0/28   Interpretation:  Normal  Normal (0-7) Abnormal (8-28)       Assessment & Plan:    Annual Physical Reviewed patient's Family Medical History Reviewed and updated list of patient's medical providers Assessment of cognitive impairment was done Assessed patient's functional  ability Established a written schedule for health screening [place] Completed and Reviewed  Exercise Activities and Dietary recommendations Goals    . Increase water intake     Recommend to increase fluid intake to 6-8 glasses per day       Immunization History  Administered Date(s) Administered  . Pneumococcal Conjugate-13 04/24/2016  . Tdap 04/24/2016    Health Maintenance  Topic Date Due  . Hepatitis C Screening  06/19/1946  . PNA vac Low Risk Adult (2 of 2 - PPSV23) 04/24/2017  . INFLUENZA VACCINE  07/17/2018  . COLONOSCOPY  02/11/2022  . TETANUS/TDAP  04/24/2026     Discussed health benefits of physical activity, and encouraged him to engage in regular exercise appropriate for his age and condition.  1. Encounter for annual physical exam   2. Need for influenza vaccination  - Flu vaccine HIGH DOSE PF (Fluzone High dose)  3. Need for 23-polyvalent pneumococcal polysaccharide vaccine  - Pneumococcal polysaccharide vaccine 23-valent greater than or equal to 2yo subcutaneous/IM  4. Atherosclerosis of native coronary artery of native heart with stable angina pectoris ([place])   5. Bilateral carotid artery stenosis   6. PARKINSON'S DISEASE   7. Malignant neoplasm of prostate ([place])   8. Umbilical hernia without obstruction and without gangrene      ------------------------------------------------------------------------------------------------------------   I have done the exam and reviewed the above chart and it is accurate to the best of my knowledge. Development worker, community has been used in this note in any air is in the dictation or transcription are unintentional.  Wilhemena Durie, MD  [place]

## 2018-10-09 ENCOUNTER — Encounter: Payer: Self-pay | Admitting: Family Medicine

## 2018-10-13 ENCOUNTER — Encounter: Payer: Self-pay | Admitting: Radiation Oncology

## 2018-10-13 NOTE — Progress Notes (Signed)
°  Radiation Oncology         [phone]) [phone] ________________________________  Name: [name] [name]. MRN: [id number]  Date: 10/13/2018  DOB: 08-29-1946  End of Treatment Note  Diagnosis:   72 y.o. gentleman with intermediate risk, Stage T1c adenocarcinoma of the prostate with Gleason Score of 3+4, and PSA of 5.5.     Indication for treatment:  Curative, Definitive Radiotherapy       Radiation treatment dates:   08/27/18 - 10/07/18  Site/dose:   The prostate was treated to 70 Gy in 28 fractions of 2.5 Gy  Beams/energy:   The patient was treated with IMRT using volumetric arc therapy delivering 6 MV X-rays to clockwise and counterclockwise circumferential arcs with a 90 degree collimator offset to avoid dose scalloping.  Image guidance was performed with daily cone beam CT prior to each fraction to align to gold markers in the prostate and assure proper bladder and rectal fill volumes.  Immobilization was achieved with BodyFix custom mold.  Narrative: The patient tolerated radiation treatment relatively well.  He denied pain and leakage, and reported urgency and fatigue throughout treatment. He reported increased nocturia (up from x2-3 to x4), stream intermittency, and dysuria as well as intermittent instances of diarrhea, incomplete bladder emptying, and urinary hesitancy that resolved on their own by end of treatment.  Plan: The patient has completed radiation treatment. He will return to radiation oncology clinic for routine followup in one month. I advised him to call or return sooner if he has any questions or concerns related to his recovery or treatment. ________________________________  Sheral Apley. Tammi Klippel, M.D.  This document serves as a record of services personally performed by Tyler Pita, MD. It was created on his behalf by Wilburn Mylar, a trained medical scribe. The creation of this record is based on the scribe's personal observations and the provider's statements to them.  This document has been checked and approved by the attending provider.

## 2018-10-23 DIAGNOSIS — G20 Parkinson's disease: Secondary | ICD-10-CM | POA: Diagnosis not present

## 2018-10-23 DIAGNOSIS — R200 Anesthesia of skin: Secondary | ICD-10-CM | POA: Diagnosis not present

## 2018-10-23 DIAGNOSIS — R4189 Other symptoms and signs involving cognitive functions and awareness: Secondary | ICD-10-CM | POA: Diagnosis not present

## 2018-10-23 DIAGNOSIS — G4700 Insomnia, unspecified: Secondary | ICD-10-CM | POA: Diagnosis not present

## 2018-10-23 DIAGNOSIS — G249 Dystonia, unspecified: Secondary | ICD-10-CM | POA: Diagnosis not present

## 2018-10-23 DIAGNOSIS — E538 Deficiency of other specified B group vitamins: Secondary | ICD-10-CM | POA: Diagnosis not present

## 2018-10-23 DIAGNOSIS — E559 Vitamin D deficiency, unspecified: Secondary | ICD-10-CM | POA: Diagnosis not present

## 2018-10-28 ENCOUNTER — Ambulatory Visit (INDEPENDENT_AMBULATORY_CARE_PROVIDER_SITE_OTHER): Payer: Medicare HMO

## 2018-10-28 DIAGNOSIS — I6529 Occlusion and stenosis of unspecified carotid artery: Secondary | ICD-10-CM | POA: Diagnosis not present

## 2018-10-30 ENCOUNTER — Telehealth: Payer: Self-pay | Admitting: *Deleted

## 2018-10-30 DIAGNOSIS — I6523 Occlusion and stenosis of bilateral carotid arteries: Secondary | ICD-10-CM

## 2018-10-30 NOTE — Telephone Encounter (Signed)
-----   Message from Minna Merritts, MD sent at 10/28/2018  5:59 PM EST ----- Carotid ultrasound Mild bilateral disease less than 39%  Avascular right lobe thyroid mixed density structure, 0.7 cm x 1.0 cm. Would recommend designated thyroid ultrasound, we can order if he would like or he can talk with primary care

## 2018-10-30 NOTE — Telephone Encounter (Signed)
No answer. Left message to call back.   

## 2018-11-04 DIAGNOSIS — C61 Malignant neoplasm of prostate: Secondary | ICD-10-CM | POA: Diagnosis not present

## 2018-11-05 DIAGNOSIS — E538 Deficiency of other specified B group vitamins: Secondary | ICD-10-CM | POA: Diagnosis not present

## 2018-11-05 NOTE — Telephone Encounter (Signed)
Results called to pt. Pt verbalized understanding of results and to contact his PCP, Dr Rosanna Randy, to pursue the thyroid ultrasound as advised. Carotid report says follow up studies in 24 months. Repeat order entered.

## 2018-11-05 NOTE — Telephone Encounter (Signed)
No answer. Left message to call back.   

## 2018-11-05 NOTE — Telephone Encounter (Signed)
Patient returning call for results carotid

## 2018-11-06 ENCOUNTER — Ambulatory Visit
Admission: RE | Admit: 2018-11-06 | Discharge: 2018-11-06 | Disposition: A | Discharge status: Home / Self Care | Payer: Medicare HMO | Source: Ambulatory Visit | Attending: Urology | Admitting: Urology

## 2018-11-06 ENCOUNTER — Encounter: Payer: Self-pay | Admitting: Urology

## 2018-11-06 ENCOUNTER — Other Ambulatory Visit: Payer: Self-pay

## 2018-11-06 VITALS — BP 105/62 | HR 73 | Temp 97.9°F | Resp 18 | Wt 174.4 lb

## 2018-11-06 DIAGNOSIS — Z923 Personal history of irradiation: Secondary | ICD-10-CM | POA: Diagnosis not present

## 2018-11-06 DIAGNOSIS — Z79899 Other long term (current) drug therapy: Secondary | ICD-10-CM | POA: Insufficient documentation

## 2018-11-06 DIAGNOSIS — C61 Malignant neoplasm of prostate: Secondary | ICD-10-CM

## 2018-11-06 DIAGNOSIS — Z7982 Long term (current) use of aspirin: Secondary | ICD-10-CM | POA: Insufficient documentation

## 2018-11-06 NOTE — Progress Notes (Signed)
Radiation Oncology         [phone]) [phone] ________________________________  Name: [name] [name]. MRN: [id number]  Date: 11/06/2018  DOB: [birth year]  Post Treatment Note  CC: Jerrol Banana., MD  Jerrol Banana.,*  Diagnosis:   72 y.o. gentleman with intermediate risk, Stage T1c adenocarcinoma of the prostate with Gleason Score of 3+4, and PSA of 5.5.     Interval Since Last Radiation:  4 weeks  08/27/18 - 10/07/18:   The prostate was treated to 70 Gy in 28 fractions of 2.5 Gy  Narrative:  The patient returns today for routine follow-up.  He tolerated radiation treatment relatively well.  He denied pain and leakage, and reported urgency and fatigue throughout treatment. He reported increased nocturia (up from x2-3 to x4), stream intermittency, and dysuria as well as intermittent instances of diarrhea, incomplete bladder emptying, and urinary hesitancy that resolved on their own by end of treatment.                        On review of systems, the patient states that he is doing very well overall.  His lower urinary tract symptoms are gradually improving and he is pleased with his progress to date.  His current IPSS score is 13 indicating mild to moderate lower urinary tract symptoms with increased urgency and nocturia x3.  He specifically denies dysuria, gross hematuria, straining to void or incomplete bladder emptying.  He continues taking Flomax 0.4 mg p.o. nightly which he tolerates well.  He reports a healthy appetite and is maintaining his weight.  He denies abdominal pain, nausea, vomiting, diarrhea or constipation.  ALLERGIES:  is allergic to donepezil and penicillins.  Meds: Current Outpatient Medications  Medication Sig Dispense Refill  . amLODipine (NORVASC) 10 MG tablet Take 1 tablet (10 mg total) by mouth daily. 90 tablet 3  . aspirin 162 MG EC tablet Take 162 mg by mouth daily.    Marland Kitchen atorvastatin (LIPITOR) 80 MG tablet TAKE ONE TABLET BY MOUTH EVERY EVENING AT 6PM  90 tablet 3  . benazepril (LOTENSIN) 40 MG tablet Take 1 tablet (40 mg total) by mouth daily. 90 tablet 3  . carbidopa-levodopa (SINEMET IR) 25-250 MG tablet 3 (three) times daily.     . Omega-3 Fatty Acids (OMEGA 3 PO) Take 1,400 mg by mouth daily.    . tamsulosin (FLOMAX) 0.4 MG CAPS capsule Take 1 capsule (0.4 mg total) by mouth daily after supper. 30 capsule 5  . Vitamin D, Ergocalciferol, (DRISDOL) 1.25 MG (50000 UT) CAPS capsule Take by mouth.    Marland Kitchen UNABLE TO FIND Med Name: CBD OIL     No current facility-administered medications for this encounter.     Physical Findings:  weight is 174 lb 6.4 oz (79.1 kg). His oral temperature is 97.9 F (36.6 C). His blood pressure is 105/62 and his pulse is 73. His respiration is 18.  Pain Assessment Pain Score: 0-No pain/10 In general this is a well appearing Caucasian male in no acute distress.  He's alert and oriented x4 and appropriate throughout the examination. Cardiopulmonary assessment is negative for acute distress and he exhibits normal effort.   Lab Findings: Lab Results  Component Value Date   WBC 5.4 04/11/2018   HGB 14.1 04/11/2018   HCT 43.3 04/11/2018   MCV 95 04/11/2018   PLT 207 04/11/2018     Radiographic Findings: Vas US Carotid  Result Date: 10/28/2018 Carotid Arterial Duplex  Study Indications:       Carotid artery disease and patient denies cerebrovascular                    symptoms. Risk Factors:      Hypertension, hyperlipidemia, no history of smoking, coronary                    artery disease, PAD. Comparison Study:  10/24/17 showed RICA velocity 947/09 cm/sec, and LICA 62/83                    cm/sec. Performing Technologist: [employer] BS, RVT, RDCS  Examination Guidelines: A complete evaluation includes B-mode imaging, spectral Doppler, color Doppler, and power Doppler as needed of all accessible portions of each vessel. Bilateral testing is considered an integral part of a complete examination. Limited  examinations for reoccurring indications may be performed as noted.  Right Carotid Findings: +----------+--------+--------+--------+------------+---------+           PSV cm/sEDV cm/sStenosisDescribe    Comments  +----------+--------+--------+--------+------------+---------+ CCA Prox  125     0                                     +----------+--------+--------+--------+------------+---------+ CCA Distal113     23                                    +----------+--------+--------+--------+------------+---------+ ICA Prox  136     25              calcific    Shadowing +----------+--------+--------+--------+------------+---------+ ICA Mid   165     37      1-39%   heterogenous          +----------+--------+--------+--------+------------+---------+ ICA Distal136     31                                    +----------+--------+--------+--------+------------+---------+ ECA       187     15              calcific              +----------+--------+--------+--------+------------+---------+ +----------+--------+-------+----------------+-------------------+           PSV cm/sEDV cmsDescribe        Arm Pressure (mmHG) +----------+--------+-------+----------------+-------------------+ Subclavian206     0      Multiphasic, [id number]                 +----------+--------+-------+----------------+-------------------+ +---------+--------+--+--------+-+---------+ VertebralPSV cm/s47EDV cm/s9Antegrade +---------+--------+--+--------+-+---------+ Avascular right lobe thyroid mixed denstiy structure, 0.7 cm x 1.0 cm.Cannot duplicate previous RICA velocities. Comparing images, previous study appears to be over-gained.  Left Carotid Findings: +----------+--------+--------+--------+-------------------------+---------+           PSV cm/sEDV cm/sStenosisDescribe                 Comments  +----------+--------+--------+--------+-------------------------+---------+ CCA Prox  116      21                                                 +----------+--------+--------+--------+-------------------------+---------+ CCA Distal118     22  heterogenous and calcificShadowing +----------+--------+--------+--------+-------------------------+---------+ ICA Prox  82      15      1-39%                                      +----------+--------+--------+--------+-------------------------+---------+ ICA Mid   68      21                                                 +----------+--------+--------+--------+-------------------------+---------+ ICA Distal74      24                                                 +----------+--------+--------+--------+-------------------------+---------+ ECA       189     12                                                 +----------+--------+--------+--------+-------------------------+---------+ +----------+--------+--------+----------------+-------------------+ SubclavianPSV cm/sEDV cm/sDescribe        Arm Pressure (mmHG) +----------+--------+--------+----------------+-------------------+           207     0       Multiphasic, [id number]                 +----------+--------+--------+----------------+-------------------+ +---------+--------+--+--------+-+---------+ VertebralPSV cm/s39EDV cm/s0Antegrade +---------+--------+--+--------+-+---------+  Summary: Right Carotid: Velocities in the right ICA are consistent with a 1-39% stenosis.                Avascular right thyroid cyst, stable in appearance. Left Carotid: Velocities in the left ICA are consistent with a 1-39% stenosis. Vertebrals:  Bilateral vertebral arteries demonstrate antegrade flow. Subclavians: Normal flow hemodynamics were seen in bilateral subclavian              arteries. *See table(s) above for measurements and observations. Suggest follow up study in 24 months given the plaque. Electronically signed by Ena Dawley MD on 10/28/2018 at  2:08:05 PM.    Final     Impression/Plan: 1. 72 y.o. gentleman with intermediate risk, Stage T1c adenocarcinoma of the prostate with Gleason Score of 3+4, and PSA of 5.5.     He will continue to follow up with urology for ongoing PSA determinations and has an appointment scheduled with Dr. Tresa Moore on 11/18/18 and Dr. Erlene Quan 02/17/19. He understands what to expect with regards to PSA monitoring going forward. I will look forward to following his response to treatment via correspondence with urology, and would be happy to continue to participate in his care if clinically indicated. I talked to the patient about what to expect in the future, including his risk for erectile dysfunction and rectal bleeding. I encouraged him to call or return to the office if he has any questions regarding his previous radiation or possible radiation side effects. He was comfortable with this plan and will follow up as needed.    Nicholos Johns, PA-C

## 2018-11-07 DIAGNOSIS — Z85820 Personal history of malignant melanoma of skin: Secondary | ICD-10-CM | POA: Diagnosis not present

## 2018-11-07 DIAGNOSIS — Z85828 Personal history of other malignant neoplasm of skin: Secondary | ICD-10-CM | POA: Diagnosis not present

## 2018-11-07 DIAGNOSIS — D485 Neoplasm of uncertain behavior of skin: Secondary | ICD-10-CM | POA: Diagnosis not present

## 2018-11-07 DIAGNOSIS — C44529 Squamous cell carcinoma of skin of other part of trunk: Secondary | ICD-10-CM | POA: Diagnosis not present

## 2018-11-07 DIAGNOSIS — X32XXXA Exposure to sunlight, initial encounter: Secondary | ICD-10-CM | POA: Diagnosis not present

## 2018-11-07 DIAGNOSIS — Z08 Encounter for follow-up examination after completed treatment for malignant neoplasm: Secondary | ICD-10-CM | POA: Diagnosis not present

## 2018-11-07 DIAGNOSIS — L570 Actinic keratosis: Secondary | ICD-10-CM | POA: Diagnosis not present

## 2018-11-11 DIAGNOSIS — E538 Deficiency of other specified B group vitamins: Secondary | ICD-10-CM | POA: Diagnosis not present

## 2018-11-18 DIAGNOSIS — C61 Malignant neoplasm of prostate: Secondary | ICD-10-CM | POA: Diagnosis not present

## 2018-11-19 DIAGNOSIS — E538 Deficiency of other specified B group vitamins: Secondary | ICD-10-CM | POA: Diagnosis not present

## 2018-11-19 NOTE — Telephone Encounter (Deleted)
Pt was seen in the office today for his AWV and states that he forgot to ask Dr Rosanna Randy some questions when he was in last week. First, pt states he discussed insomnia with Dr Rosanna Randy but nothing came from it. Pt states his Lorazepam was cut back to 0.5mg  (from 1 mg) and with the trouble he has sleeping, he would like to know if this can be increased back to 1mg ? Pt states he does not have trouble falling asleep when he goes to bed, but rather when he gets up in the middle of the night to use the bathroom, he cant go back to sleep after that.  Secondly, pt wanted to know if he could get his Meclizine prescription sent to [facility] for 90 days. Pt states if so he can get it for free or at a discounted price.  Please advise, thank you. -MM

## 2018-11-19 NOTE — Telephone Encounter (Signed)
This encounter was created in error - please disregard.

## 2018-11-20 ENCOUNTER — Ambulatory Visit: Payer: Medicare HMO

## 2018-12-03 DIAGNOSIS — E538 Deficiency of other specified B group vitamins: Secondary | ICD-10-CM | POA: Diagnosis not present

## 2018-12-19 DIAGNOSIS — D045 Carcinoma in situ of skin of trunk: Secondary | ICD-10-CM | POA: Diagnosis not present

## 2018-12-23 ENCOUNTER — Ambulatory Visit (INDEPENDENT_AMBULATORY_CARE_PROVIDER_SITE_OTHER): Payer: Medicare HMO

## 2018-12-23 VITALS — BP 106/54 | HR 64 | Temp 97.8°F | Ht 68.0 in | Wt 174.6 lb

## 2018-12-23 DIAGNOSIS — Z0000 Encounter for general adult medical examination without abnormal findings: Secondary | ICD-10-CM

## 2018-12-23 NOTE — Patient Instructions (Addendum)
[name] [name] , Thank you for taking time to come for your Medicare Wellness Visit. I appreciate your ongoing commitment to your health goals. Please review the following plan we discussed and let me know if I can assist you in the future.   Screening recommendations/referrals: Colonoscopy: Up to date, due 01/2022 Recommended yearly ophthalmology/optometry visit for glaucoma screening and checkup Recommended yearly dental visit for hygiene and checkup  Vaccinations: Influenza vaccine: Up to date Pneumococcal vaccine: Completed series Tdap vaccine: Up to date, due 04/2021 Shingles vaccine: Pt declines today.     Advanced directives: Advance directive discussed with you today. I have provided a copy for you to complete at home and have notarized. Once this is complete please bring a copy in to our office so we can scan it into your chart.  Conditions/risks identified: Continue increasing water intake and reach the goal of at least 6-8 8 oz glasses every day.   Next appointment: 04/09/19 with Dr Rosanna Randy.   Preventive Care 73 Years and Older, Male Preventive care refers to lifestyle choices and visits with your health care provider that can promote health and wellness. What does preventive care include?  A yearly physical exam. This is also called an annual well check.  Dental exams once or twice a year.  Routine eye exams. Ask your health care provider how often you should have your eyes checked.  Personal lifestyle choices, including:  Daily care of your teeth and gums.  Regular physical activity.  Eating a healthy diet.  Avoiding tobacco and drug use.  Limiting alcohol use.  Practicing safe sex.  Taking low doses of aspirin every day.  Taking vitamin and mineral supplements as recommended by your health care provider. What happens during an annual well check? The services and screenings done by your health care provider during your annual well check will depend on your age,  overall health, lifestyle risk factors, and family history of disease. Counseling  Your health care provider may ask you questions about your:  Alcohol use.  Tobacco use.  Drug use.  Emotional well-being.  Home and relationship well-being.  Sexual activity.  Eating habits.  History of falls.  Memory and ability to understand (cognition).  Work and work Statistician. Screening  You may have the following tests or measurements:  Height, weight, and BMI.  Blood pressure.  Lipid and cholesterol levels. These may be checked every 5 years, or more frequently if you are over 42 years old.  Skin check.  Lung cancer screening. You may have this screening every year starting at age 49 if you have a 30-pack-year history of smoking and currently smoke or have quit within the past 15 years.  Fecal occult blood test (FOBT) of the stool. You may have this test every year starting at age 18.  Flexible sigmoidoscopy or colonoscopy. You may have a sigmoidoscopy every 5 years or a colonoscopy every 10 years starting at age 85.  Prostate cancer screening. Recommendations will vary depending on your family history and other risks.  Hepatitis C blood test.  Hepatitis B blood test.  Sexually transmitted disease (STD) testing.  Diabetes screening. This is done by checking your blood sugar (glucose) after you have not eaten for a while (fasting). You may have this done every 1-3 years.  Abdominal aortic aneurysm (AAA) screening. You may need this if you are a current or former smoker.  Osteoporosis. You may be screened starting at age 89 if you are at high risk. Talk with  your health care provider about your test results, treatment options, and if necessary, the need for more tests. Vaccines  Your health care provider may recommend certain vaccines, such as:  Influenza vaccine. This is recommended every year.  Tetanus, diphtheria, and acellular pertussis (Tdap, Td) vaccine. You may  need a Td booster every 10 years.  Zoster vaccine. You may need this after age 54.  Pneumococcal 13-valent conjugate (PCV13) vaccine. One dose is recommended after age 38.  Pneumococcal polysaccharide (PPSV23) vaccine. One dose is recommended after age 30. Talk to your health care provider about which screenings and vaccines you need and how often you need them. This information is not intended to replace advice given to you by your health care provider. Make sure you discuss any questions you have with your health care provider. Document Released: 12/30/2015 Document Revised: 08/22/2016 Document Reviewed: 10/04/2015 Elsevier Interactive Patient Education  2017 [place] Prevention in the Home Falls can cause injuries. They can happen to people of all ages. There are many things you can do to make your home safe and to help prevent falls. What can I do on the outside of my home?  Regularly fix the edges of walkways and driveways and fix any cracks.  Remove anything that might make you trip as you walk through a door, such as a raised step or threshold.  Trim any bushes or trees on the path to your home.  Use bright outdoor lighting.  Clear any walking paths of anything that might make someone trip, such as rocks or tools.  Regularly check to see if handrails are loose or broken. Make sure that both sides of any steps have handrails.  Any raised decks and porches should have guardrails on the edges.  Have any leaves, snow, or ice cleared regularly.  Use sand or salt on walking paths during winter.  Clean up any spills in your garage right away. This includes oil or grease spills. What can I do in the bathroom?  Use night lights.  Install grab bars by the toilet and in the tub and shower. Do not use towel bars as grab bars.  Use non-skid mats or decals in the tub or shower.  If you need to sit down in the shower, use a plastic, non-slip stool.  Keep the floor  dry. Clean up any water that spills on the floor as soon as it happens.  Remove soap buildup in the tub or shower regularly.  Attach bath mats securely with double-sided non-slip rug tape.  Do not have throw rugs and other things on the floor that can make you trip. What can I do in the bedroom?  Use night lights.  Make sure that you have a light by your bed that is easy to reach.  Do not use any sheets or blankets that are too big for your bed. They should not hang down onto the floor.  Have a firm chair that has side arms. You can use this for support while you get dressed.  Do not have throw rugs and other things on the floor that can make you trip. What can I do in the kitchen?  Clean up any spills right away.  Avoid walking on wet floors.  Keep items that you use a lot in easy-to-reach places.  If you need to reach something above you, use a strong step stool that has a grab bar.  Keep electrical cords out of the way.  Do not  use floor polish or wax that makes floors slippery. If you must use wax, use non-skid floor wax.  Do not have throw rugs and other things on the floor that can make you trip. What can I do with my stairs?  Do not leave any items on the stairs.  Make sure that there are handrails on both sides of the stairs and use them. Fix handrails that are broken or loose. Make sure that handrails are as long as the stairways.  Check any carpeting to make sure that it is firmly attached to the stairs. Fix any carpet that is loose or worn.  Avoid having throw rugs at the top or bottom of the stairs. If you do have throw rugs, attach them to the floor with carpet tape.  Make sure that you have a light switch at the top of the stairs and the bottom of the stairs. If you do not have them, ask someone to add them for you. What else can I do to help prevent falls?  Wear shoes that:  Do not have high heels.  Have rubber bottoms.  Are comfortable and fit you  well.  Are closed at the toe. Do not wear sandals.  If you use a stepladder:  Make sure that it is fully opened. Do not climb a closed stepladder.  Make sure that both sides of the stepladder are locked into place.  Ask someone to hold it for you, if possible.  Clearly mark and make sure that you can see:  Any grab bars or handrails.  First and last steps.  Where the edge of each step is.  Use tools that help you move around (mobility aids) if they are needed. These include:  Canes.  Walkers.  Scooters.  Crutches.  Turn on the lights when you go into a dark area. Replace any light bulbs as soon as they burn out.  Set up your furniture so you have a clear path. Avoid moving your furniture around.  If any of your floors are uneven, fix them.  If there are any pets around you, be aware of where they are.  Review your medicines with your doctor. Some medicines can make you feel dizzy. This can increase your chance of falling. Ask your doctor what other things that you can do to help prevent falls. This information is not intended to replace advice given to you by your health care provider. Make sure you discuss any questions you have with your health care provider. Document Released: 09/29/2009 Document Revised: 05/10/2016 Document Reviewed: 01/07/2015 Elsevier Interactive Patient Education  2017 [employer].

## 2018-12-23 NOTE — Progress Notes (Signed)
Subjective:   [name] Greulich. is a 73 y.o. male who presents for Medicare Annual/Subsequent preventive examination.  Review of Systems:  N/A  Cardiac Risk Factors include: advanced age (>78men, >62 women);dyslipidemia;hypertension;male gender     Objective:    Vitals: BP (!) 106/54 (BP Location: Right Arm)   Pulse 64   Temp 97.8 F (36.6 C) (Oral)   Ht 5\' 8"  (1.727 m)   Wt 174 lb 9.6 oz (79.2 kg)   BMI 26.55 kg/m   Body mass index is 26.55 kg/m.  Advanced Directives 12/23/2018 11/06/2018 12/25/2017 09/19/2017 02/11/2017 09/18/2016 04/24/2016  Does Patient Have a Medical Advance Directive? No No No No No No No  Would patient like information on creating a medical advance directive? Yes (MAU/Ambulatory/Procedural Areas - Information given) No - Patient declined No - Patient declined No - Patient declined No - Patient declined - -    Tobacco Social History   Tobacco Use  Smoking Status Never Smoker  Smokeless Tobacco Never Used     Counseling given: Not Answered   Clinical Intake:  Pre-visit preparation completed: Yes  Pain : No/denies pain Pain Score: 0-No pain     Nutritional Status: BMI 25 -29 Overweight Nutritional Risks: None Diabetes: No  How often do you need to have someone help you when you read instructions, pamphlets, or other written materials from your doctor or pharmacy?: 1 - Never  Interpreter Needed?: No  Information entered by :: [employer], LPN  Past Medical History:  Diagnosis Date  . Acute myocardial infarction of other inferior wall, episode of care unspecified May 1994  . Cancer (HCC)    skin / SCC  . Coronary atherosclerosis of native coronary artery   . History of shingles    waist area/right back side  . Occlusion and stenosis of carotid artery without mention of cerebral infarction   . Other and unspecified hyperlipidemia    Severe  . Parkinson's disease   . Peripheral vascular disease, unspecified ([place])   . Precancerous skin  lesion   . Prostate cancer ([place]) 10/2016   low risk  . Shingles August 2015  . Unspecified cerebral artery occlusion with cerebral infarction 2006   CVA  . Unspecified essential hypertension    Past Surgical History:  Procedure Laterality Date  . BIOPSY PROSTATE  11/12/2017  . COLONOSCOPY WITH PROPOFOL N/A 02/11/2017   Procedure: COLONOSCOPY WITH PROPOFOL;  Surgeon: Manya Silvas, MD;  [facility] END[facility]COPY;  Service: Endoscopy;  Laterality: N/A;  . HERNIA REPAIR  02/17/15   right inguinal hernia , laparoscopic placement of [place] 3-D max mesh  . HERNIA REPAIR  3/316   ventral hernia, umbilical, open repair  . SKIN BIOPSY     skin cancer  . TRANSRECTAL ULTRASOUND  11/12/2017   Family History  Problem Relation Age of Onset  . Hypertension Mother   . Heart disease Father   . Hypertension Sister   . Hypertension Brother   . Heart disease Other   . Prostate cancer Neg Hx   . Kidney cancer Neg Hx   . Colon cancer Neg Hx   . Pancreatic cancer Neg Hx   . Breast cancer Neg Hx    Social History   Socioeconomic History  . Marital status: Married    Spouse name: Not on file  . Number of children: 3  . Years of education: Not on file  . Highest education level: Bachelor's degree (e.g., BA, AB, BS)  Occupational History  .  Occupation: GOLF TEACHER    Employer: [employer]    Comment: Part time  Social Needs  . Financial resource strain: Not hard at all  . Food insecurity:    Worry: Never true    Inability: Never true  . Transportation needs:    Medical: No    Non-medical: No  Tobacco Use  . Smoking status: Never Smoker  . Smokeless tobacco: Never Used  Substance and Sexual Activity  . Alcohol use: Yes    Alcohol/week: 21.0 standard drinks    Types: 21 Glasses of wine per week    Comment: 3 glasses of wine/day  . Drug use: No  . Sexual activity: Not Currently  Lifestyle  . Physical activity:    Days per week: 6 days    Minutes per session: 80 min  .  Stress: Not at all  Relationships  . Social connections:    Talks on phone: Patient refused    Gets together: Patient refused    Attends religious service: Patient refused    Active member of club or organization: Patient refused    Attends meetings of clubs or organizations: Patient refused    Relationship status: Patient refused  Other Topics Concern  . Not on file  Social History Narrative   Married   Gets regular exercise    Outpatient Encounter Medications as of 12/23/2018  Medication Sig  . amLODipine (NORVASC) 10 MG tablet Take 1 tablet (10 mg total) by mouth daily.  Marland Kitchen aspirin 162 MG EC tablet Take 162 mg by mouth daily.  Marland Kitchen atorvastatin (LIPITOR) 80 MG tablet TAKE ONE TABLET BY MOUTH EVERY EVENING AT 6PM  . benazepril (LOTENSIN) 40 MG tablet Take 1 tablet (40 mg total) by mouth daily.  . carbidopa-levodopa (SINEMET IR) 25-250 MG tablet Take 1 tablet by mouth 3 (three) times daily.   . fluorouracil (EFUDEX) 5 % cream Apply 1 application topically 2 (two) times daily. X 10 days to forehead and cheeks. Combined with calcipotriene 5%.  . Omega-3 Fatty Acids (OMEGA 3 PO) Take 1,400 mg by mouth daily.  . tamsulosin (FLOMAX) 0.4 MG CAPS capsule Take 1 capsule (0.4 mg total) by mouth daily after supper.  . Vitamin D, Ergocalciferol, (DRISDOL) 1.25 MG (50000 UT) CAPS capsule Take 50,000 Units by mouth every 7 (seven) days.   . [DISCONTINUED] NON FORMULARY   . UNABLE TO FIND Med Name: CBD OIL   No facility-administered encounter medications on file as of 12/23/2018.     Activities of Daily Living In your present state of health, do you have any difficulty performing the following activities: 12/23/2018  Hearing? N  Vision? Y  Comment Has a hard time seeing at night driving. Wears eye glasses.   Difficulty concentrating or making decisions? Y  Walking or climbing stairs? N  Dressing or bathing? Y  Comment Has trouble dressing due to Parkinsons.   Doing errands, shopping? N  Preparing  Food and eating ? N  Using the Toilet? N  In the past six months, have you accidently leaked urine? N  Do you have problems with loss of bowel control? N  Managing your Medications? N  Managing your Finances? N  Housekeeping or managing your Housekeeping? N  Some recent data might be hidden    Patient Care Team: Jerrol Banana., MD as PCP - General (Family Medicine) Byrnett, Forest Gleason, MD (General Surgery) Minna Merritts, MD as Consulting Physician (Cardiology) Vladimir Crofts, MD as Consulting Physician (Neurology)  Alexis Frock, MD as Consulting Physician (Urology) Tyler Pita, MD as Consulting Physician (Radiation Oncology) Oneta Rack, MD (Dermatology)   Assessment:   This is a routine wellness examination for [name] [name].  Exercise Activities and Dietary recommendations Current Exercise Habits: Structured exercise class, Type of exercise: treadmill;walking;strength training/weights, Time (Minutes): > 60, Frequency (Times/Week): 6, Weekly Exercise (Minutes/Week): 0, Intensity: Moderate, Exercise limited by: None identified  Goals    . Increase water intake     Recommend to increase fluid intake to 6-8 glasses per day       Fall Risk Fall Risk  12/23/2018 06/09/2018 09/19/2017 09/18/2016 04/24/2016  Falls in the past year? 0 No No No No  Number falls in past yr: 0 - - - -  Injury with Fall? 0 - - - -  Risk for fall due to : - - Impaired vision - -  Risk for fall due to: Comment - - needs updated eye exam and eye glass Rx - -   FALL RISK PREVENTION PERTAINING TO THE HOME:  Any stairs in or around the home WITH handrails? Yes  Home free of loose throw rugs in walkways, pet beds, electrical cords, etc? Yes  Adequate lighting in your home to reduce risk of falls? Yes   ASSISTIVE DEVICES UTILIZED TO PREVENT FALLS:  Life alert? No  Use of a cane, walker or w/c? No  Grab bars in the bathroom? Yes  Shower chair or bench in shower? No  Elevated toilet seat or a  handicapped toilet? No    TIMED UP AND GO:  Was the test performed? No .    Depression Screen PHQ 2/9 Scores 12/23/2018 06/09/2018 09/19/2017 09/18/2016  PHQ - 2 Score 0 0 0 0    Cognitive Function MMSE - Mini Mental State Exam 09/26/2017  Orientation to time 5  Orientation to Place 5  Registration 3  Attention/ Calculation 5  Recall 3  Language- name 2 objects 2  Language- repeat 1  Language- follow 3 step command 3  Language- read & follow direction 1  Write a sentence 1  Copy design 1  Total score 30     6CIT Screen 12/23/2018 09/18/2016  What Year? 0 points 0 points  What month? 0 points 0 points  What time? 0 points 3 points  Count back from 20 0 points 0 points  Months in reverse 0 points 0 points  Repeat phrase 0 points 0 points  Total Score 0 3    Immunization History  Administered Date(s) Administered  . Influenza, High Dose Seasonal PF 10/07/2018  . Pneumococcal Conjugate-13 04/24/2016  . Pneumococcal Polysaccharide-23 10/07/2018  . Tdap 04/24/2016    Qualifies for Shingles Vaccine? Yes . Due for Shingrix. Education has been provided regarding the importance of this vaccine. Pt has been advised to call insurance company to determine out of pocket expense. Advised may also receive vaccine at local pharmacy or Health Dept. Verbalized acceptance and understanding.  Tdap: Up to date  Flu Vaccine: Up to date  Pneumococcal Vaccine: Up to date   Screening Tests Health Maintenance  Topic Date Due  . Hepatitis C Screening  Nov 05, 1946  . COLONOSCOPY  02/11/2022  . TETANUS/TDAP  04/24/2026  . INFLUENZA VACCINE  Completed  . PNA vac Low Risk Adult  Completed   Cancer Screenings:  Colorectal Screening: Completed 02/11/17. Repeat every 5 years.  Lung Cancer Screening: (Low Dose CT Chest recommended if Age 18-80 years, 30 pack-year currently smoking OR have quit w/in  15years.) does not qualify.    Additional Screening:  Hepatitis C Screening: does qualify;  however declines today.   Vision Screening: Recommended annual ophthalmology exams for early detection of glaucoma and other disorders of the eye.  Dental Screening: Recommended annual dental exams for proper oral hygiene  Community Resource Referral:  CRR required this visit?  No        Plan:  I have personally reviewed and addressed the Medicare Annual Wellness questionnaire and have noted the following in the patient's chart:  A. Medical and social history B. Use of alcohol, tobacco or illicit drugs  C. Current medications and supplements D. Functional ability and status E.  Nutritional status F.  Physical activity G. Advance directives H. List of other physicians I.  Hospitalizations, surgeries, and ER visits in previous 12 months J.  [place] such as hearing and vision if needed, cognitive and depression L. Referrals and appointments - none  In addition, I have reviewed and discussed with patient certain preventive protocols, quality metrics, and best practice recommendations. A written personalized care plan for preventive services as well as general preventive health recommendations were provided to patient.  See attached scanned questionnaire for additional information.   Signed,  Fabio Neighbors, LPN Nurse Health Advisor   Nurse Recommendations: Pt declined the Hep C lab today and would like this added onto his BW he has ordered at next [facility].

## 2019-02-17 ENCOUNTER — Ambulatory Visit: Payer: Medicare HMO | Admitting: Urology

## 2019-04-09 ENCOUNTER — Ambulatory Visit (INDEPENDENT_AMBULATORY_CARE_PROVIDER_SITE_OTHER): Payer: Medicare HMO | Admitting: Family Medicine

## 2019-04-09 ENCOUNTER — Encounter: Payer: Self-pay | Admitting: Family Medicine

## 2019-04-09 ENCOUNTER — Other Ambulatory Visit: Payer: Self-pay

## 2019-04-09 VITALS — BP 142/76 | HR 65 | Temp 98.4°F | Ht 68.0 in | Wt 174.0 lb

## 2019-04-09 DIAGNOSIS — C61 Malignant neoplasm of prostate: Secondary | ICD-10-CM | POA: Diagnosis not present

## 2019-04-09 DIAGNOSIS — I10 Essential (primary) hypertension: Secondary | ICD-10-CM | POA: Diagnosis not present

## 2019-04-09 DIAGNOSIS — K429 Umbilical hernia without obstruction or gangrene: Secondary | ICD-10-CM

## 2019-04-09 DIAGNOSIS — E782 Mixed hyperlipidemia: Secondary | ICD-10-CM

## 2019-04-09 DIAGNOSIS — I25118 Atherosclerotic heart disease of native coronary artery with other forms of angina pectoris: Secondary | ICD-10-CM

## 2019-04-09 DIAGNOSIS — G20 Parkinson's disease: Secondary | ICD-10-CM

## 2019-04-09 NOTE — Progress Notes (Signed)
Patient: [name] [name]. Male    DOB: 1946/03/17   73 y.o.   MRN: [id number] Visit Date: 04/09/2019  Today's Provider: Wilhemena Durie, MD   Chief Complaint  Patient presents with  . Follow-up    6 month fup   Subjective:     HPI  Pt reports he is here for a 6 month fup on parkinson's and prostate cancer.  Pt reports he has went through radiation treatments in Aug 2019.  Pt reports that he feels he is doing better. He and his wife are doing well through the coronavirus pandemic despite his Parkinson's disease.  Overall he is feeling well.  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  He helps her with this.  Allergies  Allergen Reactions  . Donepezil Nausea Only    Sick feeling  . Penicillins      Current Outpatient Medications:  .  amLODipine (NORVASC) 10 MG tablet, Take 1 tablet (10 mg total) by mouth daily., Disp: 90 tablet, Rfl: 3 .  aspirin 162 MG EC tablet, Take 162 mg by mouth daily., Disp: , Rfl:  .  atorvastatin (LIPITOR) 80 MG tablet, TAKE ONE TABLET BY MOUTH EVERY EVENING AT 6PM, Disp: 90 tablet, Rfl: 3 .  benazepril (LOTENSIN) 40 MG tablet, Take 1 tablet (40 mg total) by mouth daily., Disp: 90 tablet, Rfl: 3 .  carbidopa-levodopa (SINEMET IR) 25-250 MG tablet, Take 1 tablet by mouth 3 (three) times daily. , Disp: , Rfl:  .  Omega-3 Fatty Acids (OMEGA 3 PO), Take 1,400 mg by mouth daily., Disp: , Rfl:  .  fluorouracil (EFUDEX) 5 % cream, Apply 1 application topically 2 (two) times daily. X 10 days to forehead and cheeks. Combined with calcipotriene 5%., Disp: , Rfl: 0 .  tamsulosin (FLOMAX) 0.4 MG CAPS capsule, Take 1 capsule (0.4 mg total) by mouth daily after supper. (Patient not taking: Reported on 04/09/2019), Disp: 30 capsule, Rfl: 5 .  UNABLE TO FIND, Med Name: CBD OIL, Disp: , Rfl:  .  Vitamin D, Ergocalciferol, (DRISDOL) 1.25 MG (50000 UT) CAPS capsule, Take 50,000 Units by mouth every 7  (seven) days. , Disp: , Rfl:   Review of Systems  Constitutional: Negative.   HENT: Negative.   Eyes: Negative.   Respiratory: Negative.   Cardiovascular: Negative.   Gastrointestinal: Negative.   Endocrine: Negative.   Musculoskeletal: Negative.   Skin:       SKs.  Allergic/Immunologic: Negative.   Neurological: Positive for tremors.  Hematological: Negative.   Psychiatric/Behavioral: Negative.   All other systems reviewed and are negative.   Social History   Tobacco Use  . Smoking status: Never Smoker  . Smokeless tobacco: Never Used  Substance Use Topics  . Alcohol use: Yes    Alcohol/week: 21.0 standard drinks    Types: 21 Glasses of wine per week    Comment: 3 glasses of wine/day      Objective:   BP (!) 142/76 (BP Location: Right Arm, Patient Position: Sitting, Cuff Size: Normal)   Pulse 65   Temp 98.4 F (36.9 C) (Oral)   Ht 5\' 8"  (1.727 m)   Wt 174 lb (78.9 kg)   SpO2 99%   BMI 26.46 kg/m  Vitals:   04/09/19 1007  BP: (!) 142/76  Pulse: 65  Temp: 98.4 F (36.9 C)  TempSrc: Oral  SpO2: 99%  Weight: 174 lb (  78.9 kg)  Height: 5\' 8"  (1.727 m)     Physical Exam Constitutional:      Appearance: He is well-developed.  HENT:     Head: Normocephalic and atraumatic.     Right Ear: External ear normal.     Left Ear: External ear normal.     Nose: Nose normal.  Eyes:     General: No scleral icterus.    Conjunctiva/sclera: Conjunctivae normal.     Pupils: Pupils are equal, round, and reactive to light.  Neck:     Thyroid: No thyromegaly.  Cardiovascular:     Rate and Rhythm: Normal rate and regular rhythm.     Heart sounds: Murmur present.     Comments: 2/6 systolic murmur at right upper sternal border. Pulmonary:     Effort: Pulmonary effort is normal.     Breath sounds: Normal breath sounds.  Abdominal:     Palpations: Abdomen is soft.     Comments: Small easily reducible umbilical hernia.  Lymphadenopathy:     Cervical: No cervical  adenopathy.  Skin:    General: Skin is warm and dry.     Comments: SKs of face.  Neurological:     Mental Status: He is alert and oriented to person, place, and time. Mental status is at baseline.     Comments: Mild Parkinson stigmata  Psychiatric:        Mood and Affect: Mood normal.        Behavior: Behavior normal.        Thought Content: Thought content normal.        Judgment: Judgment normal.         Assessment & Plan    1. PARKINSON'S DISEASE Stable, followed by neurology. - CBC with Differential/Platelet - Comprehensive metabolic panel - TSH - Lipid panel - PSA  2. Prostate cancer [facility]) Followed by urology. - PSA  3. Essential hypertension Controlled on benazepril - CBC with Differential/Platelet - Comprehensive metabolic panel - TSH - Lipid panel - PSA  4. Mixed hyperlipidemia Trolled on Lipitor 80 - CBC with Differential/Platelet - Comprehensive metabolic panel - TSH - Lipid panel  5. Umbilical hernia without obstruction and without gangrene   6. Atherosclerosis of native coronary artery of native heart with stable angina pectoris ([place]) All risk factors treated 7.  B12 deficiency/low vitamin D    Wilhemena Durie, MD  [facility]

## 2019-05-14 DIAGNOSIS — D2272 Melanocytic nevi of left lower limb, including hip: Secondary | ICD-10-CM | POA: Diagnosis not present

## 2019-05-14 DIAGNOSIS — Z85820 Personal history of malignant melanoma of skin: Secondary | ICD-10-CM | POA: Diagnosis not present

## 2019-05-14 DIAGNOSIS — D2271 Melanocytic nevi of right lower limb, including hip: Secondary | ICD-10-CM | POA: Diagnosis not present

## 2019-05-14 DIAGNOSIS — Z08 Encounter for follow-up examination after completed treatment for malignant neoplasm: Secondary | ICD-10-CM | POA: Diagnosis not present

## 2019-05-14 DIAGNOSIS — D225 Melanocytic nevi of trunk: Secondary | ICD-10-CM | POA: Diagnosis not present

## 2019-05-14 DIAGNOSIS — D2261 Melanocytic nevi of right upper limb, including shoulder: Secondary | ICD-10-CM | POA: Diagnosis not present

## 2019-05-14 DIAGNOSIS — L820 Inflamed seborrheic keratosis: Secondary | ICD-10-CM | POA: Diagnosis not present

## 2019-05-14 DIAGNOSIS — C61 Malignant neoplasm of prostate: Secondary | ICD-10-CM | POA: Diagnosis not present

## 2019-05-14 DIAGNOSIS — L538 Other specified erythematous conditions: Secondary | ICD-10-CM | POA: Diagnosis not present

## 2019-05-14 DIAGNOSIS — D2262 Melanocytic nevi of left upper limb, including shoulder: Secondary | ICD-10-CM | POA: Diagnosis not present

## 2019-05-14 DIAGNOSIS — Z85828 Personal history of other malignant neoplasm of skin: Secondary | ICD-10-CM | POA: Diagnosis not present

## 2019-05-20 DIAGNOSIS — Z7982 Long term (current) use of aspirin: Secondary | ICD-10-CM | POA: Diagnosis not present

## 2019-05-20 DIAGNOSIS — Z8249 Family history of ischemic heart disease and other diseases of the circulatory system: Secondary | ICD-10-CM | POA: Diagnosis not present

## 2019-05-20 DIAGNOSIS — G20 Parkinson's disease: Secondary | ICD-10-CM | POA: Diagnosis not present

## 2019-05-20 DIAGNOSIS — I252 Old myocardial infarction: Secondary | ICD-10-CM | POA: Diagnosis not present

## 2019-05-20 DIAGNOSIS — I10 Essential (primary) hypertension: Secondary | ICD-10-CM | POA: Diagnosis not present

## 2019-05-20 DIAGNOSIS — Z85828 Personal history of other malignant neoplasm of skin: Secondary | ICD-10-CM | POA: Diagnosis not present

## 2019-05-20 DIAGNOSIS — Z880 Allergy status to penicillin: Secondary | ICD-10-CM | POA: Diagnosis not present

## 2019-05-20 DIAGNOSIS — Z8546 Personal history of malignant neoplasm of prostate: Secondary | ICD-10-CM | POA: Diagnosis not present

## 2019-05-21 DIAGNOSIS — C61 Malignant neoplasm of prostate: Secondary | ICD-10-CM | POA: Diagnosis not present

## 2019-06-08 ENCOUNTER — Other Ambulatory Visit: Payer: Self-pay | Admitting: *Deleted

## 2019-06-08 MED ORDER — BENAZEPRIL HCL 40 MG PO TABS: 40.0000 mg | ORAL_TABLET | Freq: Every day | ORAL | 3 refills | Status: DC

## 2019-06-09 MED ORDER — AMLODIPINE BESYLATE 10 MG PO TABS: 10.0000 mg | ORAL_TABLET | Freq: Every day | ORAL | 0 refills | Status: DC

## 2019-06-10 ENCOUNTER — Other Ambulatory Visit: Payer: Self-pay | Admitting: *Deleted

## 2019-06-10 MED ORDER — AMLODIPINE BESYLATE 10 MG PO TABS: 10.0000 mg | ORAL_TABLET | Freq: Every day | ORAL | 0 refills | Status: DC

## 2019-06-10 MED ORDER — BENAZEPRIL HCL 40 MG PO TABS: 40.0000 mg | ORAL_TABLET | Freq: Every day | ORAL | 0 refills | Status: DC

## 2019-06-30 DIAGNOSIS — R4189 Other symptoms and signs involving cognitive functions and awareness: Secondary | ICD-10-CM | POA: Diagnosis not present

## 2019-06-30 DIAGNOSIS — G20 Parkinson's disease: Secondary | ICD-10-CM | POA: Diagnosis not present

## 2019-07-22 DIAGNOSIS — R69 Illness, unspecified: Secondary | ICD-10-CM | POA: Diagnosis not present

## 2019-07-25 NOTE — Progress Notes (Signed)
Cardiology Office Note  Date:  07/27/2019   ID:  [name] Liming., DOB July 21, 1946, MRN [id number]  PCP:  Jerrol Banana., MD   Chief Complaint  Patient presents with  . Other    12 month follow up. Patient denies chest pain and SOB at this time. Meds reviewed verbally with patient.     HPI:  Mr. Montalto is a pleasant 73 year old gentleman with a history of  bilateral moderate carotid disease,  prior MI in 1994 with disease in the distal RCA with medical management  stress test in January 2012 with fixed inferior wall defect,   history of Parkinson's Presents for routine followup of his coronary artery disease and PAD   treadmill 5 to 6 days a week Walks 55 min a day No anginal symptoms  PSA previously elevated 9.9, diagnosed with prostate cancer completed XRT, PSA down  Sees Dr. Shah,neurology parkinsons stable Balance stable Tremor from his Parkinson's ,  stable  Would like to go back to the gym and do weights doing less exercise  Overall no complaints.  Denies any chest pain or shortness of breath with exertion.  EKG personally reviewed by myself on todays visit  shows normal sinus rhythm with rate 65 bpm,  Old inferior MI   PMH:   has a past medical history of Acute myocardial infarction of other inferior wall, episode of care unspecified (May 1994), Cancer [facility]), Coronary atherosclerosis of native coronary artery, History of shingles, Occlusion and stenosis of carotid artery without mention of cerebral infarction, Other and unspecified hyperlipidemia, Parkinson's disease, Peripheral vascular disease, unspecified ([place]), Precancerous skin lesion, Prostate cancer ([place]) (10/2016), Shingles (August 2015), Unspecified cerebral artery occlusion with cerebral infarction (2006), and Unspecified essential hypertension.  PSH:    Past Surgical History:  Procedure Laterality Date  . BIOPSY PROSTATE  11/12/2017  . COLONOSCOPY WITH PROPOFOL N/A 02/11/2017   Procedure:  COLONOSCOPY WITH PROPOFOL;  Surgeon: Manya Silvas, MD;  Location: [facility] ENDOSCOPY;  Service: Endoscopy;  Laterality: N/A;  . HERNIA REPAIR  02/17/15   right inguinal hernia , laparoscopic placement of Bard 3-D max mesh  . HERNIA REPAIR  3/316   ventral hernia, umbilical, open repair  . SKIN BIOPSY     skin cancer  . TRANSRECTAL ULTRASOUND  11/12/2017    Current Outpatient Medications  Medication Sig Dispense Refill  . Amantadine HCl 100 MG tablet Take 50 mg by mouth 2 (two) times daily.    Marland Kitchen amLODipine (NORVASC) 10 MG tablet Take 1 tablet (10 mg total) by mouth daily. 90 tablet 0  . aspirin 81 MG EC tablet Take 81 mg by mouth daily.     Marland Kitchen atorvastatin (LIPITOR) 80 MG tablet TAKE ONE TABLET BY MOUTH EVERY EVENING AT 6PM 90 tablet 3  . benazepril (LOTENSIN) 40 MG tablet Take 1 tablet (40 mg total) by mouth daily. 90 tablet 0  . carbidopa-levodopa (SINEMET IR) 25-250 MG tablet Take 1 tablet by mouth 3 (three) times daily.     . fluorouracil (EFUDEX) 5 % cream Apply 1 application topically 2 (two) times daily. X 10 days to forehead and cheeks. Combined with calcipotriene 5%.  0  . Omega-3 Fatty Acids (OMEGA 3 PO) Take 1,400 mg by mouth daily.    . tamsulosin (FLOMAX) 0.4 MG CAPS capsule Take 1 capsule (0.4 mg total) by mouth daily after supper. 30 capsule 5  . UNABLE TO FIND Med Name: CBD OIL    . Vitamin D, Ergocalciferol, (DRISDOL) 1.25  MG (50000 UT) CAPS capsule Take 50,000 Units by mouth every 7 (seven) days.      No current facility-administered medications for this visit.      Allergies:   Donepezil and Penicillins   Social History:  The patient  reports that he has never smoked. He has never used smokeless tobacco. He reports current alcohol use of about 21.0 standard drinks of alcohol per week. He reports that he does not use drugs.   Family History:   family history includes Heart disease in his father and another family member; Hypertension in his brother, mother, and sister.     Review of Systems: Review of Systems  Constitutional: Negative.   Respiratory: Negative.   Cardiovascular: Negative.   Gastrointestinal: Negative.   Musculoskeletal: Negative.   Neurological: Positive for tremors.  Psychiatric/Behavioral: Positive for memory loss.  All other systems reviewed and are negative.   PHYSICAL EXAM: VS:  BP 114/66 (BP Location: Left Arm, Patient Position: Sitting, Cuff Size: Normal)   Pulse 65   Ht 5\' 8"  (1.727 m)   Wt 172 lb (78 kg)   BMI 26.15 kg/m  , BMI Body mass index is 26.15 kg/m. Constitutional:  oriented to person, place, and time. No distress. tremor HENT:  Head: Grossly normal Eyes:  no discharge. No scleral icterus.  Neck: No JVD, no carotid bruits  Cardiovascular: Regular rate and rhythm, no murmurs appreciated Pulmonary/Chest: Clear to auscultation bilaterally, no wheezes or rails Abdominal: Soft.  no distension.  no tenderness.  Musculoskeletal: Normal range of motion Neurological:  normal muscle tone. Coordination normal. No atrophy Skin: Skin warm and dry Psychiatric: normal affect, pleasant  Recent Labs: No results found for requested labs within last 8760 hours.    Lipid Panel Lab Results  Component Value Date   CHOL 159 04/11/2018   HDL 63 04/11/2018   LDLCALC 87 04/11/2018   TRIG 43 04/11/2018      Wt Readings from Last 3 Encounters:  07/27/19 172 lb (78 kg)  04/09/19 174 lb (78.9 kg)  12/23/18 174 lb 9.6 oz (79.2 kg)       ASSESSMENT AND PLAN:  Atherosclerosis of native coronary artery of native heart with stable angina pectoris (HCC) No further workup at this time. Continue current medication regimen.stable, regular exercise with no anginal symptoms  Essential hypertension Blood pressure is well controlled on today's visit. No changes made to the medications.  Mixed hyperlipidemia No new labs He will repeat through PMD  Bilateral carotid artery stenosis  carotid ultrasound 10/2018 Stable  disease   PARKINSON'S DISEASE memory loss, tremor, balance , stable Followed by neurology wants access to weight room at gym  Aortic valve stenosis Echo ordered  Disposition:   F/U  12 months   Total encounter time more than 25 minutes  Greater than 50% was spent in counseling and coordination of care with the patient    Orders Placed This Encounter  Procedures  . EKG 12-Lead  . ECHOCARDIOGRAM COMPLETE     Signed, Esmond Plants, M.D., Ph.D. 07/27/2019  [place], [place]

## 2019-07-27 ENCOUNTER — Ambulatory Visit (INDEPENDENT_AMBULATORY_CARE_PROVIDER_SITE_OTHER): Payer: Medicare HMO | Admitting: Cardiovascular Disease

## 2019-07-27 ENCOUNTER — Encounter: Payer: Self-pay | Admitting: Cardiovascular Disease

## 2019-07-27 ENCOUNTER — Telehealth: Payer: Self-pay | Admitting: Cardiovascular Disease

## 2019-07-27 ENCOUNTER — Other Ambulatory Visit: Payer: Self-pay

## 2019-07-27 VITALS — BP 114/66 | HR 65 | Ht 68.0 in | Wt 172.0 lb

## 2019-07-27 DIAGNOSIS — I10 Essential (primary) hypertension: Secondary | ICD-10-CM | POA: Diagnosis not present

## 2019-07-27 DIAGNOSIS — I739 Peripheral vascular disease, unspecified: Secondary | ICD-10-CM | POA: Diagnosis not present

## 2019-07-27 DIAGNOSIS — I25118 Atherosclerotic heart disease of native coronary artery with other forms of angina pectoris: Secondary | ICD-10-CM | POA: Diagnosis not present

## 2019-07-27 DIAGNOSIS — I6529 Occlusion and stenosis of unspecified carotid artery: Secondary | ICD-10-CM

## 2019-07-27 DIAGNOSIS — E782 Mixed hyperlipidemia: Secondary | ICD-10-CM | POA: Diagnosis not present

## 2019-07-27 DIAGNOSIS — I350 Nonrheumatic aortic (valve) stenosis: Secondary | ICD-10-CM | POA: Diagnosis not present

## 2019-07-27 NOTE — Patient Instructions (Addendum)
We will order an echocardiogram for aortic valve stenosis  Medication Instructions:  No changes  If you need a refill on your cardiac medications before your next appointment, please call your pharmacy.    Lab work: No new labs needed   If you have labs (blood work) drawn today and your tests are completely normal, you will receive your results only by: Marland Kitchen MyChart Message (if you have MyChart) OR . A paper copy in the mail If you have any lab test that is abnormal or we need to change your treatment, we will call you to review the results.   Testing/Procedures: No new testing needed   Follow-Up: At [facility], you and your health needs are our priority.  As part of our continuing mission to provide you with exceptional heart care, we have created designated Provider Care Teams.  These Care Teams include your primary Cardiologist (physician) and Advanced Practice Providers (APPs -  Physician Assistants and Nurse Practitioners) who all work together to provide you with the care you need, when you need it.  . You will need a follow up appointment in 12 months .   Please call our office 2 months in advance to schedule this appointment.    . Providers on your designated Care Team:   . Murray Hodgkins, NP . Christell Faith, PA-C . Marrianne Mood, PA-C  Any Other Special Instructions Will Be Listed Below (If Applicable).  For educational health videos Log in to : [email or web address] Or : [email or web address], password : [email or web address]

## 2019-07-27 NOTE — Telephone Encounter (Signed)
Patient spoke with Dr. Rockey Situ today about getting a note/ rx sent to [facility] for be able to use gym / facility.    Please send to Director at :   [email or web address]

## 2019-07-28 DIAGNOSIS — G20 Parkinson's disease: Secondary | ICD-10-CM | POA: Diagnosis not present

## 2019-07-28 DIAGNOSIS — I10 Essential (primary) hypertension: Secondary | ICD-10-CM | POA: Diagnosis not present

## 2019-07-28 DIAGNOSIS — C61 Malignant neoplasm of prostate: Secondary | ICD-10-CM | POA: Diagnosis not present

## 2019-07-28 DIAGNOSIS — E782 Mixed hyperlipidemia: Secondary | ICD-10-CM | POA: Diagnosis not present

## 2019-07-28 NOTE — Telephone Encounter (Signed)
Spoke with patient and let him know that we do not have a way to email from our office but that I would happy to fax or mail if that would work. He states that Dr. Rockey Situ wanted to make sure that the [facility] would allow this and that either him or Dr. Rosanna Randy could write letter allowing him to return for weights. Let him know that provider will be working hospital the rest of this week and he stated that he would check with Dr. Rosanna Randy to see if they could write him a letter to return to [facility]. Instructed him to please call me back next Monday if he is not able to get letter from PCP office and I would be happy to help get that done if still needed. He verbalized understanding of our conversation, agreement with plan, and had no further questions at this time.

## 2019-07-29 ENCOUNTER — Telehealth: Payer: Self-pay

## 2019-07-29 LAB — COMPREHENSIVE METABOLIC PANEL
ALT: 7 IU/L (ref 0–44)
AST: 18 IU/L (ref 0–40)
Albumin/Globulin Ratio: 1.9 (ref 1.2–2.2)
Albumin: 4.3 g/dL (ref 3.7–4.7)
Alkaline Phosphatase: 51 IU/L (ref 39–117)
BUN/Creatinine Ratio: 20 (ref 10–24)
BUN: 11 mg/dL (ref 8–27)
Bilirubin Total: 0.9 mg/dL (ref 0.0–1.2)
CO2: 23 mmol/L (ref 20–29)
Calcium: 9.2 mg/dL (ref 8.6–10.2)
Chloride: 101 mmol/L (ref 96–106)
Creatinine, Ser: 0.56 mg/dL — ABNORMAL LOW (ref 0.76–1.27)
GFR calc Af Amer: 119 mL/min/{1.73_m2} (ref >59)
GFR calc non Af Amer: 103 mL/min/{1.73_m2} (ref >59)
Globulin, Total: 2.3 g/dL (ref 1.5–4.5)
Glucose: 101 mg/dL — ABNORMAL HIGH (ref 65–99)
Potassium: 4.3 mmol/L (ref 3.5–5.2)
Sodium: 139 mmol/L (ref 134–144)
Total Protein: 6.6 g/dL (ref 6.0–8.5)

## 2019-07-29 LAB — CBC WITH DIFFERENTIAL/PLATELET
Basophils Absolute: 0 10*3/uL (ref 0.0–0.2)
Basos: 1 %
EOS (ABSOLUTE): 0 10*3/uL (ref 0.0–0.4)
Eos: 1 %
Hematocrit: 40.9 % (ref 37.5–51.0)
Hemoglobin: 14 g/dL (ref 13.0–17.7)
Immature Grans (Abs): 0 10*3/uL (ref 0.0–0.1)
Immature Granulocytes: 0 %
Lymphocytes Absolute: 1.1 10*3/uL (ref 0.7–3.1)
Lymphs: 28 %
MCH: 32.1 pg (ref 26.6–33.0)
MCHC: 34.2 g/dL (ref 31.5–35.7)
MCV: 94 fL (ref 79–97)
Monocytes Absolute: 0.4 10*3/uL (ref 0.1–0.9)
Monocytes: 10 %
Neutrophils Absolute: 2.5 10*3/uL (ref 1.4–7.0)
Neutrophils: 60 %
Platelets: 193 10*3/uL (ref 150–450)
RBC: 4.36 x10E6/uL (ref 4.14–5.80)
RDW: 12 % (ref 11.6–15.4)
WBC: 4.1 10*3/uL (ref 3.4–10.8)

## 2019-07-29 LAB — TSH: TSH: 0.708 u[IU]/mL (ref 0.450–4.500)

## 2019-07-29 LAB — LIPID PANEL
Chol/HDL Ratio: 2.7 ratio (ref 0.0–5.0)
Cholesterol, Total: 170 mg/dL (ref 100–199)
HDL: 62 mg/dL (ref >39)
LDL Calculated: 95 mg/dL (ref 0–99)
Triglycerides: 65 mg/dL (ref 0–149)
VLDL Cholesterol Cal: 13 mg/dL (ref 5–40)

## 2019-07-29 LAB — PSA: Prostate Specific Ag, Serum: 0.8 ng/mL (ref 0.0–4.0)

## 2019-07-29 NOTE — Telephone Encounter (Signed)
-----   Message from Jerrol Banana., MD sent at 07/29/2019  8:07 AM EDT ----- Labs ok.

## 2019-07-29 NOTE — Telephone Encounter (Signed)
Patient requesting a letter stating that patient would benefit from exercising at the [facility]. Patient reports he has called cardiology and they recommended he talk to PCP first. Patient requesting letter to be sent to [email or web address]. CB# [phone]

## 2019-07-29 NOTE — Telephone Encounter (Signed)
Patient advised as below.  

## 2019-07-30 ENCOUNTER — Telehealth: Payer: Self-pay

## 2019-07-30 NOTE — Telephone Encounter (Signed)
Note written. Patient advised.  

## 2019-07-30 NOTE — Telephone Encounter (Signed)
Patient was calling back to find out the status of the letter he requested.

## 2019-07-30 NOTE — Telephone Encounter (Signed)
Yes this seems to be a new thing-- I am ok with standard letter for this--thank you

## 2019-07-30 NOTE — Telephone Encounter (Signed)
See previous message

## 2019-07-30 NOTE — Telephone Encounter (Signed)
Dr. Rosanna Randy, please review. Patient calling again. Ok to write letter?

## 2019-08-13 ENCOUNTER — Other Ambulatory Visit: Payer: Self-pay

## 2019-08-13 ENCOUNTER — Ambulatory Visit (INDEPENDENT_AMBULATORY_CARE_PROVIDER_SITE_OTHER): Payer: Medicare HMO

## 2019-08-13 DIAGNOSIS — I350 Nonrheumatic aortic (valve) stenosis: Secondary | ICD-10-CM | POA: Diagnosis not present

## 2019-08-25 ENCOUNTER — Telehealth: Payer: Self-pay

## 2019-08-25 NOTE — Telephone Encounter (Signed)
Patient made aware of echo results with verbal understanding.

## 2019-08-25 NOTE — Telephone Encounter (Signed)
-----   Message from Minna Merritts, MD sent at 08/24/2019  3:29 PM EDT ----- Echocardiogram with normal cardiac function Aortic valve is heavily calcified with mild stenosis This can be monitored with periodic echocardiogram

## 2019-09-07 ENCOUNTER — Telehealth: Payer: Self-pay

## 2019-09-07 MED ORDER — ATORVASTATIN CALCIUM 80 MG PO TABS: ORAL_TABLET | ORAL | 2 refills | Status: DC

## 2019-09-07 NOTE — Telephone Encounter (Signed)
Requested Prescriptions   Signed Prescriptions Disp Refills  . atorvastatin (LIPITOR) 80 MG tablet 90 tablet 2    Sig: TAKE ONE TABLET BY MOUTH EVERY EVENING AT 6PM    Authorizing Provider: Minna Merritts    Ordering User: Raelene Bott, Krishay Faro L

## 2019-10-05 NOTE — Progress Notes (Signed)
Patient: [name] Stancil., Male    DOB: 1946-01-01, 73 y.o.   MRN: [id number] Visit Date: 10/06/2019  Today's Provider: Wilhemena Durie, MD   Chief Complaint  Patient presents with  . Annual Exam   Subjective:     Patient had AWV with [facility] 12/23/2018.   Annual physical exam [name] [name]. is a 73 y.o. male who presents today for health maintenance and complete physical. He feels fairly well. He reports exercising yes. He reports he is sleeping fairly well. His Parkinson's disease is slowly progressive and he finds himself forgetting words a lot.  This seems to be getting worse. -----------------------------------------------------------   Review of Systems  Constitutional: Negative.   HENT: Negative.   Eyes: Negative.   Respiratory: Negative.   Cardiovascular: Negative.   Gastrointestinal: Positive for constipation.  Endocrine: Negative.   Musculoskeletal: Negative.   Allergic/Immunologic: Negative.   Neurological: Positive for tremors and weakness.  Hematological: Negative.   Psychiatric/Behavioral: Negative.     Social History      He  reports that he has never smoked. He has never used smokeless tobacco. He reports current alcohol use of about 21.0 standard drinks of alcohol per week. He reports that he does not use drugs.       Social History   Socioeconomic History  . Marital status: Married    Spouse name: Not on file  . Number of children: 3  . Years of education: Not on file  . Highest education level: Bachelor's degree (e.g., BA, AB, BS)  Occupational History  . Occupation: GOLF TEACHER    Employer: [employer]    Comment: Part time  Social Needs  . Financial resource strain: Not hard at all  . Food insecurity    Worry: Never true    Inability: Never true  . Transportation needs    Medical: No    Non-medical: No  Tobacco Use  . Smoking status: Never Smoker  . Smokeless tobacco: Never Used  Substance and Sexual Activity  .  Alcohol use: Yes    Alcohol/week: 21.0 standard drinks    Types: 21 Glasses of wine per week    Comment: 3 glasses of wine/day  . Drug use: No  . Sexual activity: Not Currently  Lifestyle  . Physical activity    Days per week: 6 days    Minutes per session: 80 min  . Stress: Not at all  Relationships  . Social Herbalist on phone: Patient refused    Gets together: Patient refused    Attends religious service: Patient refused    Active member of club or organization: Patient refused    Attends meetings of clubs or organizations: Patient refused    Relationship status: Patient refused  Other Topics Concern  . Not on file  Social History Narrative   Married   Gets regular exercise    Past Medical History:  Diagnosis Date  . Acute myocardial infarction of other inferior wall, episode of care unspecified May 1994  . Cancer (HCC)    skin / SCC  . Coronary atherosclerosis of native coronary artery   . History of shingles    waist area/right back side  . Occlusion and stenosis of carotid artery without mention of cerebral infarction   . Other and unspecified hyperlipidemia    Severe  . Parkinson's disease   . Peripheral vascular disease, unspecified ([place])   . Precancerous skin lesion   .  Prostate cancer ([place]) 10/2016   low risk  . Shingles August 2015  . Unspecified cerebral artery occlusion with cerebral infarction 2006   CVA  . Unspecified essential hypertension      Patient Active Problem List   Diagnosis Date Noted  . Malignant neoplasm of prostate ([place]) 06/09/2018  . MCI (mild cognitive impairment) 03/27/2017  . Herpes zona 04/19/2016  . Umbilical hernia without obstruction and without gangrene 10/15/2014  . Hernia of abdominal cavity 10/15/2014  . PVD 10/02/2010  . WEIGHT GAIN 02/21/2010  . Hyperlipidemia 02/16/2010  . Essential hypertension 02/16/2010  . Old myocardial infarction 02/16/2010  . CAD, NATIVE VESSEL 02/16/2010  . Carotid stenosis  06/14/2009  . Carotid artery obstruction 06/14/2009  . PARKINSON'S DISEASE 05/12/2009    Past Surgical History:  Procedure Laterality Date  . BIOPSY PROSTATE  11/12/2017  . COLONOSCOPY WITH PROPOFOL N/A 02/11/2017   Procedure: COLONOSCOPY WITH PROPOFOL;  Surgeon: Manya Silvas, MD;  Location: [facility] ENDOSCOPY;  Service: Endoscopy;  Laterality: N/A;  . HERNIA REPAIR  02/17/15   right inguinal hernia , laparoscopic placement of Bard 3-D max mesh  . HERNIA REPAIR  3/316   ventral hernia, umbilical, open repair  . SKIN BIOPSY     skin cancer  . TRANSRECTAL ULTRASOUND  11/12/2017    Family History        Family Status  Relation Name Status  . Mother  Deceased  . Father  Deceased  . Sister  Alive  . Brother  Alive  . Other  (Not Specified)  . Neg Hx  (Not Specified)        His family history includes Heart disease in his father and another family member; Hypertension in his brother, mother, and sister. There is no history of Prostate cancer, Kidney cancer, Colon cancer, Pancreatic cancer, or Breast cancer.      Allergies  Allergen Reactions  . Donepezil Nausea Only    Sick feeling  . Penicillins      Current Outpatient Medications:  .  Amantadine HCl 100 MG tablet, Take 50 mg by mouth 2 (two) times daily., Disp: , Rfl:  .  amLODipine (NORVASC) 10 MG tablet, Take 1 tablet (10 mg total) by mouth daily., Disp: 90 tablet, Rfl: 0 .  aspirin 81 MG EC tablet, Take 81 mg by mouth daily. , Disp: , Rfl:  .  atorvastatin (LIPITOR) 80 MG tablet, TAKE ONE TABLET BY MOUTH EVERY EVENING AT 6PM, Disp: 90 tablet, Rfl: 2 .  benazepril (LOTENSIN) 40 MG tablet, Take 1 tablet (40 mg total) by mouth daily., Disp: 90 tablet, Rfl: 0 .  carbidopa-levodopa (SINEMET IR) 25-250 MG tablet, Take 1 tablet by mouth 3 (three) times daily. , Disp: , Rfl:  .  Omega-3 Fatty Acids (OMEGA 3 PO), Take 1,400 mg by mouth daily., Disp: , Rfl:  .  tamsulosin (FLOMAX) 0.4 MG CAPS capsule, Take 1 capsule (0.4 mg total)  by mouth daily after supper., Disp: 30 capsule, Rfl: 5 .  fluorouracil (EFUDEX) 5 % cream, Apply 1 application topically 2 (two) times daily. X 10 days to forehead and cheeks. Combined with calcipotriene 5%., Disp: , Rfl: 0 .  UNABLE TO FIND, Med Name: CBD OIL, Disp: , Rfl:  .  Vitamin D, Ergocalciferol, (DRISDOL) 1.25 MG (50000 UT) CAPS capsule, Take 50,000 Units by mouth every 7 (seven) days. , Disp: , Rfl:    Patient Care Team: Jerrol Banana., MD as PCP - General (Family Medicine) Hervey Ard  W, MD (General Surgery) Minna Merritts, MD as Consulting Physician (Cardiology) Vladimir Crofts, MD as Consulting Physician (Neurology) Alexis Frock, MD as Consulting Physician (Urology) Tyler Pita, MD as Consulting Physician (Radiation Oncology) Oneta Rack, MD (Dermatology)    Objective:    Vitals: BP 96/60 (BP Location: Right Arm, Patient Position: Sitting, Cuff Size: Large)   Pulse (!) 58   Temp (!) 97.3 F (36.3 C) (Other (Comment))   Resp 18   Ht 5\' 8"  (1.727 m)   Wt 170 lb (77.1 kg)   SpO2 99%   BMI 25.85 kg/m    Vitals:   10/06/19 1003  BP: 96/60  Pulse: (!) 58  Resp: 18  Temp: (!) 97.3 F (36.3 C)  TempSrc: Other (Comment)  SpO2: 99%  Weight: 170 lb (77.1 kg)  Height: 5\' 8"  (1.727 m)     Physical Exam Vitals signs reviewed.  Constitutional:      Appearance: He is well-developed.  HENT:     Head: Normocephalic and atraumatic.     Right Ear: External ear normal.     Left Ear: External ear normal.     Nose: Nose normal.  Eyes:     General: No scleral icterus.    Conjunctiva/sclera: Conjunctivae normal.     Pupils: Pupils are equal, round, and reactive to light.  Neck:     Thyroid: No thyromegaly.  Cardiovascular:     Rate and Rhythm: Normal rate and regular rhythm.     Heart sounds: Normal heart sounds.  Pulmonary:     Effort: Pulmonary effort is normal.     Breath sounds: Normal breath sounds.  Abdominal:     Palpations:  Abdomen is soft.  Lymphadenopathy:     Cervical: No cervical adenopathy.  Skin:    General: Skin is warm and dry.     Comments: SKs of face.  Neurological:     Mental Status: He is alert and oriented to person, place, and time. Mental status is at baseline.     Comments: Parkinson stigmata.  Psychiatric:        Mood and Affect: Mood normal.        Behavior: Behavior normal.        Thought Content: Thought content normal.        Judgment: Judgment normal.   MMSE score 30/30   Depression Screen PHQ 2/9 Scores 04/09/2019 12/23/2018 06/09/2018 09/19/2017  PHQ - 2 Score 0 0 0 0       Assessment & Plan:     Routine Health Maintenance and Physical Exam  Exercise Activities and Dietary recommendations Goals    . Increase water intake     Recommend to increase fluid intake to 6-8 glasses per day       Immunization History  Administered Date(s) Administered  . Influenza, High Dose Seasonal PF 10/07/2018  . Pneumococcal Conjugate-13 04/24/2016  . Pneumococcal Polysaccharide-23 10/07/2018  . Tdap 04/24/2016    Health Maintenance  Topic Date Due  . Hepatitis C Screening  [birth year]  . INFLUENZA VACCINE  07/18/2019  . COLONOSCOPY  02/11/2022  . TETANUS/TDAP  04/24/2026  . PNA vac Low Risk Adult  Completed     Discussed health benefits of physical activity, and encouraged him to engage in regular exercise appropriate for his age and condition.    -------------------------------------------------------------------- 1. Encounter for annual physical exam - MMSE  2. PARKINSON'S DISEASE Stable, followed by neurology.   Need for influenza vaccination - Fluad Quad(high Dose 65+)  1. Encounter for annual physical exam   2. Need for influenza vaccination  - Flu Vaccine QUAD High Dose(Fluad)  3. PARKINSON'S DISEASE Per neurology  4. Prostate cancer [facility]) Per urology  5. Essential hypertension Controlled  6. Mixed hyperlipidemia On atorvastatin  7.  Atherosclerosis of native coronary artery of native heart with stable angina pectoris ([place]) All risk factors treated 8.Possible MCI Follow up in 3 months.    Cranford Mon, MD  [facility]

## 2019-10-06 ENCOUNTER — Encounter: Payer: Self-pay | Admitting: Family Medicine

## 2019-10-06 ENCOUNTER — Ambulatory Visit (INDEPENDENT_AMBULATORY_CARE_PROVIDER_SITE_OTHER): Payer: Medicare HMO | Admitting: Family Medicine

## 2019-10-06 ENCOUNTER — Other Ambulatory Visit: Payer: Self-pay

## 2019-10-06 VITALS — BP 96/60 | HR 58 | Temp 97.3°F | Resp 18 | Ht 68.0 in | Wt 170.0 lb

## 2019-10-06 DIAGNOSIS — I25118 Atherosclerotic heart disease of native coronary artery with other forms of angina pectoris: Secondary | ICD-10-CM

## 2019-10-06 DIAGNOSIS — C61 Malignant neoplasm of prostate: Secondary | ICD-10-CM | POA: Diagnosis not present

## 2019-10-06 DIAGNOSIS — I10 Essential (primary) hypertension: Secondary | ICD-10-CM | POA: Diagnosis not present

## 2019-10-06 DIAGNOSIS — Z23 Encounter for immunization: Secondary | ICD-10-CM

## 2019-10-06 DIAGNOSIS — Z0000 Encounter for general adult medical examination without abnormal findings: Secondary | ICD-10-CM

## 2019-10-06 DIAGNOSIS — E782 Mixed hyperlipidemia: Secondary | ICD-10-CM | POA: Diagnosis not present

## 2019-10-06 DIAGNOSIS — G20 Parkinson's disease: Secondary | ICD-10-CM

## 2019-10-06 MED ORDER — VITAMIN D (ERGOCALCIFEROL) 1.25 MG (50000 UNIT) PO CAPS: 50000.0000 [IU] | ORAL_CAPSULE | ORAL | 12 refills | Status: AC

## 2019-11-17 DIAGNOSIS — C61 Malignant neoplasm of prostate: Secondary | ICD-10-CM | POA: Diagnosis not present

## 2019-11-20 DIAGNOSIS — L570 Actinic keratosis: Secondary | ICD-10-CM | POA: Diagnosis not present

## 2019-11-20 DIAGNOSIS — Z08 Encounter for follow-up examination after completed treatment for malignant neoplasm: Secondary | ICD-10-CM | POA: Diagnosis not present

## 2019-11-20 DIAGNOSIS — Z85828 Personal history of other malignant neoplasm of skin: Secondary | ICD-10-CM | POA: Diagnosis not present

## 2019-11-20 DIAGNOSIS — C44311 Basal cell carcinoma of skin of nose: Secondary | ICD-10-CM | POA: Diagnosis not present

## 2019-11-20 DIAGNOSIS — L821 Other seborrheic keratosis: Secondary | ICD-10-CM | POA: Diagnosis not present

## 2019-11-20 DIAGNOSIS — D485 Neoplasm of uncertain behavior of skin: Secondary | ICD-10-CM | POA: Diagnosis not present

## 2019-11-20 DIAGNOSIS — Z85820 Personal history of malignant melanoma of skin: Secondary | ICD-10-CM | POA: Diagnosis not present

## 2019-11-20 DIAGNOSIS — X32XXXA Exposure to sunlight, initial encounter: Secondary | ICD-10-CM | POA: Diagnosis not present

## 2019-11-20 DIAGNOSIS — D225 Melanocytic nevi of trunk: Secondary | ICD-10-CM | POA: Diagnosis not present

## 2019-11-23 DIAGNOSIS — G249 Dystonia, unspecified: Secondary | ICD-10-CM | POA: Diagnosis not present

## 2019-11-23 DIAGNOSIS — R4189 Other symptoms and signs involving cognitive functions and awareness: Secondary | ICD-10-CM | POA: Diagnosis not present

## 2019-11-23 DIAGNOSIS — G20 Parkinson's disease: Secondary | ICD-10-CM | POA: Diagnosis not present

## 2019-11-23 DIAGNOSIS — E538 Deficiency of other specified B group vitamins: Secondary | ICD-10-CM | POA: Diagnosis not present

## 2019-11-23 DIAGNOSIS — R69 Illness, unspecified: Secondary | ICD-10-CM | POA: Diagnosis not present

## 2019-11-23 DIAGNOSIS — E559 Vitamin D deficiency, unspecified: Secondary | ICD-10-CM | POA: Diagnosis not present

## 2019-11-23 DIAGNOSIS — R200 Anesthesia of skin: Secondary | ICD-10-CM | POA: Diagnosis not present

## 2019-12-01 ENCOUNTER — Other Ambulatory Visit: Payer: Self-pay | Admitting: Cardiovascular Disease

## 2019-12-03 ENCOUNTER — Encounter: Payer: Self-pay | Admitting: Adult Health

## 2019-12-03 ENCOUNTER — Ambulatory Visit (INDEPENDENT_AMBULATORY_CARE_PROVIDER_SITE_OTHER): Payer: Medicare HMO | Admitting: Adult Health

## 2019-12-03 ENCOUNTER — Other Ambulatory Visit: Payer: Self-pay

## 2019-12-03 ENCOUNTER — Ambulatory Visit: Payer: Self-pay | Admitting: *Deleted

## 2019-12-03 VITALS — BP 110/64 | HR 63 | Resp 15

## 2019-12-03 DIAGNOSIS — S50312A Abrasion of left elbow, initial encounter: Secondary | ICD-10-CM | POA: Diagnosis not present

## 2019-12-03 DIAGNOSIS — W101XXA Fall (on)(from) sidewalk curb, initial encounter: Secondary | ICD-10-CM

## 2019-12-03 NOTE — Telephone Encounter (Signed)
Patient had ov with [name] Peace, NP today at 1:20 pm.

## 2019-12-03 NOTE — Telephone Encounter (Signed)
FYI

## 2019-12-03 NOTE — Telephone Encounter (Signed)
Made we can work him in with Sharyn Lull if she has an opening?  If not maybe double book with me

## 2019-12-03 NOTE — Patient Instructions (Signed)
Abrasion  An abrasion is a cut or a scrape on the surface of your skin. An abrasion does not go through all the layers of your skin. It is important to take good care of your abrasion to prevent infection. Follow these instructions at home: Medicines  Take or apply over-the-counter and prescription medicines only as told by your doctor.  If you were prescribed an antibiotic medicine, apply it as told by your doctor. Do not stop using the antibiotic even if you start to feel better. Wound care  Clean the wound 2-3 times a day or as often as told by your doctor. To do this: ? Wash the wound with mild soap and water. ? Rinse off the soap. ? Pat a clean towel on the wound to dry it. Do not rub it.  Keep the bandage (dressing) clean and dry as told by your doctor.  Follow instructions from your doctor about how to take care of your wound. Make sure you: ? Wash your hands with soap and water before you change your bandage. If you cannot use soap and water, use hand sanitizer. ? Change your bandage as told by your doctor.  Check your wound every day for signs of infection. Check for: ? Redness, swelling, or pain. ? Fluid or blood. ? Warmth. ? Pus or a bad smell.  If directed, put ice on the injured area. To do this: ? Put ice in a plastic bag. ? Place a towel between your skin and the bag. ? Leave the ice on for 20 minutes, 2-3 times a day. General instructions  Do not take baths, swim, or use a hot tub until your doctor says it is okay.  If there is swelling, raise (elevate) the injured area above the level of your heart while you are sitting or lying down.  Keep all follow-up visits as told by your doctor. This is important. Contact a doctor if:  You were given a tetanus shot, and you have any of these where the needle went in: ? Swelling. ? Very bad pain. ? Redness. ? Bleeding.  You have a lot of pain, and medicine does not help.  You have any of these at the site of the  wound: ? More redness. ? More swelling. ? More pain. Get help right away if:  You have a red streak going away from your wound.  You have a fever.  You have fluid, blood, or pus coming from your wound.  There is a bad smell coming from your wound or bandage. Summary  An abrasion is a cut or a scrape on the surface of your skin.  Take good care of your abrasion so it does not get infected.  Clean the wound with mild soap and water, and change your bandage as told by your doctor.  Call your doctor if you have redness, swelling, or more pain in your wound.  Get help right away if you have a fever or if you have fluid, blood, pus, a bad smell, or a red streak coming from the wound. This information is not intended to replace advice given to you by your health care provider. Make sure you discuss any questions you have with your health care provider. Document Released: 05/21/2008 Document Revised: 11/15/2017 Document Reviewed: 07/25/2017 Elsevier Patient Education  2020 [employer].

## 2019-12-03 NOTE — Progress Notes (Addendum)
Patient: [name] [name]. Male    DOB: 13-May-1946   73 y.o.   MRN: [id number] Visit Date: 12/03/2019  Today's Provider: Marcille Buffy, FNP   Chief Complaint  Patient presents with  . Fall   Subjective:     Fall The accident occurred 12 to 24 hours ago. The fall occurred while walking. He fell from an unknown height. He landed on concrete. The volume of blood lost was minimal. The point of impact was the left elbow. The pain is present in the left elbow. The pain is moderate. Pertinent negatives include no abdominal pain, bowel incontinence, fever, headaches, hearing loss, hematuria, loss of consciousness, nausea, numbness, tingling, visual change or vomiting. He has tried nothing for the symptoms.    Last Reported Tdap- 04/24/16   He fell in the parking lot yesterday he had  left his wallet in his car and went out of the grocery store to get it. He put on mask and is glasses fogged up and he tripped over the yellow block in front of his car.  He caught his self with his right elbow.   Denies any head injury at all.  Left hip is sore. Denies any changes in gait, or leg or foot pain. He walked into the office today.  He does have Parkinson's' he  He denies any pain in the area.   Left hand no bruising, mild soreness. Normal range of motion. " been using it all day". Denies [place] fall.   He comes to the office today, he reports due to his wife put a gauze dressing on the area and it is stuck.   Denies any loss consciousness or head/ eye trauma.  Denies any vision changes.  Patient  denies any fever, body aches,chills, rash, chest pain, shortness of breath, nausea, vomiting, or diarrhea.     Allergies  Allergen Reactions  . Donepezil Nausea Only    Sick feeling  . Penicillins      Current Outpatient Medications:  .  Amantadine HCl 100 MG tablet, Take 50 mg by mouth 2 (two) times daily., Disp: , Rfl:  .  amLODipine (NORVASC) 10 MG tablet, TAKE 1 TABLET BY MOUTH  EVERY DAY, Disp: 90 tablet, Rfl: 2 .  aspirin 81 MG EC tablet, Take 81 mg by mouth daily. , Disp: , Rfl:  .  atorvastatin (LIPITOR) 80 MG tablet, TAKE ONE TABLET BY MOUTH EVERY EVENING AT 6PM, Disp: 90 tablet, Rfl: 2 .  benazepril (LOTENSIN) 40 MG tablet, TAKE 1 TABLET BY MOUTH EVERY DAY, Disp: 90 tablet, Rfl: 2 .  carbidopa-levodopa (SINEMET IR) 25-250 MG tablet, Take 1 tablet by mouth 3 (three) times daily. , Disp: , Rfl:  .  Omega-3 Fatty Acids (OMEGA 3 PO), Take 1,400 mg by mouth daily., Disp: , Rfl:  .  tamsulosin (FLOMAX) 0.4 MG CAPS capsule, Take 1 capsule (0.4 mg total) by mouth daily after supper., Disp: 30 capsule, Rfl: 5 .  Vitamin D, Ergocalciferol, (DRISDOL) 1.25 MG (50000 UT) CAPS capsule, Take 1 capsule (50,000 Units total) by mouth every 7 (seven) days., Disp: 4 capsule, Rfl: 12  Review of Systems  Constitutional: Negative for activity change, appetite change, chills, diaphoresis, fatigue, fever and unexpected weight change.  Respiratory: Negative.   Cardiovascular: Negative.   Gastrointestinal: Negative for abdominal pain, bowel incontinence, constipation, diarrhea, nausea and vomiting.  Genitourinary: Negative for hematuria.  Musculoskeletal: Positive for myalgias. Negative for arthralgias, back pain, gait problem, joint  swelling, neck pain and neck stiffness.  Skin: Positive for wound (left elbow abrasion / left upper back bandaid in place due to dermatology skin biopsy. ).  Neurological: Positive for tremors (parkinsons ). Negative for dizziness, tingling, seizures, loss of consciousness, syncope, facial asymmetry, speech difficulty, weakness, light-headedness, numbness and headaches.  Psychiatric/Behavioral: Negative.     Social History   Tobacco Use  . Smoking status: Never Smoker  . Smokeless tobacco: Never Used  Substance Use Topics  . Alcohol use: Yes    Alcohol/week: 21.0 standard drinks    Types: 21 Glasses of wine per week    Comment: 3 glasses of wine/day       Objective:   BP 110/64   Pulse 63   Resp 15   SpO2 96%  Vitals:   12/03/19 1329  BP: 110/64  Pulse: 63  Resp: 15  SpO2: 96%  There is no height or weight on file to calculate BMI.   Physical Exam Constitutional:      General: He is not in acute distress.    Appearance: Normal appearance. He is not ill-appearing, toxic-appearing or diaphoretic.     Comments: parkinson's stigmata/ tremors.   HENT:     Head: Normocephalic and atraumatic.     Right Ear: Tympanic membrane normal.     Left Ear: Tympanic membrane normal.     Nose: Nose normal. No rhinorrhea.     Mouth/Throat:     Comments: deferred due to covid office protocol, denies any trauma.  Eyes:     General: No scleral icterus.       Right eye: No discharge.        Left eye: No discharge.     Extraocular Movements: Extraocular movements intact.     Conjunctiva/sclera: Conjunctivae normal.     Pupils: Pupils are equal, round, and reactive to light.  Cardiovascular:     Rate and Rhythm: Normal rate and regular rhythm.     Pulses: Normal pulses.     Heart sounds: Normal heart sounds.  Pulmonary:     Effort: Pulmonary effort is normal.     Breath sounds: Normal breath sounds.  Abdominal:     General: There is no distension.     Palpations: Abdomen is soft. There is no mass.     Tenderness: There is no abdominal tenderness. There is no right CVA tenderness, left CVA tenderness, guarding or rebound.     Hernia: No hernia is present.  Musculoskeletal:        General: Tenderness (left elbow/ skin abrasion ) and signs of injury (left elbow skin abrasion Kathee Polite ) present. No swelling or deformity. Normal range of motion.     Cervical back: Normal range of motion and neck supple. No rigidity.     Right lower leg: No edema.     Left lower leg: No edema.  Skin:    General: Skin is warm.     Capillary Refill: Capillary refill takes less than 2 seconds.     Coloration: Skin is not jaundiced or pale.     Findings:  Bruising (left lateral dorsal hand near lower wrist is mildly bruised. pulse 2+ radial. range of motion is normal. Denies any pain with palpation. NO other bruises on hip/ back ./ or abdomen. ) and erythema (1st and 2nd degree abrasion left elbow.  approximately 3 cm x 1.5cm / hemostasis gained. Normal range of motion. old gauze dressing soaked off with normal saline and cleaned. ) present. No lesion  or rash.  Neurological:     General: No focal deficit present.     Mental Status: He is alert and oriented to person, place, and time. Mental status is at baseline.     Comments: parkinson's stigmata- no change in his baseline he reports He does not  use assistive device such as cane or walker. Marland Kitchen   Psychiatric:        Mood and Affect: Mood normal.        Behavior: Behavior normal.        Thought Content: Thought content normal.        Judgment: Judgment normal.   Gait at baseline.    No results found for any visits on 12/03/19.     Assessment & Plan       1. Fall (on)(from) sidewalk curb, initial encounter No head trauma. Left wrist sore, no bone tenderness or swelling, if he has any persistent pain he will call us for x ray. He declined hand/ wrist x ray at this time.   2. Abrasion of left elbow, initial encounter Tetanus up to date.  Area cleaned and dressed by Jimmie Molly. CMA.  Bandaged. No bleeding.  Wound care instructions given and when to call or return to the office.   Advised patient call the office or your primary care doctor for an appointment if no improvement within 72 hours or if any symptoms change or worsen at any time  Advised ER or urgent Care if after hours or on weekend. Call 911 for emergency symptoms at any time.Patinet verbalized understanding of all instructions given/reviewed and treatment plan and has no further questions or concerns at this time.    The entirety of the information documented in the History of Present Illness, Review of Systems and Physical Exam  were personally obtained by me. Portions of this information were initially documented by the  Certified Medical Assistant whose name is documented in [place] and reviewed by me for thoroughness and accuracy.  I have personally performed the exam and reviewed the chart and it is accurate to the best of my knowledge.  Haematologist has been used and any errors in dictation or transcription are unintentional.  Kelby Aline. [place], [facility]

## 2019-12-03 NOTE — Telephone Encounter (Signed)
Pt wife called and stated that she would like some advice. Pt fell yesterday and hit hand and arm. Pt did not hit his head or anything but the skin rolled off and would like some advice on what to do.; the pt a bandage on his arm, and it stuck to his skin; he is not sure how to get it off;   When they moistened the bandage, it is still stuck to his skin; recommendations made per nurse triage protocol; he verbalized understanding; he sees Dr Rosanna Randy. [employer]; pt transferred to [facility] for scheduling. Reason for Disposition . [1] High-risk adult (e.g., age > 55, osteoporosis, chronic steroid use) AND [2] still hurts  Answer Assessment - Initial Assessment Questions 1. MECHANISM: "How did the injury happen?"     Pt fell on asphalt 2. ONSET: "When did the injury happen?" (Minutes or hours ago)      12/02/2019 3. LOCATION: "Where is the injury located?"     Left arm (elbow to midway shoulder) 4. APPEARANCE of INJURY: "What does the injury look like?"      scraped 5. SEVERITY: "Can you use the arm normally?"      yes 6. SWELLING or BRUISING: "is there any swelling or bruising?" If so, ask: "How large is it? (e.g., inches, centimeters)      Bruised;  Left elbow to midway shoulder    7. PAIN: "Is there pain?" If so, ask: "How bad is the pain?"    (Scale 1-10; or mild, moderate, severe)     Yes; pain in left wrist; says "it is tight" 8. TETANUS: For any breaks in the skin, ask: "When was the last tetanus booster?"    Yes not sure 9. OTHER SYMPTOMS: "Do you have any other symptoms?"  (e.g., numbness in hand)   no 10. PREGNANCY: "Is there any chance you are pregnant?" "When was your last menstrual period?"       n/a  Protocols used: ARM INJURY-A-AH

## 2019-12-08 DIAGNOSIS — C61 Malignant neoplasm of prostate: Secondary | ICD-10-CM | POA: Diagnosis not present

## 2019-12-08 DIAGNOSIS — R3915 Urgency of urination: Secondary | ICD-10-CM | POA: Diagnosis not present

## 2019-12-23 DIAGNOSIS — C44311 Basal cell carcinoma of skin of nose: Secondary | ICD-10-CM | POA: Diagnosis not present

## 2019-12-23 DIAGNOSIS — Z85828 Personal history of other malignant neoplasm of skin: Secondary | ICD-10-CM | POA: Diagnosis not present

## 2019-12-25 ENCOUNTER — Ambulatory Visit: Payer: Medicare HMO

## 2019-12-29 NOTE — Progress Notes (Signed)
Subjective:   [name] [name]. is a 74 y.o. male who presents for Medicare Annual/Subsequent preventive examination.    This visit is being conducted through telemedicine due to the COVID-19 pandemic. This patient has given me verbal consent via doximity to conduct this visit, patient states they are participating from their home address. Some vital signs may be absent or patient reported.    Patient identification: identified by name, DOB, and current address  Review of Systems:  N/A  Cardiac Risk Factors include: advanced age (>17men, >66 women);dyslipidemia;hypertension;male gender     Objective:    Vitals: There were no vitals taken for this visit.  There is no height or weight on file to calculate BMI. Unable to obtain vitals due to visit being conducted via telephonically.   Advanced Directives 12/30/2019 12/23/2018 11/06/2018 12/25/2017 09/19/2017 02/11/2017 09/18/2016  Does Patient Have a Medical Advance Directive? Yes No No No No No No  Type of Paramedic of [place];Living will - - - - - -  Copy of [place] in Chart? No - copy requested - - - - - -  Would patient like information on creating a medical advance directive? - Yes (MAU/Ambulatory/Procedural Areas - Information given) No - Patient declined No - Patient declined No - Patient declined No - Patient declined -    Tobacco Social History   Tobacco Use  Smoking Status Never Smoker  Smokeless Tobacco Never Used     Counseling given: Not Answered   Clinical Intake:  Pre-visit preparation completed: Yes  Pain : No/denies pain Pain Score: 0-No pain     Nutritional Risks: None Diabetes: No  How often do you need to have someone help you when you read instructions, pamphlets, or other written materials from your doctor or pharmacy?: 1 - Never  Interpreter Needed?: No  Information entered by :: [facility], LPN  Past Medical History:  Diagnosis Date  . Acute  myocardial infarction of other inferior wall, episode of care unspecified May 1994  . Cancer (HCC)    skin / SCC  . Coronary atherosclerosis of native coronary artery   . History of shingles    waist area/right back side  . Occlusion and stenosis of carotid artery without mention of cerebral infarction   . Other and unspecified hyperlipidemia    Severe  . Parkinson's disease   . Peripheral vascular disease, unspecified ([place])   . Precancerous skin lesion   . Prostate cancer ([place]) 10/2016   low risk  . Shingles August 2015  . Squamous cell cancer of skin of nose   . Unspecified cerebral artery occlusion with cerebral infarction 2006   CVA  . Unspecified essential hypertension    Past Surgical History:  Procedure Laterality Date  . BIOPSY PROSTATE  11/12/2017  . COLONOSCOPY WITH PROPOFOL N/A 02/11/2017   Procedure: COLONOSCOPY WITH PROPOFOL;  Surgeon: Manya Silvas, MD;  Location: [facility] ENDOSCOPY;  Service: Endoscopy;  Laterality: N/A;  . HERNIA REPAIR  02/17/15   right inguinal hernia , laparoscopic placement of [place] 3-D max mesh  . HERNIA REPAIR  3/316   ventral hernia, umbilical, open repair  . MOHS SURGERY    . SKIN BIOPSY     skin cancer  . TRANSRECTAL ULTRASOUND  11/12/2017   Family History  Problem Relation Age of Onset  . Hypertension Mother   . Heart disease Father   . Hypertension Sister   . Hypertension Brother   . Heart disease Other   .  Prostate cancer Neg Hx   . Kidney cancer Neg Hx   . Colon cancer Neg Hx   . Pancreatic cancer Neg Hx   . Breast cancer Neg Hx    Social History   Socioeconomic History  . Marital status: Married    Spouse name: Not on file  . Number of children: 3  . Years of education: Not on file  . Highest education level: Bachelor's degree (e.g., BA, AB, BS)  Occupational History  . Occupation: GOLF TEACHER    Employer: [employer]    Comment: retired  Tobacco Use  . Smoking status: Never Smoker  . Smokeless tobacco:  Never Used  Substance and Sexual Activity  . Alcohol use: Yes    Alcohol/week: 21.0 standard drinks    Types: 21 Glasses of wine per week    Comment: 3 glasses of wine/day  . Drug use: No  . Sexual activity: Not Currently  Other Topics Concern  . Not on file  Social History Narrative   Married   Gets regular exercise   Social Determinants of Health   Financial Resource Strain: Low Risk   . Difficulty of Paying Living Expenses: Not hard at all  Food Insecurity: No Food Insecurity  . Worried About Charity fundraiser in the Last Year: Never true  . Ran Out of Food in the Last Year: Never true  Transportation Needs: No Transportation Needs  . Lack of Transportation (Medical): No  . Lack of Transportation (Non-Medical): No  Physical Activity: Sufficiently Active  . Days of Exercise per Week: 5 days  . Minutes of Exercise per Session: 70 min  Stress: No Stress Concern Present  . Feeling of Stress : Not at all  Social Connections: Not Isolated  . Frequency of Communication with Friends and Family: Three times a week  . Frequency of Social Gatherings with Friends and Family: More than three times a week  . Attends Religious Services: More than 4 times per year  . Active Member of Clubs or Organizations: Yes  . Attends Archivist Meetings: More than 4 times per year  . Marital Status: Married    Outpatient Encounter Medications as of 12/30/2019  Medication Sig  . Amantadine HCl 100 MG tablet Take 50 mg by mouth 2 (two) times daily.  Marland Kitchen amLODipine (NORVASC) 10 MG tablet TAKE 1 TABLET BY MOUTH EVERY DAY  . aspirin 81 MG EC tablet Take 81 mg by mouth daily.   Marland Kitchen atorvastatin (LIPITOR) 80 MG tablet TAKE ONE TABLET BY MOUTH EVERY EVENING AT 6PM  . benazepril (LOTENSIN) 40 MG tablet TAKE 1 TABLET BY MOUTH EVERY DAY  . carbidopa-levodopa (SINEMET IR) 25-250 MG tablet Take 1 tablet by mouth 3 (three) times daily.   . carbidopa-levodopa-entacapone (STALEVO) 50-200-200 MG tablet  Take 1 tablet by mouth at bedtime.  . Omega-3 Fatty Acids (OMEGA 3 PO) Take 1,400 mg by mouth daily.  . tamsulosin (FLOMAX) 0.4 MG CAPS capsule Take 1 capsule (0.4 mg total) by mouth daily after supper.  . Vitamin D, Ergocalciferol, (DRISDOL) 1.25 MG (50000 UT) CAPS capsule Take 1 capsule (50,000 Units total) by mouth every 7 (seven) days.   No facility-administered encounter medications on file as of 12/30/2019.    Activities of Daily Living In your present state of health, do you have any difficulty performing the following activities: 12/30/2019  Hearing? N  Vision? N  Difficulty concentrating or making decisions? N  Walking or climbing stairs? N  Dressing  or bathing? N  Doing errands, shopping? N  Preparing Food and eating ? N  Using the Toilet? N  In the past six months, have you accidently leaked urine? Y  Comment Due to post prostate cancer treatment.  Do you have problems with loss of bowel control? N  Managing your Medications? N  Managing your Finances? N  Housekeeping or managing your Housekeeping? N  Some recent data might be hidden    Patient Care Team: Jerrol Banana., MD as PCP - General (Family Medicine) Byrnett, Forest Gleason, MD (General Surgery) Minna Merritts, MD as Consulting Physician (Cardiology) Vladimir Crofts, MD as Consulting Physician (Neurology) Alexis Frock, MD as Consulting Physician (Urology) Tyler Pita, MD as Consulting Physician (Radiation Oncology) Oneta Rack, MD (Dermatology)   Assessment:   This is a routine wellness examination for Ciro.  Exercise Activities and Dietary recommendations Current Exercise Habits: Home exercise routine, Type of exercise: treadmill;walking;strength training/weights, Time (Minutes): > 60, Frequency (Times/Week): 5, Weekly Exercise (Minutes/Week): 0, Intensity: Moderate, Exercise limited by: None identified  Goals    . Increase water intake     Recommend to increase fluid intake to 6-8  glasses per day       Fall Risk: Fall Risk  12/30/2019 04/09/2019 12/23/2018 06/09/2018 09/19/2017  Falls in the past year? 1 0 0 No No  Number falls in past yr: 1 - 0 - -  Injury with Fall? 0 - 0 - -  Risk for fall due to : - - - - Impaired vision  Risk for fall due to: Comment - - - - needs updated eye exam and eye glass Rx  Follow up Falls prevention discussed - - - -    FALL RISK PREVENTION PERTAINING TO THE HOME:  Any stairs in or around the home? Yes  If so, are there any without handrails? No   Home free of loose throw rugs in walkways, pet beds, electrical cords, etc? Yes  Adequate lighting in your home to reduce risk of falls? Yes   ASSISTIVE DEVICES UTILIZED TO PREVENT FALLS:  Life alert? No  Use of a cane, walker or w/c? No  Grab bars in the bathroom? Yes  Shower chair or bench in shower? No  Elevated toilet seat or a handicapped toilet? No   TIMED UP AND GO:  Was the test performed? No .    Depression Screen PHQ 2/9 Scores 12/30/2019 04/09/2019 12/23/2018 06/09/2018  PHQ - 2 Score 0 0 0 0    Cognitive Function: Not due today.  MMSE - Mini Mental State Exam 10/06/2019 09/26/2017  Orientation to time 5 5  Orientation to Place 5 5  Registration 3 3  Attention/ Calculation 5 5  Recall 3 3  Language- name 2 objects 2 2  Language- repeat 1 1  Language- follow 3 step command 3 3  Language- read & follow direction 1 1  Write a sentence 1 1  Copy design 1 1  Total score 30 30     6CIT Screen 12/23/2018 09/18/2016  What Year? 0 points 0 points  What month? 0 points 0 points  What time? 0 points 3 points  Count back from 20 0 points 0 points  Months in reverse 0 points 0 points  Repeat phrase 0 points 0 points  Total Score 0 3    Immunization History  Administered Date(s) Administered  . Fluad Quad(high Dose 65+) 10/06/2019  . Influenza, High Dose Seasonal PF 10/07/2018  .  Pneumococcal Conjugate-13 04/24/2016  . Pneumococcal Polysaccharide-23 10/07/2018  .  Tdap 04/24/2016    Qualifies for Shingles Vaccine? Yes . Due for Shingrix. Pt has been advised to call insurance company to determine out of pocket expense. Advised may also receive vaccine at local pharmacy or Health Dept. Verbalized acceptance and understanding.  Tdap: Up to date  Flu Vaccine: Up to date  Pneumococcal Vaccine: Completed series  Screening Tests Health Maintenance  Topic Date Due  . Hepatitis C Screening  [birth year]  . COLONOSCOPY  02/11/2022  . TETANUS/TDAP  04/24/2026  . INFLUENZA VACCINE  Completed  . PNA vac Low Risk Adult  Completed   Cancer Screenings:  Colorectal Screening: Completed 02/11/17. Repeat every 5 years.   Lung Cancer Screening: (Low Dose CT Chest recommended if Age 21-80 years, 30 pack-year currently smoking OR have quit w/in 15years.) does not qualify.   Additional Screening:  Hepatitis C Screening: does qualify and would like this added to next apts blood work orders.   Vision Screening: Recommended annual ophthalmology exams for early detection of glaucoma and other disorders of the eye.  Dental Screening: Recommended annual dental exams for proper oral hygiene  Community Resource Referral:  CRR required this visit?  No        Plan:  I have personally reviewed and addressed the Medicare Annual Wellness questionnaire and have noted the following in the patient's chart:  A. Medical and social history B. Use of alcohol, tobacco or illicit drugs  C. Current medications and supplements D. Functional ability and status E.  Nutritional status F.  Physical activity G. Advance directives H. List of other physicians I.  Hospitalizations, surgeries, and ER visits in previous 12 months J.  [place] such as hearing and vision if needed, cognitive and depression L. Referrals and appointments   In addition, I have reviewed and discussed with patient certain preventive protocols, quality metrics, and best practice  recommendations. A written personalized care plan for preventive services as well as general preventive health recommendations were provided to patient.   Glendora Score, [place]  [id number] Nurse Health Advisor   Nurse Notes: Pt would like to add the Hep C lab order to next appointments blood works orders.

## 2019-12-30 ENCOUNTER — Other Ambulatory Visit: Payer: Self-pay

## 2019-12-30 ENCOUNTER — Ambulatory Visit (INDEPENDENT_AMBULATORY_CARE_PROVIDER_SITE_OTHER): Payer: Medicare HMO

## 2019-12-30 DIAGNOSIS — Z0000 Encounter for general adult medical examination without abnormal findings: Secondary | ICD-10-CM

## 2019-12-30 NOTE — Patient Instructions (Addendum)
[name] , Thank you for taking time to come for your Medicare Wellness Visit. I appreciate your ongoing commitment to your health goals. Please review the following plan we discussed and let me know if I can assist you in the future.   Screening recommendations/referrals: Colonoscopy: Up to date, due 01/2022 Recommended yearly ophthalmology/optometry visit for glaucoma screening and checkup Recommended yearly dental visit for hygiene and checkup  Vaccinations: Influenza vaccine: Up to date Pneumococcal vaccine: Completed series Tdap vaccine: Up to date, due 04/2026 Shingles vaccine: Pt declines today.     Advanced directives: Please bring a copy of your POA (Power of Attorney) and/or Living Will to your next appointment.   Conditions/risks identified: Fall risk prevention discussed today. Recommend to increase water intake to 6-8 8 oz glasses a day.   Next appointment: 01/06/20 @ 8:40 AM with Dr Rosanna Randy. Declined scheduling an AWV for 2022 at this time.   Preventive Care 14 Years and Older, Male Preventive care refers to lifestyle choices and visits with your health care provider that can promote health and wellness. What does preventive care include?  A yearly physical exam. This is also called an annual well check.  Dental exams once or twice a year.  Routine eye exams. Ask your health care provider how often you should have your eyes checked.  Personal lifestyle choices, including:  Daily care of your teeth and gums.  Regular physical activity.  Eating a healthy diet.  Avoiding tobacco and drug use.  Limiting alcohol use.  Practicing safe sex.  Taking low doses of aspirin every day.  Taking vitamin and mineral supplements as recommended by your health care provider. What happens during an annual well check? The services and screenings done by your health care provider during your annual well check will depend on your age, overall health, lifestyle risk factors, and  family history of disease. Counseling  Your health care provider may ask you questions about your:  Alcohol use.  Tobacco use.  Drug use.  Emotional well-being.  Home and relationship well-being.  Sexual activity.  Eating habits.  History of falls.  Memory and ability to understand (cognition).  Work and work Statistician. Screening  You may have the following tests or measurements:  Height, weight, and BMI.  Blood pressure.  Lipid and cholesterol levels. These may be checked every 5 years, or more frequently if you are over 62 years old.  Skin check.  Lung cancer screening. You may have this screening every year starting at age 31 if you have a 30-pack-year history of smoking and currently smoke or have quit within the past 15 years.  Fecal occult blood test (FOBT) of the stool. You may have this test every year starting at age 63.  Flexible sigmoidoscopy or colonoscopy. You may have a sigmoidoscopy every 5 years or a colonoscopy every 10 years starting at age 45.  Prostate cancer screening. Recommendations will vary depending on your family history and other risks.  Hepatitis C blood test.  Hepatitis B blood test.  Sexually transmitted disease (STD) testing.  Diabetes screening. This is done by checking your blood sugar (glucose) after you have not eaten for a while (fasting). You may have this done every 1-3 years.  Abdominal aortic aneurysm (AAA) screening. You may need this if you are a current or former smoker.  Osteoporosis. You may be screened starting at age 37 if you are at high risk. Talk with your health care provider about your test results, treatment options,  and if necessary, the need for more tests. Vaccines  Your health care provider may recommend certain vaccines, such as:  Influenza vaccine. This is recommended every year.  Tetanus, diphtheria, and acellular pertussis (Tdap, Td) vaccine. You may need a Td booster every 10 years.  Zoster  vaccine. You may need this after age 46.  Pneumococcal 13-valent conjugate (PCV13) vaccine. One dose is recommended after age 79.  Pneumococcal polysaccharide (PPSV23) vaccine. One dose is recommended after age 4. Talk to your health care provider about which screenings and vaccines you need and how often you need them. This information is not intended to replace advice given to you by your health care provider. Make sure you discuss any questions you have with your health care provider. Document Released: 12/30/2015 Document Revised: 08/22/2016 Document Reviewed: 10/04/2015 Elsevier Interactive Patient Education  2017 [place] Prevention in the Home Falls can cause injuries. They can happen to people of all ages. There are many things you can do to make your home safe and to help prevent falls. What can I do on the outside of my home?  Regularly fix the edges of walkways and driveways and fix any cracks.  Remove anything that might make you trip as you walk through a door, such as a raised step or threshold.  Trim any bushes or trees on the path to your home.  Use bright outdoor lighting.  Clear any walking paths of anything that might make someone trip, such as rocks or tools.  Regularly check to see if handrails are loose or broken. Make sure that both sides of any steps have handrails.  Any raised decks and porches should have guardrails on the edges.  Have any leaves, snow, or ice cleared regularly.  Use sand or salt on walking paths during winter.  Clean up any spills in your garage right away. This includes oil or grease spills. What can I do in the bathroom?  Use night lights.  Install grab bars by the toilet and in the tub and shower. Do not use towel bars as grab bars.  Use non-skid mats or decals in the tub or shower.  If you need to sit down in the shower, use a plastic, non-slip stool.  Keep the floor dry. Clean up any water that spills on the  floor as soon as it happens.  Remove soap buildup in the tub or shower regularly.  Attach bath mats securely with double-sided non-slip rug tape.  Do not have throw rugs and other things on the floor that can make you trip. What can I do in the bedroom?  Use night lights.  Make sure that you have a light by your bed that is easy to reach.  Do not use any sheets or blankets that are too big for your bed. They should not hang down onto the floor.  Have a firm chair that has side arms. You can use this for support while you get dressed.  Do not have throw rugs and other things on the floor that can make you trip. What can I do in the kitchen?  Clean up any spills right away.  Avoid walking on wet floors.  Keep items that you use a lot in easy-to-reach places.  If you need to reach something above you, use a strong step stool that has a grab bar.  Keep electrical cords out of the way.  Do not use floor polish or wax that makes floors slippery. If  you must use wax, use non-skid floor wax.  Do not have throw rugs and other things on the floor that can make you trip. What can I do with my stairs?  Do not leave any items on the stairs.  Make sure that there are handrails on both sides of the stairs and use them. Fix handrails that are broken or loose. Make sure that handrails are as long as the stairways.  Check any carpeting to make sure that it is firmly attached to the stairs. Fix any carpet that is loose or worn.  Avoid having throw rugs at the top or bottom of the stairs. If you do have throw rugs, attach them to the floor with carpet tape.  Make sure that you have a light switch at the top of the stairs and the bottom of the stairs. If you do not have them, ask someone to add them for you. What else can I do to help prevent falls?  Wear shoes that:  Do not have high heels.  Have rubber bottoms.  Are comfortable and fit you well.  Are closed at the toe. Do not wear  sandals.  If you use a stepladder:  Make sure that it is fully opened. Do not climb a closed stepladder.  Make sure that both sides of the stepladder are locked into place.  Ask someone to hold it for you, if possible.  Clearly mark and make sure that you can see:  Any grab bars or handrails.  First and last steps.  Where the edge of each step is.  Use tools that help you move around (mobility aids) if they are needed. These include:  Canes.  Walkers.  Scooters.  Crutches.  Turn on the lights when you go into a dark area. Replace any light bulbs as soon as they burn out.  Set up your furniture so you have a clear path. Avoid moving your furniture around.  If any of your floors are uneven, fix them.  If there are any pets around you, be aware of where they are.  Review your medicines with your doctor. Some medicines can make you feel dizzy. This can increase your chance of falling. Ask your doctor what other things that you can do to help prevent falls. This information is not intended to replace advice given to you by your health care provider. Make sure you discuss any questions you have with your health care provider. Document Released: 09/29/2009 Document Revised: 05/10/2016 Document Reviewed: 01/07/2015 Elsevier Interactive Patient Education  2017 [employer].

## 2020-01-06 ENCOUNTER — Ambulatory Visit: Payer: Self-pay | Admitting: Family Medicine

## 2020-01-19 NOTE — Progress Notes (Signed)
Patient: [name] [name]. Male    DOB: [birth year]   74 y.o.   MRN: [id number] Visit Date: 01/21/2020  Today's Provider: Wilhemena Durie, MD   Chief Complaint  Patient presents with  . Hypertension  . Hyperlipidemia   Subjective:     HPI  Overall patient is doing well despite the pandemic.  All problems are stable.  Prostate cancer followed by urology. PARKINSON'S DISEASE From 10/06/2019-Stable, followed by neurology.  Essential hypertension From 10/06/2019-Controlled  Mixed hyperlipidemia From 10/06/2019-On atorvastatin.  Atherosclerosis of native coronary artery of native heart with stable angina pectoris ([place]) From 10/06/2019-All risk factors treated.  Possible MCI From 10/06/2019-Follow up in 3 months.   Allergies  Allergen Reactions  . Donepezil Nausea Only    Sick feeling  . Penicillins      Current Outpatient Medications:  .  Amantadine HCl 100 MG tablet, Take 50 mg by mouth 2 (two) times daily., Disp: , Rfl:  .  amLODipine (NORVASC) 10 MG tablet, TAKE 1 TABLET BY MOUTH EVERY DAY, Disp: 90 tablet, Rfl: 2 .  aspirin 81 MG EC tablet, Take 81 mg by mouth daily. , Disp: , Rfl:  .  atorvastatin (LIPITOR) 80 MG tablet, TAKE ONE TABLET BY MOUTH EVERY EVENING AT 6PM, Disp: 90 tablet, Rfl: 2 .  benazepril (LOTENSIN) 40 MG tablet, TAKE 1 TABLET BY MOUTH EVERY DAY, Disp: 90 tablet, Rfl: 2 .  carbidopa-levodopa (SINEMET IR) 25-250 MG tablet, Take 1 tablet by mouth 3 (three) times daily. , Disp: , Rfl:  .  carbidopa-levodopa-entacapone (STALEVO) 50-200-200 MG tablet, Take 1 tablet by mouth at bedtime., Disp: , Rfl:  .  Omega-3 Fatty Acids (OMEGA 3 PO), Take 1,400 mg by mouth daily., Disp: , Rfl:  .  tamsulosin (FLOMAX) 0.4 MG CAPS capsule, Take 1 capsule (0.4 mg total) by mouth daily after supper., Disp: 30 capsule, Rfl: 5 .  Vitamin D, Ergocalciferol, (DRISDOL) 1.25 MG (50000 UT) CAPS capsule, Take 1 capsule (50,000 Units total) by mouth every 7 (seven) days.,  Disp: 4 capsule, Rfl: 12  Review of Systems  Constitutional: Negative for appetite change, chills and fever.  HENT: Negative.   Eyes: Negative.   Respiratory: Negative for chest tightness, shortness of breath and wheezing.   Cardiovascular: Negative for chest pain and palpitations.  Gastrointestinal: Negative for abdominal pain, nausea and vomiting.  Endocrine: Negative.   Allergic/Immunologic: Negative.   Neurological: Positive for tremors.  Hematological: Negative.   Psychiatric/Behavioral: Negative.     Social History   Tobacco Use  . Smoking status: Never Smoker  . Smokeless tobacco: Never Used  Substance Use Topics  . Alcohol use: Yes    Alcohol/week: 21.0 standard drinks    Types: 21 Glasses of wine per week    Comment: 3 glasses of wine/day      Objective:   BP 117/66 (BP Location: Right Arm, Patient Position: Sitting, Cuff Size: Normal)   Pulse (!) 54   Temp (!) 96.8 F (36 C) (Temporal)   Wt 171 lb 9.6 oz (77.8 kg)   SpO2 99%   BMI 26.09 kg/m  Vitals:   01/21/20 0946  BP: 117/66  Pulse: (!) 54  Temp: (!) 96.8 F (36 C)  TempSrc: Temporal  SpO2: 99%  Weight: 171 lb 9.6 oz (77.8 kg)  Body mass index is 26.09 kg/m.   Physical Exam Vitals reviewed.  Constitutional:      Appearance: He is well-developed.  HENT:  Head: Normocephalic and atraumatic.     Right Ear: External ear normal.     Left Ear: External ear normal.     Nose: Nose normal.  Eyes:     General: No scleral icterus.    Conjunctiva/sclera: Conjunctivae normal.     Pupils: Pupils are equal, round, and reactive to light.  Neck:     Thyroid: No thyromegaly.  Cardiovascular:     Rate and Rhythm: Normal rate and regular rhythm.     Heart sounds: Normal heart sounds.  Pulmonary:     Effort: Pulmonary effort is normal.     Breath sounds: Normal breath sounds.  Abdominal:     Palpations: Abdomen is soft.  Lymphadenopathy:     Cervical: No cervical adenopathy.  Skin:    General:  Skin is warm and dry.     Comments: SKs of face.  Neurological:     Mental Status: He is alert and oriented to person, place, and time.     Comments: Mild Parkinson stigmata.  Psychiatric:        Behavior: Behavior normal.        Thought Content: Thought content normal.        Judgment: Judgment normal.      No results found for any visits on 01/21/20.     Assessment & Plan    1. Atherosclerosis of native coronary artery of native heart with stable angina pectoris ([place]) All risk factors treated  2. PARKINSON'S DISEASE OnStalevo at bedtime and Sinemet during the day.  3. Malignant neoplasm of prostate ([place]) On tamsulosin for symptoms  4. Mixed hyperlipidemia On atorvastatin benazepril and amlodipine  5. MCI (mild cognitive impairment) MMSE on physical this summer.     Richard Cranford Mon, MD  [facility]

## 2020-01-21 ENCOUNTER — Ambulatory Visit (INDEPENDENT_AMBULATORY_CARE_PROVIDER_SITE_OTHER): Payer: Medicare HMO | Admitting: Family Medicine

## 2020-01-21 ENCOUNTER — Other Ambulatory Visit: Payer: Self-pay

## 2020-01-21 ENCOUNTER — Encounter: Payer: Self-pay | Admitting: Family Medicine

## 2020-01-21 VITALS — BP 117/66 | HR 54 | Temp 96.8°F | Wt 171.6 lb

## 2020-01-21 DIAGNOSIS — C61 Malignant neoplasm of prostate: Secondary | ICD-10-CM | POA: Diagnosis not present

## 2020-01-21 DIAGNOSIS — E782 Mixed hyperlipidemia: Secondary | ICD-10-CM | POA: Diagnosis not present

## 2020-01-21 DIAGNOSIS — I25118 Atherosclerotic heart disease of native coronary artery with other forms of angina pectoris: Secondary | ICD-10-CM | POA: Diagnosis not present

## 2020-01-21 DIAGNOSIS — G3184 Mild cognitive impairment, so stated: Secondary | ICD-10-CM

## 2020-01-21 DIAGNOSIS — G20 Parkinson's disease: Secondary | ICD-10-CM

## 2020-03-07 DIAGNOSIS — G20 Parkinson's disease: Secondary | ICD-10-CM | POA: Diagnosis not present

## 2020-03-07 DIAGNOSIS — I252 Old myocardial infarction: Secondary | ICD-10-CM | POA: Diagnosis not present

## 2020-03-07 DIAGNOSIS — N400 Enlarged prostate without lower urinary tract symptoms: Secondary | ICD-10-CM | POA: Diagnosis not present

## 2020-03-07 DIAGNOSIS — Z008 Encounter for other general examination: Secondary | ICD-10-CM | POA: Diagnosis not present

## 2020-03-07 DIAGNOSIS — I739 Peripheral vascular disease, unspecified: Secondary | ICD-10-CM | POA: Diagnosis not present

## 2020-03-07 DIAGNOSIS — I10 Essential (primary) hypertension: Secondary | ICD-10-CM | POA: Diagnosis not present

## 2020-03-07 DIAGNOSIS — E785 Hyperlipidemia, unspecified: Secondary | ICD-10-CM | POA: Diagnosis not present

## 2020-03-07 DIAGNOSIS — Z7982 Long term (current) use of aspirin: Secondary | ICD-10-CM | POA: Diagnosis not present

## 2020-03-07 DIAGNOSIS — G8929 Other chronic pain: Secondary | ICD-10-CM | POA: Diagnosis not present

## 2020-03-07 DIAGNOSIS — Z8546 Personal history of malignant neoplasm of prostate: Secondary | ICD-10-CM | POA: Diagnosis not present

## 2020-03-07 DIAGNOSIS — R32 Unspecified urinary incontinence: Secondary | ICD-10-CM | POA: Diagnosis not present

## 2020-03-30 DIAGNOSIS — R69 Illness, unspecified: Secondary | ICD-10-CM | POA: Diagnosis not present

## 2020-05-23 DIAGNOSIS — R4189 Other symptoms and signs involving cognitive functions and awareness: Secondary | ICD-10-CM | POA: Diagnosis not present

## 2020-05-23 DIAGNOSIS — R2689 Other abnormalities of gait and mobility: Secondary | ICD-10-CM | POA: Diagnosis not present

## 2020-05-23 DIAGNOSIS — E559 Vitamin D deficiency, unspecified: Secondary | ICD-10-CM | POA: Diagnosis not present

## 2020-05-23 DIAGNOSIS — G20 Parkinson's disease: Secondary | ICD-10-CM | POA: Diagnosis not present

## 2020-05-23 DIAGNOSIS — E538 Deficiency of other specified B group vitamins: Secondary | ICD-10-CM | POA: Diagnosis not present

## 2020-05-23 DIAGNOSIS — G4700 Insomnia, unspecified: Secondary | ICD-10-CM | POA: Diagnosis not present

## 2020-05-25 DIAGNOSIS — D2262 Melanocytic nevi of left upper limb, including shoulder: Secondary | ICD-10-CM | POA: Diagnosis not present

## 2020-05-25 DIAGNOSIS — D225 Melanocytic nevi of trunk: Secondary | ICD-10-CM | POA: Diagnosis not present

## 2020-05-25 DIAGNOSIS — X32XXXA Exposure to sunlight, initial encounter: Secondary | ICD-10-CM | POA: Diagnosis not present

## 2020-05-25 DIAGNOSIS — D2261 Melanocytic nevi of right upper limb, including shoulder: Secondary | ICD-10-CM | POA: Diagnosis not present

## 2020-05-25 DIAGNOSIS — Z85820 Personal history of malignant melanoma of skin: Secondary | ICD-10-CM | POA: Diagnosis not present

## 2020-05-25 DIAGNOSIS — Z85828 Personal history of other malignant neoplasm of skin: Secondary | ICD-10-CM | POA: Diagnosis not present

## 2020-05-25 DIAGNOSIS — L570 Actinic keratosis: Secondary | ICD-10-CM | POA: Diagnosis not present

## 2020-05-25 DIAGNOSIS — D0462 Carcinoma in situ of skin of left upper limb, including shoulder: Secondary | ICD-10-CM | POA: Diagnosis not present

## 2020-05-25 DIAGNOSIS — L218 Other seborrheic dermatitis: Secondary | ICD-10-CM | POA: Diagnosis not present

## 2020-05-25 DIAGNOSIS — D485 Neoplasm of uncertain behavior of skin: Secondary | ICD-10-CM | POA: Diagnosis not present

## 2020-06-02 DIAGNOSIS — C61 Malignant neoplasm of prostate: Secondary | ICD-10-CM | POA: Diagnosis not present

## 2020-06-09 DIAGNOSIS — C44629 Squamous cell carcinoma of skin of left upper limb, including shoulder: Secondary | ICD-10-CM | POA: Diagnosis not present

## 2020-06-09 DIAGNOSIS — C61 Malignant neoplasm of prostate: Secondary | ICD-10-CM | POA: Diagnosis not present

## 2020-06-09 DIAGNOSIS — R3915 Urgency of urination: Secondary | ICD-10-CM | POA: Diagnosis not present

## 2020-06-09 DIAGNOSIS — D0462 Carcinoma in situ of skin of left upper limb, including shoulder: Secondary | ICD-10-CM | POA: Diagnosis not present

## 2020-08-11 ENCOUNTER — Other Ambulatory Visit: Payer: Self-pay | Admitting: Cardiovascular Disease

## 2020-08-23 DIAGNOSIS — R200 Anesthesia of skin: Secondary | ICD-10-CM | POA: Diagnosis not present

## 2020-08-23 DIAGNOSIS — R4189 Other symptoms and signs involving cognitive functions and awareness: Secondary | ICD-10-CM | POA: Diagnosis not present

## 2020-08-23 DIAGNOSIS — R2689 Other abnormalities of gait and mobility: Secondary | ICD-10-CM | POA: Diagnosis not present

## 2020-08-23 DIAGNOSIS — G20 Parkinson's disease: Secondary | ICD-10-CM | POA: Diagnosis not present

## 2020-08-23 DIAGNOSIS — G249 Dystonia, unspecified: Secondary | ICD-10-CM | POA: Diagnosis not present

## 2020-08-23 DIAGNOSIS — E559 Vitamin D deficiency, unspecified: Secondary | ICD-10-CM | POA: Diagnosis not present

## 2020-08-23 DIAGNOSIS — E538 Deficiency of other specified B group vitamins: Secondary | ICD-10-CM | POA: Diagnosis not present

## 2020-09-05 ENCOUNTER — Other Ambulatory Visit: Payer: Self-pay | Admitting: Cardiovascular Disease

## 2020-10-13 NOTE — Progress Notes (Signed)
Complete physical exam   Patient: [name] [name].   DOB: 28-Apr-1946   74 y.o. Male  MRN: [id number] Visit Date: 10/17/2020  Today's healthcare provider: Wilhemena Durie, MD   Chief Complaint  Patient presents with  . Annual Exam   Subjective    [name] Hartel. is a 74 y.o. male who presents today for a complete physical exam.  He reports consuming a general diet. Home exercise routine includes walking 1 hrs per day. He generally feels well. He reports sleeping well. He does not have additional problems to discuss today.  Patient received radiation for prostate cancer and last PSA was 0.35 per urology.  He does have nocturia x3-4. HPI  Patient had AWV with [facility] on 12/30/2019.  Past Medical History:  Diagnosis Date  . Acute myocardial infarction of other inferior wall, episode of care unspecified May 1994  . Cancer (HCC)    skin / SCC  . Coronary atherosclerosis of native coronary artery   . History of shingles    waist area/right back side  . Occlusion and stenosis of carotid artery without mention of cerebral infarction   . Other and unspecified hyperlipidemia    Severe  . Parkinson's disease   . Peripheral vascular disease, unspecified ([place])   . Precancerous skin lesion   . Prostate cancer ([place]) 10/2016   low risk  . Shingles August 2015  . Squamous cell cancer of skin of nose   . Unspecified cerebral artery occlusion with cerebral infarction 2006   CVA  . Unspecified essential hypertension    Past Surgical History:  Procedure Laterality Date  . BIOPSY PROSTATE  11/12/2017  . COLONOSCOPY WITH PROPOFOL N/A 02/11/2017   Procedure: COLONOSCOPY WITH PROPOFOL;  Surgeon: Manya Silvas, MD;  [facility] END[facility]COPY;  Service: Endoscopy;  Laterality: N/A;  . HERNIA REPAIR  02/17/15   right inguinal hernia , laparoscopic placement of Bard 3-D max mesh  . HERNIA REPAIR  3/316   ventral hernia, umbilical, open repair  . MOHS SURGERY    . SKIN BIOPSY     skin  cancer  . TRANSRECTAL ULTRASOUND  11/12/2017   Social History   Socioeconomic History  . Marital status: Married    Spouse name: Not on file  . Number of children: 3  . Years of education: Not on file  . Highest education level: Bachelor's degree (e.g., BA, AB, BS)  Occupational History  . Occupation: GOLF TEACHER    Employer: [employer]    Comment: retired  Tobacco Use  . Smoking status: Never Smoker  . Smokeless tobacco: Never Used  Vaping Use  . Vaping Use: Never used  Substance and Sexual Activity  . Alcohol use: Yes    Alcohol/week: 21.0 standard drinks    Types: 21 Glasses of wine per week    Comment: 3 glasses of wine/day  . Drug use: No  . Sexual activity: Not Currently  Other Topics Concern  . Not on file  Social History Narrative   Married   Gets regular exercise   Social Determinants of Health   Financial Resource Strain: Low Risk   . Difficulty of Paying Living Expenses: Not hard at all  Food Insecurity: No Food Insecurity  . Worried About Charity fundraiser in the Last Year: Never true  . Ran Out of Food in the Last Year: Never true  Transportation Needs: No Transportation Needs  . Lack of Transportation (Medical): No  .  Lack of Transportation (Non-Medical): No  Physical Activity: Sufficiently Active  . Days of Exercise per Week: 5 days  . Minutes of Exercise per Session: 70 min  Stress: No Stress Concern Present  . Feeling of Stress : Not at all  Social Connections: Socially Integrated  . Frequency of Communication with Friends and Family: Three times a week  . Frequency of Social Gatherings with Friends and Family: More than three times a week  . Attends Religious Services: More than 4 times per year  . Active Member of Clubs or Organizations: Yes  . Attends Archivist Meetings: More than 4 times per year  . Marital Status: Married  Human resources officer Violence: Not At Risk  . Fear of Current or Ex-Partner: No  . Emotionally  Abused: No  . Physically Abused: No  . Sexually Abused: No   Family Status  Relation Name Status  . Mother  Deceased  . Father  Deceased  . Sister  Alive  . Brother  Alive  . Other  (Not Specified)  . Neg Hx  (Not Specified)   Family History  Problem Relation Age of Onset  . Hypertension Mother   . Heart disease Father   . Hypertension Sister   . Hypertension Brother   . Heart disease Other   . Prostate cancer Neg Hx   . Kidney cancer Neg Hx   . Colon cancer Neg Hx   . Pancreatic cancer Neg Hx   . Breast cancer Neg Hx    Allergies  Allergen Reactions  . Donepezil Nausea Only    Sick feeling  . Penicillins     Patient Care Team: Jerrol Banana., MD as PCP - General (Family Medicine) Bary Castilla, Forest Gleason, MD (General Surgery) Minna Merritts, MD as Consulting Physician (Cardiology) Vladimir Crofts, MD as Consulting Physician (Neurology) Alexis Frock, MD as Consulting Physician (Urology) Tyler Pita, MD as Consulting Physician (Radiation Oncology) Oneta Rack, MD (Dermatology)   Medications: Outpatient Medications Prior to Visit  Medication Sig  . amLODipine (NORVASC) 10 MG tablet Take 1 tablet (10 mg total) by mouth daily. PLEASE SCHEDULE APPOINTMENT FOR FURTHER REFILLS.  Marland Kitchen aspirin 81 MG EC tablet Take 81 mg by mouth daily.   Marland Kitchen atorvastatin (LIPITOR) 80 MG tablet TAKE ONE TABLET BY MOUTH EVERY EVENING AT 6PM  . benazepril (LOTENSIN) 40 MG tablet TAKE 1 TABLET BY MOUTH EVERY DAY  . carbidopa-levodopa (SINEMET IR) 25-250 MG tablet Take 1 tablet by mouth 3 (three) times daily.   . carbidopa-levodopa-entacapone (STALEVO) 50-200-200 MG tablet Take 1 tablet by mouth at bedtime.  . Omega-3 Fatty Acids (OMEGA 3 PO) Take 1,400 mg by mouth daily.  . tamsulosin (FLOMAX) 0.4 MG CAPS capsule Take 1 capsule (0.4 mg total) by mouth daily after supper.  . Amantadine HCl 100 MG tablet Take 50 mg by mouth 2 (two) times daily.  . Vitamin D, Ergocalciferol,  (DRISDOL) 1.25 MG (50000 UT) CAPS capsule Take 1 capsule (50,000 Units total) by mouth every 7 (seven) days.   No facility-administered medications prior to visit.    Review of Systems  All other systems reviewed and are negative.      Objective    BP 122/68 (BP Location: Left Arm)   Pulse 64   Temp 98.2 F (36.8 C)   Resp 16   Ht 5\' 8"  (1.727 m)   Wt 168 lb (76.2 kg)   BMI 25.54 kg/m     Physical Exam Vitals reviewed.  Constitutional:      Appearance: He is well-developed.  HENT:     Head: Normocephalic and atraumatic.     Right Ear: External ear normal.     Left Ear: External ear normal.     Nose: Nose normal.  Eyes:     General: No scleral icterus.    Conjunctiva/sclera: Conjunctivae normal.     Pupils: Pupils are equal, round, and reactive to light.  Neck:     Thyroid: No thyromegaly.  Cardiovascular:     Rate and Rhythm: Normal rate and regular rhythm.     Heart sounds: Normal heart sounds.  Pulmonary:     Effort: Pulmonary effort is normal.     Breath sounds: Normal breath sounds.  Abdominal:     Palpations: Abdomen is soft.  Lymphadenopathy:     Cervical: No cervical adenopathy.  Skin:    General: Skin is warm and dry.     Comments: SKs of face.  Neurological:     Mental Status: He is alert and oriented to person, place, and time. Mental status is at baseline.     Comments: Parkinson stigmata.  Psychiatric:        Mood and Affect: Mood normal.        Behavior: Behavior normal.        Thought Content: Thought content normal.        Judgment: Judgment normal.        Last depression screening scores PHQ 2/9 Scores 12/30/2019 04/09/2019 12/23/2018  PHQ - 2 Score 0 0 0   Last fall risk screening Fall Risk  12/30/2019  Falls in the past year? 1  Number falls in past yr: 1  Injury with Fall? 0  Risk for fall due to : -  Risk for fall due to: Comment -  Follow up Falls prevention discussed   Last Audit-C alcohol use screening Alcohol Use  Disorder Test (AUDIT) 12/30/2019  1. How often do you have a drink containing alcohol? 4  2. How many drinks containing alcohol do you have on a typical day when you are drinking? 1  3. How often do you have six or more drinks on one occasion? 0  AUDIT-C Score 5  4. How often during the last year have you found that you were not able to stop drinking once you had started? 0  5. How often during the last year have you failed to do what was normally expected from you because of drinking? 0  6. How often during the last year have you needed a first drink in the morning to get yourself going after a heavy drinking session? 0  7. How often during the last year have you had a feeling of guilt of remorse after drinking? 0  8. How often during the last year have you been unable to remember what happened the night before because you had been drinking? 0  9. Have you or someone else been injured as a result of your drinking? 0  10. Has a relative or friend or a doctor or another health worker been concerned about your drinking or suggested you cut down? 0  Alcohol Use Disorder Identification Test Final Score (AUDIT) 5   A score of 3 or more in women, and 4 or more in men indicates increased risk for alcohol abuse, EXCEPT if all of the points are from question 1   No results found for any visits on 10/17/20.  Assessment & Plan  Routine Health Maintenance and Physical Exam  Exercise Activities and Dietary recommendations Goals    . Increase water intake     Recommend to increase fluid intake to 6-8 glasses per day       Immunization History  Administered Date(s) Administered  . Fluad Quad(high Dose 65+) 10/06/2019  . Influenza, High Dose Seasonal PF 10/07/2018  . Pneumococcal Conjugate-13 04/24/2016  . Pneumococcal Polysaccharide-23 10/07/2018  . Tdap 04/24/2016    Health Maintenance  Topic Date Due  . Hepatitis C Screening  Never done  . INFLUENZA VACCINE  07/17/2020  . COLONOSCOPY   02/11/2022  . TETANUS/TDAP  04/24/2026  . COVID-19 Vaccine  Completed  . PNA vac Low Risk Adult  Completed    Discussed health benefits of physical activity, and encouraged him to engage in regular exercise appropriate for his age and condition. 1. Annual physical exam   2. Mixed hyperlipidemia  - Lipid panel  3. Essential hypertension  - TSH - CBC w/Diff/Platelet - Comprehensive Metabolic Panel (CMET)  4. Prostate cancer [facility]) Followed by urology  5. PARKINSON'S DISEASE   6. Constipation, unspecified constipation type Try GlycoLax daily as he has a bowel movement about every 4 days.  This is a chronic issue.   No follow-ups on file.         Arelyn Gauer Cranford Mon, MD  [facility] [phone] (phone) [phone] (fax)  [place]

## 2020-10-17 ENCOUNTER — Ambulatory Visit (INDEPENDENT_AMBULATORY_CARE_PROVIDER_SITE_OTHER): Payer: Medicare HMO | Admitting: Family Medicine

## 2020-10-17 ENCOUNTER — Other Ambulatory Visit: Payer: Self-pay

## 2020-10-17 ENCOUNTER — Encounter: Payer: Self-pay | Admitting: Family Medicine

## 2020-10-17 VITALS — BP 122/68 | HR 64 | Temp 98.2°F | Resp 16 | Ht 68.0 in | Wt 168.0 lb

## 2020-10-17 DIAGNOSIS — I10 Essential (primary) hypertension: Secondary | ICD-10-CM

## 2020-10-17 DIAGNOSIS — C61 Malignant neoplasm of prostate: Secondary | ICD-10-CM

## 2020-10-17 DIAGNOSIS — K5900 Constipation, unspecified: Secondary | ICD-10-CM

## 2020-10-17 DIAGNOSIS — Z0000 Encounter for general adult medical examination without abnormal findings: Secondary | ICD-10-CM

## 2020-10-17 DIAGNOSIS — G20 Parkinson's disease: Secondary | ICD-10-CM

## 2020-10-17 DIAGNOSIS — E782 Mixed hyperlipidemia: Secondary | ICD-10-CM

## 2020-10-17 NOTE — Patient Instructions (Signed)
Try Glycolax for constipation.

## 2020-10-19 ENCOUNTER — Other Ambulatory Visit: Payer: Self-pay

## 2020-10-19 ENCOUNTER — Encounter: Payer: Self-pay | Admitting: Cardiovascular Disease

## 2020-10-19 ENCOUNTER — Ambulatory Visit: Payer: Medicare HMO | Admitting: Cardiovascular Disease

## 2020-10-19 VITALS — BP 120/84 | HR 59 | Ht 68.0 in | Wt 164.4 lb

## 2020-10-19 DIAGNOSIS — E782 Mixed hyperlipidemia: Secondary | ICD-10-CM

## 2020-10-19 DIAGNOSIS — I25118 Atherosclerotic heart disease of native coronary artery with other forms of angina pectoris: Secondary | ICD-10-CM | POA: Diagnosis not present

## 2020-10-19 DIAGNOSIS — I10 Essential (primary) hypertension: Secondary | ICD-10-CM

## 2020-10-19 DIAGNOSIS — I6529 Occlusion and stenosis of unspecified carotid artery: Secondary | ICD-10-CM

## 2020-10-19 DIAGNOSIS — I739 Peripheral vascular disease, unspecified: Secondary | ICD-10-CM | POA: Diagnosis not present

## 2020-10-19 DIAGNOSIS — I350 Nonrheumatic aortic (valve) stenosis: Secondary | ICD-10-CM

## 2020-10-19 MED ORDER — ATORVASTATIN CALCIUM 80 MG PO TABS
ORAL_TABLET | ORAL | 3 refills | Status: DC
Start: 2020-10-19 — End: 2021-12-01

## 2020-10-19 MED ORDER — AMLODIPINE BESYLATE 10 MG PO TABS
10.0000 mg | ORAL_TABLET | Freq: Every day | ORAL | 3 refills | Status: DC
Start: 2020-10-19 — End: 2021-06-05

## 2020-10-19 MED ORDER — BENAZEPRIL HCL 40 MG PO TABS
40.0000 mg | ORAL_TABLET | Freq: Every day | ORAL | 3 refills | Status: DC
Start: 2020-10-19 — End: 2021-12-07

## 2020-10-19 NOTE — Progress Notes (Signed)
Cardiology Office Note  Date:  10/19/2020   ID:  [name], DOB 12-Feb-1946, MRN [id number]  PCP:  Jerrol Banana., MD   Chief Complaint  Patient presents with  . Follow-up    12 month follow up. Meds reviewed by the pt. verbally. "doing well."     HPI:  [name] [name] is a pleasant 74 year old gentleman with a history of  bilateral mild carotid disease,  prior MI in 1994 with disease in the distal RCA with medical management  stress test in January 2012 with fixed inferior wall defect,   history of Parkinson's Presents for routine followup of his coronary artery disease and PAD  Last seen in clinic August 2020  Recent studies reviewed Echo 07/2019 normal cardiac function Aortic valve is heavily calcified with mild stenosis Mean gradient 10  Exercises daily  treadmill  6 days a week, 30 min -45 min No anginal symptoms "good days and not so good", "I walk through it"  Gets up 3-4 x a night to urinate High fluid intake  PSA previously elevated 9.9, diagnosed with prostate cancer completed XRT, 5 weeks Has follow-up with urology  Sees Dr. Shah,neurology parkinsons stable Balance stable Tremor from his Parkinson's ,  stable  Feels the exercises helping  No chest pain or shortness of breath with exertion concerning for angina  EKG personally reviewed by myself on todays visit  shows normal sinus rhythm with rate 59 bpm,  Old inferior MI   PMH:   has a past medical history of Acute myocardial infarction of other inferior wall, episode of care unspecified (May 1994), Cancer [facility]), Coronary atherosclerosis of native coronary artery, History of shingles, Occlusion and stenosis of carotid artery without mention of cerebral infarction, Other and unspecified hyperlipidemia, Parkinson's disease, Peripheral vascular disease, unspecified ([place]), Precancerous skin lesion, Prostate cancer ([place]) (10/2016), Shingles (August 2015), Squamous cell cancer of skin of nose,  Unspecified cerebral artery occlusion with cerebral infarction (2006), and Unspecified essential hypertension.  PSH:    Past Surgical History:  Procedure Laterality Date  . BIOPSY PROSTATE  11/12/2017  . COLONOSCOPY WITH PROPOFOL N/A 02/11/2017   Procedure: COLONOSCOPY WITH PROPOFOL;  Surgeon: Manya Silvas, MD;  Location[facility] ENDOSCOPY;  Service: Endoscopy;  Laterality: N/A;  . HERNIA REPAIR  02/17/15   right inguinal hernia , laparoscopic placement of Bard 3-D max mesh  . HERNIA REPAIR  3/316   ventral hernia, umbilical, open repair  . MOHS SURGERY    . SKIN BIOPSY     skin cancer  . TRANSRECTAL ULTRASOUND  11/12/2017    Current Outpatient Medications  Medication Sig Dispense Refill  . Amantadine HCl 100 MG tablet Take 50 mg by mouth 2 (two) times daily.    Marland Kitchen amLODipine (NORVASC) 10 MG tablet Take 1 tablet (10 mg total) by mouth daily. 90 tablet 3  . aspirin 81 MG EC tablet Take 81 mg by mouth daily.     Marland Kitchen atorvastatin (LIPITOR) 80 MG tablet Take 1 tablet (80 mg) by mouth once daily 90 tablet 3  . B Complex Vitamins (VITAMIN B COMPLEX PO) Take by mouth daily.    . benazepril (LOTENSIN) 40 MG tablet Take 1 tablet (40 mg total) by mouth daily. 90 tablet 3  . carbidopa-levodopa (SINEMET IR) 25-250 MG tablet Take 2 tablets by mouth 3 (three) times daily.     . carbidopa-levodopa-entacapone (STALEVO) 50-200-200 MG tablet Take 1 tablet by mouth in the morning and at bedtime.     Marland Kitchen  Omega-3 Fatty Acids (OMEGA 3 PO) Take 1,400 mg by mouth daily.    . tamsulosin (FLOMAX) 0.4 MG CAPS capsule Take 1 capsule (0.4 mg total) by mouth daily after supper. 30 capsule 5  . Vitamin D, Ergocalciferol, (DRISDOL) 1.25 MG (50000 UT) CAPS capsule Take 1 capsule (50,000 Units total) by mouth every 7 (seven) days. 4 capsule 12   No current facility-administered medications for this visit.     Allergies:   Donepezil and Penicillins   Social History:  The patient  reports that he has never smoked. He has  never used smokeless tobacco. He reports current alcohol use of about 21.0 standard drinks of alcohol per week. He reports that he does not use drugs.   Family History:   family history includes Heart disease in his father and another family member; Hypertension in his brother, mother, and sister.    Review of Systems: Review of Systems  Constitutional: Negative.   Respiratory: Negative.   Cardiovascular: Negative.   Gastrointestinal: Negative.   Musculoskeletal: Negative.   Neurological: Positive for tremors.  Psychiatric/Behavioral: Positive for memory loss.  All other systems reviewed and are negative.   PHYSICAL EXAM: VS:  BP 120/84 (BP Location: Left Arm, Patient Position: Sitting, Cuff Size: Normal)   Pulse (!) 59   Ht 5\' 8"  (1.727 m)   Wt 164 lb 6 oz (74.6 kg)   SpO2 98%   BMI 24.99 kg/m  , BMI Body mass index is 24.99 kg/m. Constitutional:  oriented to person, place, and time. No distress.  HENT:  Head: Grossly normal Eyes:  no discharge. No scleral icterus.  Neck: No JVD, no carotid bruits  Cardiovascular: Regular rate and rhythm, 0-9/8 systolic ejection murmur right sternal border Pulmonary/Chest: Clear to auscultation bilaterally, no wheezes or rails Abdominal: Soft.  no distension.  no tenderness.  Musculoskeletal: Normal range of motion Neurological:  normal muscle tone. Coordination normal. No atrophy Skin: Skin warm and dry Psychiatric: normal affect, pleasant   Recent Labs: No results found for requested labs within last 8760 hours.    Lipid Panel Lab Results  Component Value Date   CHOL 170 07/28/2019   HDL 62 07/28/2019   LDLCALC 95 07/28/2019   TRIG 65 07/28/2019      Wt Readings from Last 3 Encounters:  10/19/20 164 lb 6 oz (74.6 kg)  10/17/20 168 lb (76.2 kg)  01/21/20 171 lb 9.6 oz (77.8 kg)       ASSESSMENT AND PLAN:  Atherosclerosis of native coronary artery of native heart with stable angina pectoris (HCC) Currently with no  symptoms of angina. No further workup at this time. Continue current medication regimen. Continue exercise program  Essential hypertension Blood pressure is well controlled on today's visit. No changes made to the medications.  Mixed hyperlipidemia Weight mildly elevated, has new lab work pending May improve with recent weight loss over the past year  Bilateral carotid artery stenosis  carotid ultrasound 10/2018, less than 39% disease bilaterally, no repeat needed at this time.  Consider next year  PARKINSON'S DISEASE memory loss, tremor, balance , stable Followed by neurology Doing much better with his exercise on a regular basis  Aortic valve stenosis Very mild disease, minimal murmur on exam    Total encounter time more than 25 minutes  Greater than 50% was spent in counseling and coordination of care with the patient    Orders Placed This Encounter  Procedures  . EKG 12-Lead     Signed, Esmond Plants,  M.D., Ph.D. 10/19/2020  [place], [place]

## 2020-10-19 NOTE — Patient Instructions (Addendum)
Medication  90 days  , refills  Medication Instructions:  No changes  If you need a refill on your cardiac medications before your next appointment, please call your pharmacy.    Lab work: No new labs needed   If you have labs (blood work) drawn today and your tests are completely normal, you will receive your results only by: Marland Kitchen MyChart Message (if you have MyChart) OR . A paper copy in the mail If you have any lab test that is abnormal or we need to change your treatment, we will call you to review the results.   Testing/Procedures: No new testing needed   Follow-Up: At [facility], you and your health needs are our priority.  As part of our continuing mission to provide you with exceptional heart care, we have created designated Provider Care Teams.  These Care Teams include your primary Cardiologist (physician) and Advanced Practice Providers (APPs -  Physician Assistants and Nurse Practitioners) who all work together to provide you with the care you need, when you need it.  . You will need a follow up appointment in 12 months  . Providers on your designated Care Team:   . Murray Hodgkins, NP . Christell Faith, PA-C . Marrianne Mood, PA-C  Any Other Special Instructions Will Be Listed Below (If Applicable).  COVID-19 Vaccine Information can be found at: ShippingScam.co.uk For questions related to vaccine distribution or appointments, please email [email or web address] .[email or web address] or call [phone].

## 2020-11-02 DIAGNOSIS — E782 Mixed hyperlipidemia: Secondary | ICD-10-CM | POA: Diagnosis not present

## 2020-11-02 DIAGNOSIS — I10 Essential (primary) hypertension: Secondary | ICD-10-CM | POA: Diagnosis not present

## 2020-11-03 LAB — COMPREHENSIVE METABOLIC PANEL
ALT: 17 IU/L (ref 0–44)
AST: 18 IU/L (ref 0–40)
Albumin/Globulin Ratio: 1.8 (ref 1.2–2.2)
Albumin: 4.8 g/dL — ABNORMAL HIGH (ref 3.7–4.7)
Alkaline Phosphatase: 59 IU/L (ref 44–121)
BUN/Creatinine Ratio: 24 (ref 10–24)
BUN: 13 mg/dL (ref 8–27)
Bilirubin Total: 1.3 mg/dL — ABNORMAL HIGH (ref 0.0–1.2)
CO2: 22 mmol/L (ref 20–29)
Calcium: 9.5 mg/dL (ref 8.6–10.2)
Chloride: 100 mmol/L (ref 96–106)
Creatinine, Ser: 0.55 mg/dL — ABNORMAL LOW (ref 0.76–1.27)
GFR calc Af Amer: 119 mL/min/{1.73_m2} (ref >59)
GFR calc non Af Amer: 103 mL/min/{1.73_m2} (ref >59)
Globulin, Total: 2.7 g/dL (ref 1.5–4.5)
Glucose: 107 mg/dL — ABNORMAL HIGH (ref 65–99)
Potassium: 4.1 mmol/L (ref 3.5–5.2)
Sodium: 138 mmol/L (ref 134–144)
Total Protein: 7.5 g/dL (ref 6.0–8.5)

## 2020-11-03 LAB — LIPID PANEL
Chol/HDL Ratio: 2.5 ratio (ref 0.0–5.0)
Cholesterol, Total: 186 mg/dL (ref 100–199)
HDL: 73 mg/dL (ref >39)
LDL Chol Calc (NIH): 101 mg/dL — ABNORMAL HIGH (ref 0–99)
Triglycerides: 63 mg/dL (ref 0–149)
VLDL Cholesterol Cal: 12 mg/dL (ref 5–40)

## 2020-11-03 LAB — CBC WITH DIFFERENTIAL/PLATELET
Basophils Absolute: 0 10*3/uL (ref 0.0–0.2)
Basos: 0 %
EOS (ABSOLUTE): 0 10*3/uL (ref 0.0–0.4)
Eos: 0 %
Hematocrit: 45.4 % (ref 37.5–51.0)
Hemoglobin: 15 g/dL (ref 13.0–17.7)
Immature Grans (Abs): 0 10*3/uL (ref 0.0–0.1)
Immature Granulocytes: 0 %
Lymphocytes Absolute: 1.6 10*3/uL (ref 0.7–3.1)
Lymphs: 30 %
MCH: 31.4 pg (ref 26.6–33.0)
MCHC: 33 g/dL (ref 31.5–35.7)
MCV: 95 fL (ref 79–97)
Monocytes Absolute: 0.5 10*3/uL (ref 0.1–0.9)
Monocytes: 10 %
Neutrophils Absolute: 3.2 10*3/uL (ref 1.4–7.0)
Neutrophils: 60 %
Platelets: 225 10*3/uL (ref 150–450)
RBC: 4.78 x10E6/uL (ref 4.14–5.80)
RDW: 11.8 % (ref 11.6–15.4)
WBC: 5.3 10*3/uL (ref 3.4–10.8)

## 2020-11-03 LAB — TSH: TSH: 0.816 u[IU]/mL (ref 0.450–4.500)

## 2020-11-08 ENCOUNTER — Ambulatory Visit: Payer: Self-pay | Admitting: *Deleted

## 2020-11-08 NOTE — Telephone Encounter (Signed)
Pt given lab results per notes of Dr. Rosanna Randy on 11/08/20. Pt verbalized understanding.

## 2020-11-25 DIAGNOSIS — L570 Actinic keratosis: Secondary | ICD-10-CM | POA: Diagnosis not present

## 2020-11-25 DIAGNOSIS — Z85828 Personal history of other malignant neoplasm of skin: Secondary | ICD-10-CM | POA: Diagnosis not present

## 2020-11-25 DIAGNOSIS — L821 Other seborrheic keratosis: Secondary | ICD-10-CM | POA: Diagnosis not present

## 2020-11-25 DIAGNOSIS — D0439 Carcinoma in situ of skin of other parts of face: Secondary | ICD-10-CM | POA: Diagnosis not present

## 2020-11-25 DIAGNOSIS — D225 Melanocytic nevi of trunk: Secondary | ICD-10-CM | POA: Diagnosis not present

## 2020-11-25 DIAGNOSIS — D485 Neoplasm of uncertain behavior of skin: Secondary | ICD-10-CM | POA: Diagnosis not present

## 2020-11-25 DIAGNOSIS — X32XXXA Exposure to sunlight, initial encounter: Secondary | ICD-10-CM | POA: Diagnosis not present

## 2020-11-25 DIAGNOSIS — Z08 Encounter for follow-up examination after completed treatment for malignant neoplasm: Secondary | ICD-10-CM | POA: Diagnosis not present

## 2020-11-25 DIAGNOSIS — Z85820 Personal history of malignant melanoma of skin: Secondary | ICD-10-CM | POA: Diagnosis not present

## 2020-12-29 ENCOUNTER — Telehealth: Payer: Self-pay | Admitting: Family Medicine

## 2020-12-29 NOTE — Telephone Encounter (Signed)
Copied from [place] [phone]. Topic: Medicare AWV >> Dec 29, 2020 10:27 AM Cher Nakai R wrote: Reason for CRM:   Left message for patient to call back and schedule Medicare Annual Wellness Visit (AWV) in office.   If not able to come in office, please offer to do virtually.   Last AWV 12/30/2019  Please schedule at anytime with [facility] Health Advisor.  If any questions, please contact me at [phone]

## 2021-01-04 NOTE — Progress Notes (Signed)
Subjective:   [name] Fish. is a 75 y.o. male who presents for Medicare Annual/Subsequent preventive examination.  I connected with Londell Moh today by telephone and verified that I am speaking with the correct person using two identifiers. Location patient: home Location provider: work Persons participating in the virtual visit: patient, provider.   I discussed the limitations, risks, security and privacy concerns of performing an evaluation and management service by telephone and the availability of in person appointments. I also discussed with the patient that there may be a patient responsible charge related to this service. The patient expressed understanding and verbally consented to this telephonic visit.    Interactive audio and video telecommunications were attempted between this provider and patient, however failed, due to patient having technical difficulties OR patient did not have access to video capability.  We continued and completed visit with audio only.   Review of Systems    N/A        Objective:    Today's Vitals   01/05/21 0944  PainSc: 4    There is no height or weight on file to calculate BMI.  Advanced Directives 01/05/2021 12/30/2019 12/23/2018 11/06/2018 12/25/2017 09/19/2017 02/11/2017  Does Patient Have a Medical Advance Directive? Yes Yes No No No No No  Type of Paramedic of [employer];Living will [place];Living will - - - - -  Copy of [place] in Chart? No - copy requested No - copy requested - - - - -  Would patient like information on creating a medical advance directive? - - Yes (MAU/Ambulatory/Procedural Areas - Information given) No - Patient declined No - Patient declined No - Patient declined No - Patient declined    Current Medications (verified) Outpatient Encounter Medications as of 01/05/2021  Medication Sig  . amLODipine (NORVASC) 10 MG tablet Take 1 tablet (10 mg total) by  mouth daily.  Marland Kitchen aspirin 81 MG EC tablet Take 81 mg by mouth daily.   Marland Kitchen atorvastatin (LIPITOR) 80 MG tablet Take 1 tablet (80 mg) by mouth once daily  . benazepril (LOTENSIN) 40 MG tablet Take 1 tablet (40 mg total) by mouth daily.  . carbidopa-levodopa (SINEMET IR) 25-100 MG tablet Take 2 tablets by mouth 3 (three) times daily.  . carbidopa-levodopa-entacapone (STALEVO) 50-200-200 MG tablet Take 1 tablet by mouth in the morning and at bedtime.   . Cyanocobalamin (VITAMIN B-12) 2500 MCG SUBL Place under the tongue daily.  . Omega-3 Fatty Acids (OMEGA 3 PO) Take 1,400 mg by mouth daily.  . Vitamin D, Ergocalciferol, (DRISDOL) 1.25 MG (50000 UT) CAPS capsule Take 1 capsule (50,000 Units total) by mouth every 7 (seven) days.  . Amantadine HCl 100 MG tablet Take 50 mg by mouth 2 (two) times daily. (Patient not taking: Reported on 01/05/2021)  . B Complex Vitamins (VITAMIN B COMPLEX PO) Take by mouth daily. (Patient not taking: Reported on 01/05/2021)  . carbidopa-levodopa (SINEMET IR) 25-250 MG tablet Take 2 tablets by mouth 3 (three) times daily.  (Patient not taking: Reported on 01/05/2021)  . tamsulosin (FLOMAX) 0.4 MG CAPS capsule Take 1 capsule (0.4 mg total) by mouth daily after supper. (Patient not taking: Reported on 01/05/2021)   No facility-administered encounter medications on file as of 01/05/2021.    Allergies (verified) Donepezil and Penicillins   History: Past Medical History:  Diagnosis Date  . Acute myocardial infarction of other inferior wall, episode of care unspecified May 1994  . Cancer ([place])  skin / SCC  . Coronary atherosclerosis of native coronary artery   . History of shingles    waist area/right back side  . Occlusion and stenosis of carotid artery without mention of cerebral infarction   . Other and unspecified hyperlipidemia    Severe  . Parkinson's disease   . Peripheral vascular disease, unspecified ([place])   . Precancerous skin lesion   . Prostate cancer ([place])  10/2016   low risk  . Shingles August 2015  . Squamous cell cancer of skin of nose   . Unspecified cerebral artery occlusion with cerebral infarction 2006   CVA  . Unspecified essential hypertension    Past Surgical History:  Procedure Laterality Date  . BIOPSY PROSTATE  11/12/2017  . COLONOSCOPY WITH PROPOFOL N/A 02/11/2017   Procedure: COLONOSCOPY WITH PROPOFOL;  Surgeon: Manya Silvas, MD;  Location: [facility] ENDOSCOPY;  Service: Endoscopy;  Laterality: N/A;  . HERNIA REPAIR  02/17/15   right inguinal hernia , laparoscopic placement of [place] 3-D max mesh  . HERNIA REPAIR  3/316   ventral hernia, umbilical, open repair  . MOHS SURGERY    . SKIN BIOPSY     skin cancer  . TRANSRECTAL ULTRASOUND  11/12/2017   Family History  Problem Relation Age of Onset  . Hypertension Mother   . Heart disease Father   . Hypertension Sister   . Hypertension Brother   . Heart disease Other   . Prostate cancer Neg Hx   . Kidney cancer Neg Hx   . Colon cancer Neg Hx   . Pancreatic cancer Neg Hx   . Breast cancer Neg Hx    Social History   Socioeconomic History  . Marital status: Married    Spouse name: Not on file  . Number of children: 3  . Years of education: Not on file  . Highest education level: Bachelor's degree (e.g., BA, AB, BS)  Occupational History  . Occupation: GOLF TEACHER    Employer: [employer]    Comment: retired  Tobacco Use  . Smoking status: Never Smoker  . Smokeless tobacco: Never Used  Vaping Use  . Vaping Use: Never used  Substance and Sexual Activity  . Alcohol use: Yes    Alcohol/week: 21.0 standard drinks    Types: 21 Glasses of wine per week    Comment: 3 glasses of wine/day  . Drug use: No  . Sexual activity: Not Currently  Other Topics Concern  . Not on file  Social History Narrative   Married   Gets regular exercise   Social Determinants of Health   Financial Resource Strain: Low Risk   . Difficulty of Paying Living Expenses: Not hard  at all  Food Insecurity: No Food Insecurity  . Worried About Charity fundraiser in the Last Year: Never true  . Ran Out of Food in the Last Year: Never true  Transportation Needs: No Transportation Needs  . Lack of Transportation (Medical): No  . Lack of Transportation (Non-Medical): No  Physical Activity: Sufficiently Active  . Days of Exercise per Week: 6 days  . Minutes of Exercise per Session: 30 min  Stress: No Stress Concern Present  . Feeling of Stress : Not at all  Social Connections: Socially Integrated  . Frequency of Communication with Friends and Family: Three times a week  . Frequency of Social Gatherings with Friends and Family: More than three times a week  . Attends Religious Services: More than 4 times per year  .  Active Member of Clubs or Organizations: Yes  . Attends Archivist Meetings: More than 4 times per year  . Marital Status: Married    Tobacco Counseling Counseling given: Not Answered   Clinical Intake:  Pre-visit preparation completed: Yes  Pain : 0-10 Pain Score: 4  Pain Type: Chronic pain Pain Location: Back Pain Orientation: Lower Pain Descriptors / Indicators: Aching Pain Frequency: Intermittent Pain Relieving Factors: None.  Pain Relieving Factors: None.  Nutritional Risks: None Diabetes: No  How often do you need to have someone help you when you read instructions, pamphlets, or other written materials from your doctor or pharmacy?: 1 - Never  Diabetic? No  Interpreter Needed?: No  Information entered by :: [facility], LPN   Activities of Daily Living In your present state of health, do you have any difficulty performing the following activities: 01/05/2021  Hearing? N  Vision? N  Difficulty concentrating or making decisions? N  Walking or climbing stairs? N  Dressing or bathing? N  Doing errands, shopping? N  Preparing Food and eating ? N  Using the Toilet? N  In the past six months, have you accidently leaked  urine? Y  Comment Due to BPH.  Do you have problems with loss of bowel control? N  Managing your Medications? N  Managing your Finances? N  Housekeeping or managing your Housekeeping? N  Some recent data might be hidden    Patient Care Team: Jerrol Banana., MD as PCP - General (Family Medicine) Rockey Situ, Kathlene November, MD as Consulting Physician (Cardiology) Vladimir Crofts, MD as Consulting Physician (Neurology) Alexis Frock, MD as Consulting Physician (Urology) Tyler Pita, MD as Consulting Physician (Radiation Oncology) Oneta Rack, MD (Dermatology) Leandrew Koyanagi, MD as Referring Physician (Ophthalmology)  Indicate any recent Medical Services you may have received from other than [facility] providers in the past year (date may be approximate).     Assessment:   This is a routine wellness examination for [name] [name].  Hearing/Vision screen No exam data present  Dietary issues and exercise activities discussed: Current Exercise Habits: Structured exercise class, Type of exercise: treadmill;walking, Time (Minutes): 40, Frequency (Times/Week): 6, Weekly Exercise (Minutes/Week): 240, Exercise limited by: None identified  Goals    . Increase water intake     Recommend to increase fluid intake to 6-8 glasses per day.    Marland Kitchen LIFESTYLE - DECREASE FALLS RISK     Recommend to remove any items from the home that may cause slips or trips.      Depression Screen PHQ 2/9 Scores 01/05/2021 12/30/2019 04/09/2019 12/23/2018 06/09/2018 09/19/2017 09/18/2016  PHQ - 2 Score 0 0 0 0 0 0 0    Fall Risk Fall Risk  01/05/2021 12/30/2019 04/09/2019 12/23/2018 06/09/2018  Falls in the past year? 1 1 0 0 No  Number falls in past yr: 0 1 - 0 -  Injury with Fall? 0 0 - 0 -  Risk for fall due to : - - - - -  Risk for fall due to: Comment - - - - -  Follow up - Falls prevention discussed - - -    FALL RISK PREVENTION PERTAINING TO THE HOME:  Any stairs in or around the home? Yes  If so, are there  any without handrails? No  Home free of loose throw rugs in walkways, pet beds, electrical cords, etc? Yes  Adequate lighting in your home to reduce risk of falls? Yes   ASSISTIVE DEVICES UTILIZED TO PREVENT FALLS:  Life alert? No  Use of a cane, walker or w/c? No  Grab bars in the bathroom? Yes  Shower chair or bench in shower? No  Elevated toilet seat or a handicapped toilet? No    Cognitive Function: Normal cognitive status assessed by observation by this Nurse Health Advisor. No abnormalities found.   MMSE - Mini Mental State Exam 10/06/2019 09/26/2017  Orientation to time 5 5  Orientation to Place 5 5  Registration 3 3  Attention/ Calculation 5 5  Recall 3 3  Language- name 2 objects 2 2  Language- repeat 1 1  Language- follow 3 step command 3 3  Language- read & follow direction 1 1  Write a sentence 1 1  Copy design 1 1  Total score 30 30     6CIT Screen 12/23/2018 09/18/2016  What Year? 0 points 0 points  What month? 0 points 0 points  What time? 0 points 3 points  Count back from 20 0 points 0 points  Months in reverse 0 points 0 points  Repeat phrase 0 points 0 points  Total Score 0 3    Immunizations Immunization History  Administered Date(s) Administered  . Fluad Quad(high Dose 65+) 10/06/2019  . Influenza, High Dose Seasonal PF 10/07/2018  . Moderna Sars-Covid-2 Vaccination 12/24/2019, 02/05/2020, 12/27/2020  . Pneumococcal Conjugate-13 04/24/2016  . Pneumococcal Polysaccharide-23 10/07/2018  . Tdap 04/24/2016    TDAP status: Up to date  Flu Vaccine status: Declined, Education has been provided regarding the importance of this vaccine but patient still declined. Advised may receive this vaccine at local pharmacy or Health Dept. Aware to provide a copy of the vaccination record if obtained from local pharmacy or Health Dept. Verbalized acceptance and understanding.  Pneumococcal vaccine status: Up to date  Covid-19 vaccine status: Completed  vaccines  Qualifies for Shingles Vaccine? Yes   Zostavax completed No   Shingrix Completed?: No.    Education has been provided regarding the importance of this vaccine. Patient has been advised to call insurance company to determine out of pocket expense if they have not yet received this vaccine. Advised may also receive vaccine at local pharmacy or Health Dept. Verbalized acceptance and understanding.  Screening Tests Health Maintenance  Topic Date Due  . Hepatitis C Screening  Never done  . INFLUENZA VACCINE  03/16/2021 (Originally 07/17/2020)  . COVID-19 Vaccine (4 - Booster for Moderna series) 06/26/2021  . COLONOSCOPY (Pts 45-55yrs Insurance coverage will need to be confirmed)  02/11/2022  . TETANUS/TDAP  04/24/2026  . PNA vac Low Risk Adult  Completed    Health Maintenance  Health Maintenance Due  Topic Date Due  . Hepatitis C Screening  Never done    Colorectal cancer screening: Type of screening: Colonoscopy. Completed 02/11/17. Repeat every 5 years  Lung Cancer Screening: (Low Dose CT Chest recommended if Age 66-80 years, 30 pack-year currently smoking OR have quit w/in 15years.) does not qualify.   Additional Screening:  Hepatitis C Screening: does qualify however declines order.  Vision Screening: Recommended annual ophthalmology exams for early detection of glaucoma and other disorders of the eye. Is the patient up to date with their annual eye exam?  Yes  Who is the provider or what is the name of the office in which the patient attends annual eye exams? Dr Wallace Going @ [place] If pt is not established with a provider, would they like to be referred to a provider to establish care? No .   Dental Screening: Recommended  annual dental exams for proper oral hygiene  Community Resource Referral / Chronic Care Management: CRR required this visit?  No   CCM required this visit?  No      Plan:     I have personally reviewed and noted the following in the patient's  chart:   . Medical and social history . Use of alcohol, tobacco or illicit drugs  . Current medications and supplements . Functional ability and status . Nutritional status . Physical activity . Advanced directives . List of other physicians . Hospitalizations, surgeries, and ER visits in previous 12 months . Vitals . Screenings to include cognitive, depression, and falls . Referrals and appointments  In addition, I have reviewed and discussed with patient certain preventive protocols, quality metrics, and best practice recommendations. A written personalized care plan for preventive services as well as general preventive health recommendations were provided to patient.     Macario Shear [place], [place]   [id number]   Nurse Notes: Pt declined a future flu vaccine or completing a Hep C lab.

## 2021-01-05 ENCOUNTER — Other Ambulatory Visit: Payer: Self-pay

## 2021-01-05 ENCOUNTER — Ambulatory Visit (INDEPENDENT_AMBULATORY_CARE_PROVIDER_SITE_OTHER): Payer: Medicare HMO

## 2021-01-05 DIAGNOSIS — Z0000 Encounter for general adult medical examination without abnormal findings: Secondary | ICD-10-CM | POA: Diagnosis not present

## 2021-01-05 NOTE — Patient Instructions (Signed)
[name] [name] , Thank you for taking time to come for your Medicare Wellness Visit. I appreciate your ongoing commitment to your health goals. Please review the following plan we discussed and let me know if I can assist you in the future.   Screening recommendations/referrals: Colonoscopy: Up to date, due 01/2022 Recommended yearly ophthalmology/optometry visit for glaucoma screening and checkup Recommended yearly dental visit for hygiene and checkup  Vaccinations: Influenza vaccine: Currently due, declined receiving. Pneumococcal vaccine: Completed series Tdap vaccine: Up to date, due 04/2026 Shingles vaccine: Shingrix discussed. Please contact your pharmacy for coverage information.     Advanced directives: Please bring a copy of your POA (Power of Attorney) and/or Living Will to your next appointment.   Conditions/risks identified: Fall risk preventatives discussed today. Recommend to increase fluid intake to 6-8 glasses per day.  Next appointment: 10/18/21 @ 10:20 AM with Dr Rosanna Randy   Preventive Care 75 Years and Older, Male Preventive care refers to lifestyle choices and visits with your health care provider that can promote health and wellness. What does preventive care include?  A yearly physical exam. This is also called an annual well check.  Dental exams once or twice a year.  Routine eye exams. Ask your health care provider how often you should have your eyes checked.  Personal lifestyle choices, including:  Daily care of your teeth and gums.  Regular physical activity.  Eating a healthy diet.  Avoiding tobacco and drug use.  Limiting alcohol use.  Practicing safe sex.  Taking low doses of aspirin every day.  Taking vitamin and mineral supplements as recommended by your health care provider. What happens during an annual well check? The services and screenings done by your health care provider during your annual well check will depend on your age, overall health,  lifestyle risk factors, and family history of disease. Counseling  Your health care provider may ask you questions about your:  Alcohol use.  Tobacco use.  Drug use.  Emotional well-being.  Home and relationship well-being.  Sexual activity.  Eating habits.  History of falls.  Memory and ability to understand (cognition).  Work and work Statistician. Screening  You may have the following tests or measurements:  Height, weight, and BMI.  Blood pressure.  Lipid and cholesterol levels. These may be checked every 5 years, or more frequently if you are over 49 years old.  Skin check.  Lung cancer screening. You may have this screening every year starting at age 75 if you have a 30-pack-year history of smoking and currently smoke or have quit within the past 15 years.  Fecal occult blood test (FOBT) of the stool. You may have this test every year starting at age 75.  Flexible sigmoidoscopy or colonoscopy. You may have a sigmoidoscopy every 5 years or a colonoscopy every 10 years starting at age 75.  Prostate cancer screening. Recommendations will vary depending on your family history and other risks.  Hepatitis C blood test.  Hepatitis B blood test.  Sexually transmitted disease (STD) testing.  Diabetes screening. This is done by checking your blood sugar (glucose) after you have not eaten for a while (fasting). You may have this done every 1-3 years.  Abdominal aortic aneurysm (AAA) screening. You may need this if you are a current or former smoker.  Osteoporosis. You may be screened starting at age 75 if you are at high risk. Talk with your health care provider about your test results, treatment options, and if necessary, the need  for more tests. Vaccines  Your health care provider may recommend certain vaccines, such as:  Influenza vaccine. This is recommended every year.  Tetanus, diphtheria, and acellular pertussis (Tdap, Td) vaccine. You may need a Td booster  every 10 years.  Zoster vaccine. You may need this after age 75.  Pneumococcal 13-valent conjugate (PCV13) vaccine. One dose is recommended after age 75.  Pneumococcal polysaccharide (PPSV23) vaccine. One dose is recommended after age 75. Talk to your health care provider about which screenings and vaccines you need and how often you need them. This information is not intended to replace advice given to you by your health care provider. Make sure you discuss any questions you have with your health care provider. Document Released: 12/30/2015 Document Revised: 08/22/2016 Document Reviewed: 10/04/2015 Elsevier Interactive Patient Education  2017 [place] Prevention in the Home Falls can cause injuries. They can happen to people of all ages. There are many things you can do to make your home safe and to help prevent falls. What can I do on the outside of my home?  Regularly fix the edges of walkways and driveways and fix any cracks.  Remove anything that might make you trip as you walk through a door, such as a raised step or threshold.  Trim any bushes or trees on the path to your home.  Use bright outdoor lighting.  Clear any walking paths of anything that might make someone trip, such as rocks or tools.  Regularly check to see if handrails are loose or broken. Make sure that both sides of any steps have handrails.  Any raised decks and porches should have guardrails on the edges.  Have any leaves, snow, or ice cleared regularly.  Use sand or salt on walking paths during winter.  Clean up any spills in your garage right away. This includes oil or grease spills. What can I do in the bathroom?  Use night lights.  Install grab bars by the toilet and in the tub and shower. Do not use towel bars as grab bars.  Use non-skid mats or decals in the tub or shower.  If you need to sit down in the shower, use a plastic, non-slip stool.  Keep the floor dry. Clean up any  water that spills on the floor as soon as it happens.  Remove soap buildup in the tub or shower regularly.  Attach bath mats securely with double-sided non-slip rug tape.  Do not have throw rugs and other things on the floor that can make you trip. What can I do in the bedroom?  Use night lights.  Make sure that you have a light by your bed that is easy to reach.  Do not use any sheets or blankets that are too big for your bed. They should not hang down onto the floor.  Have a firm chair that has side arms. You can use this for support while you get dressed.  Do not have throw rugs and other things on the floor that can make you trip. What can I do in the kitchen?  Clean up any spills right away.  Avoid walking on wet floors.  Keep items that you use a lot in easy-to-reach places.  If you need to reach something above you, use a strong step stool that has a grab bar.  Keep electrical cords out of the way.  Do not use floor polish or wax that makes floors slippery. If you must use wax, use  non-skid floor wax.  Do not have throw rugs and other things on the floor that can make you trip. What can I do with my stairs?  Do not leave any items on the stairs.  Make sure that there are handrails on both sides of the stairs and use them. Fix handrails that are broken or loose. Make sure that handrails are as long as the stairways.  Check any carpeting to make sure that it is firmly attached to the stairs. Fix any carpet that is loose or worn.  Avoid having throw rugs at the top or bottom of the stairs. If you do have throw rugs, attach them to the floor with carpet tape.  Make sure that you have a light switch at the top of the stairs and the bottom of the stairs. If you do not have them, ask someone to add them for you. What else can I do to help prevent falls?  Wear shoes that:  Do not have high heels.  Have rubber bottoms.  Are comfortable and fit you well.  Are closed  at the toe. Do not wear sandals.  If you use a stepladder:  Make sure that it is fully opened. Do not climb a closed stepladder.  Make sure that both sides of the stepladder are locked into place.  Ask someone to hold it for you, if possible.  Clearly mark and make sure that you can see:  Any grab bars or handrails.  First and last steps.  Where the edge of each step is.  Use tools that help you move around (mobility aids) if they are needed. These include:  Canes.  Walkers.  Scooters.  Crutches.  Turn on the lights when you go into a dark area. Replace any light bulbs as soon as they burn out.  Set up your furniture so you have a clear path. Avoid moving your furniture around.  If any of your floors are uneven, fix them.  If there are any pets around you, be aware of where they are.  Review your medicines with your doctor. Some medicines can make you feel dizzy. This can increase your chance of falling. Ask your doctor what other things that you can do to help prevent falls. This information is not intended to replace advice given to you by your health care provider. Make sure you discuss any questions you have with your health care provider. Document Released: 09/29/2009 Document Revised: 05/10/2016 Document Reviewed: 01/07/2015 Elsevier Interactive Patient Education  2017 [employer].

## 2021-01-23 DIAGNOSIS — G249 Dystonia, unspecified: Secondary | ICD-10-CM | POA: Diagnosis not present

## 2021-01-23 DIAGNOSIS — I6529 Occlusion and stenosis of unspecified carotid artery: Secondary | ICD-10-CM | POA: Diagnosis not present

## 2021-01-23 DIAGNOSIS — E538 Deficiency of other specified B group vitamins: Secondary | ICD-10-CM | POA: Diagnosis not present

## 2021-01-23 DIAGNOSIS — Z79899 Other long term (current) drug therapy: Secondary | ICD-10-CM | POA: Diagnosis not present

## 2021-01-23 DIAGNOSIS — G20 Parkinson's disease: Secondary | ICD-10-CM | POA: Diagnosis not present

## 2021-03-13 ENCOUNTER — Telehealth: Payer: Self-pay

## 2021-03-13 NOTE — Telephone Encounter (Signed)
Copied from [place] [id number]. Topic: General - Other >> Mar 13, 2021 11:58 AM Oneta Rack wrote: Reason for CRM: patient would like to speak with Nurse or PCP prior to scheduling appt. Patient did not want to elaborate.

## 2021-03-13 NOTE — Telephone Encounter (Signed)
Patient reports that he has had a dull ache under both ribs X 3-5 months. He reports that the pain is becoming for frequent. He reports that this comes and goes. He denies any chest pain, shortness of breath, or injuries. He does have mid to lower back pain as well. Appt was scheduled on 04/13/21 for evaluation. Advised patient that if symptoms worsen to be evaluated at the ED. Pt verbalized understanding.

## 2021-04-09 ENCOUNTER — Encounter: Payer: Self-pay | Admitting: Family Medicine

## 2021-04-09 DIAGNOSIS — G8929 Other chronic pain: Secondary | ICD-10-CM | POA: Diagnosis not present

## 2021-04-09 DIAGNOSIS — I252 Old myocardial infarction: Secondary | ICD-10-CM | POA: Diagnosis not present

## 2021-04-09 DIAGNOSIS — G20 Parkinson's disease: Secondary | ICD-10-CM | POA: Diagnosis not present

## 2021-04-09 DIAGNOSIS — I10 Essential (primary) hypertension: Secondary | ICD-10-CM | POA: Diagnosis not present

## 2021-04-09 DIAGNOSIS — N529 Male erectile dysfunction, unspecified: Secondary | ICD-10-CM | POA: Diagnosis not present

## 2021-04-09 DIAGNOSIS — E785 Hyperlipidemia, unspecified: Secondary | ICD-10-CM | POA: Diagnosis not present

## 2021-04-09 DIAGNOSIS — R69 Illness, unspecified: Secondary | ICD-10-CM | POA: Diagnosis not present

## 2021-04-09 DIAGNOSIS — K5900 Constipation, unspecified: Secondary | ICD-10-CM | POA: Diagnosis not present

## 2021-04-09 DIAGNOSIS — I2510 Atherosclerotic heart disease of native coronary artery without angina pectoris: Secondary | ICD-10-CM | POA: Diagnosis not present

## 2021-04-09 DIAGNOSIS — Z008 Encounter for other general examination: Secondary | ICD-10-CM | POA: Diagnosis not present

## 2021-04-09 DIAGNOSIS — R32 Unspecified urinary incontinence: Secondary | ICD-10-CM | POA: Diagnosis not present

## 2021-04-13 ENCOUNTER — Encounter: Payer: Self-pay | Admitting: Family Medicine

## 2021-04-13 ENCOUNTER — Other Ambulatory Visit: Payer: Self-pay

## 2021-04-13 ENCOUNTER — Ambulatory Visit (INDEPENDENT_AMBULATORY_CARE_PROVIDER_SITE_OTHER): Payer: Medicare HMO | Admitting: Family Medicine

## 2021-04-13 VITALS — BP 114/63 | HR 56 | Temp 98.1°F | Resp 16 | Ht 68.0 in | Wt 164.0 lb

## 2021-04-13 DIAGNOSIS — K5900 Constipation, unspecified: Secondary | ICD-10-CM | POA: Diagnosis not present

## 2021-04-13 DIAGNOSIS — K429 Umbilical hernia without obstruction or gangrene: Secondary | ICD-10-CM

## 2021-04-13 DIAGNOSIS — E559 Vitamin D deficiency, unspecified: Secondary | ICD-10-CM | POA: Diagnosis not present

## 2021-04-13 DIAGNOSIS — G20 Parkinson's disease: Secondary | ICD-10-CM

## 2021-04-13 DIAGNOSIS — I10 Essential (primary) hypertension: Secondary | ICD-10-CM

## 2021-04-13 DIAGNOSIS — R1084 Generalized abdominal pain: Secondary | ICD-10-CM | POA: Diagnosis not present

## 2021-04-13 DIAGNOSIS — R17 Unspecified jaundice: Secondary | ICD-10-CM

## 2021-04-13 NOTE — Patient Instructions (Signed)
Decrease amlodipine to 5mg  daily.   Try Glycolax once daily. If constipation has not improved in 1 week, try Metamucil once daily.

## 2021-04-13 NOTE — Progress Notes (Signed)
Established patient visit   Patient: [name] [name].   DOB: 02/20/1946   75 y.o. Male  MRN: [id number] Visit Date: 04/13/2021  Today's healthcare provider: Wilhemena Durie, MD   Chief Complaint  Patient presents with  . bilateral rib pain   Subjective    HPI  Rib Pain Patient reports that he has had a dull ache under both ribs X 3-5 months. He reports that the pain is becoming for frequent. He reports that this comes and goes. He denies any chest pain, shortness of breath, or injuries. He does have mid to lower back pain as well.  He is worried about issue of abdominal discomfort.  He states he occasionally gets bloating.  He has had a friend that was diagnosed with cancer and is worried he has cancer.  Appetite has been fine and bowel movements have been normal.  No GU symptoms.  He has been constipated.  He has been having a bowel movement about every 4 days after having 1 every 1 to 2 days before. Parkinson's disease is stable and overall has been doing well other than the above.  Blood pressures been well controlled.      Medications: Outpatient Medications Prior to Visit  Medication Sig  . amLODipine (NORVASC) 10 MG tablet Take 1 tablet (10 mg total) by mouth daily.  Marland Kitchen aspirin 81 MG EC tablet Take 81 mg by mouth daily.   Marland Kitchen atorvastatin (LIPITOR) 80 MG tablet Take 1 tablet (80 mg) by mouth once daily  . benazepril (LOTENSIN) 40 MG tablet Take 1 tablet (40 mg total) by mouth daily.  . carbidopa-levodopa (SINEMET IR) 25-100 MG tablet Take 2 tablets by mouth 3 (three) times daily.  . carbidopa-levodopa-entacapone (STALEVO) 50-200-200 MG tablet Take 1 tablet by mouth in the morning and at bedtime.   . Cyanocobalamin (VITAMIN B-12) 2500 MCG SUBL Place under the tongue daily.  . Omega-3 Fatty Acids (OMEGA 3 PO) Take 1,400 mg by mouth daily.  . Vitamin D, Ergocalciferol, (DRISDOL) 1.25 MG (50000 UT) CAPS capsule Take 1 capsule (50,000 Units total) by mouth every 7 (seven)  days.  . Amantadine HCl 100 MG tablet Take 50 mg by mouth 2 (two) times daily. (Patient not taking: Reported on 01/05/2021)  . B Complex Vitamins (VITAMIN B COMPLEX PO) Take by mouth daily. (Patient not taking: No sig reported)  . carbidopa-levodopa (SINEMET IR) 25-250 MG tablet Take 2 tablets by mouth 3 (three) times daily.  (Patient not taking: No sig reported)  . tamsulosin (FLOMAX) 0.4 MG CAPS capsule Take 1 capsule (0.4 mg total) by mouth daily after supper. (Patient not taking: No sig reported)   No facility-administered medications prior to visit.    Review of Systems  Constitutional: Negative for activity change and fatigue.  Respiratory: Negative for cough and shortness of breath.   Cardiovascular: Negative for chest pain, palpitations and leg swelling.  Musculoskeletal: Positive for back pain and myalgias.  Neurological: Negative for dizziness, weakness, light-headedness and numbness.        Objective    BP 114/63   Pulse (!) 56   Temp 98.1 F (36.7 C)   Resp 16   Ht 5\' 8"  (1.727 m)   Wt 164 lb (74.4 kg)   BMI 24.94 kg/m  BP Readings from Last 3 Encounters:  04/13/21 114/63  10/19/20 120/84  10/17/20 122/68   Wt Readings from Last 3 Encounters:  04/13/21 164 lb (74.4 kg)  10/19/20 164 lb  6 oz (74.6 kg)  10/17/20 168 lb (76.2 kg)       Physical Exam Vitals reviewed.  Constitutional:      Appearance: He is well-developed.  HENT:     Head: Normocephalic and atraumatic.     Right Ear: External ear normal.     Left Ear: External ear normal.     Nose: Nose normal.  Eyes:     General: No scleral icterus.    Conjunctiva/sclera: Conjunctivae normal.     Pupils: Pupils are equal, round, and reactive to light.  Neck:     Thyroid: No thyromegaly.  Cardiovascular:     Rate and Rhythm: Normal rate and regular rhythm.     Heart sounds: Normal heart sounds.  Pulmonary:     Effort: Pulmonary effort is normal.     Breath sounds: Normal breath sounds.  Abdominal:      General: There is no distension.     Palpations: Abdomen is soft. There is no mass.     Tenderness: There is no abdominal tenderness.  Lymphadenopathy:     Cervical: No cervical adenopathy.  Skin:    General: Skin is warm and dry.     Comments: SKs of face.  Neurological:     Mental Status: He is alert and oriented to person, place, and time. Mental status is at baseline.     Comments: Parkinson stigmata.  Psychiatric:        Mood and Affect: Mood normal.        Behavior: Behavior normal.        Thought Content: Thought content normal.        Judgment: Judgment normal.       No results found for any visits on 04/13/21.  Assessment & Plan     1. Generalized abdominal pain Abdominal ultrasound.  Follow-up 1 to 2 months. - CBC with Differential/Platelet - US Abdomen Complete; Future  2. Constipation, unspecified constipation type Try GlycoLax daily if constipation does not improve try Metamucil daily  3. PD (Parkinson's disease) ([place]) Treatment per neurology  4. Umbilical hernia without obstruction and without gangrene All umbilical hernia nontender reducible and almost certainly not the cause of the abdominal discomfort and constipation.  May need surgical referral in the future however.  5. Elevated bilirubin Follow-up blood work - Comprehensive metabolic panel  6. Vitamin D deficiency  - VITAMIN D 25 Hydroxy (Vit-D Deficiency, Fractures)  7. Essential hypertension Well-controlled. - Comprehensive metabolic panel - TSH   No follow-ups on file.      I, Wilhemena Durie, MD, have reviewed all documentation for this visit. The documentation on 04/17/21 for the exam, diagnosis, procedures, and orders are all accurate and complete.    Koron Godeaux Cranford Mon, MD  [facility] [phone] (phone) [phone] (fax)  [place]

## 2021-04-14 DIAGNOSIS — I10 Essential (primary) hypertension: Secondary | ICD-10-CM | POA: Diagnosis not present

## 2021-04-14 DIAGNOSIS — E559 Vitamin D deficiency, unspecified: Secondary | ICD-10-CM | POA: Diagnosis not present

## 2021-04-14 DIAGNOSIS — R17 Unspecified jaundice: Secondary | ICD-10-CM | POA: Diagnosis not present

## 2021-04-14 DIAGNOSIS — R1084 Generalized abdominal pain: Secondary | ICD-10-CM | POA: Diagnosis not present

## 2021-04-15 LAB — CBC WITH DIFFERENTIAL/PLATELET
Basophils Absolute: 0 10*3/uL (ref 0.0–0.2)
Basos: 0 %
EOS (ABSOLUTE): 0 10*3/uL (ref 0.0–0.4)
Eos: 0 %
Hematocrit: 42.9 % (ref 37.5–51.0)
Hemoglobin: 14.4 g/dL (ref 13.0–17.7)
Immature Grans (Abs): 0 10*3/uL (ref 0.0–0.1)
Immature Granulocytes: 0 %
Lymphocytes Absolute: 1.3 10*3/uL (ref 0.7–3.1)
Lymphs: 27 %
MCH: 31.8 pg (ref 26.6–33.0)
MCHC: 33.6 g/dL (ref 31.5–35.7)
MCV: 95 fL (ref 79–97)
Monocytes Absolute: 0.5 10*3/uL (ref 0.1–0.9)
Monocytes: 11 %
Neutrophils Absolute: 3 10*3/uL (ref 1.4–7.0)
Neutrophils: 62 %
Platelets: 230 10*3/uL (ref 150–450)
RBC: 4.53 x10E6/uL (ref 4.14–5.80)
RDW: 11.9 % (ref 11.6–15.4)
WBC: 4.9 10*3/uL (ref 3.4–10.8)

## 2021-04-15 LAB — COMPREHENSIVE METABOLIC PANEL
ALT: 12 IU/L (ref 0–44)
AST: 17 IU/L (ref 0–40)
Albumin/Globulin Ratio: 2 (ref 1.2–2.2)
Albumin: 4.5 g/dL (ref 3.7–4.7)
Alkaline Phosphatase: 59 IU/L (ref 44–121)
BUN/Creatinine Ratio: 22 (ref 10–24)
BUN: 15 mg/dL (ref 8–27)
Bilirubin Total: 0.8 mg/dL (ref 0.0–1.2)
CO2: 23 mmol/L (ref 20–29)
Calcium: 9.2 mg/dL (ref 8.6–10.2)
Chloride: 100 mmol/L (ref 96–106)
Creatinine, Ser: 0.68 mg/dL — ABNORMAL LOW (ref 0.76–1.27)
Globulin, Total: 2.3 g/dL (ref 1.5–4.5)
Glucose: 87 mg/dL (ref 65–99)
Potassium: 4.9 mmol/L (ref 3.5–5.2)
Sodium: 138 mmol/L (ref 134–144)
Total Protein: 6.8 g/dL (ref 6.0–8.5)
eGFR: 98 mL/min/{1.73_m2} (ref >59)

## 2021-04-15 LAB — TSH: TSH: 0.786 u[IU]/mL (ref 0.450–4.500)

## 2021-04-15 LAB — VITAMIN D 25 HYDROXY (VIT D DEFICIENCY, FRACTURES): Vit D, 25-Hydroxy: 36.1 ng/mL (ref 30.0–100.0)

## 2021-04-19 ENCOUNTER — Ambulatory Visit (HOSPITAL_COMMUNITY)
Admission: RE | Admit: 2021-04-19 | Discharge: 2021-04-19 | Disposition: A | Discharge status: Home / Self Care | Payer: Medicare HMO | Source: Ambulatory Visit | Attending: Family Medicine | Admitting: Family Medicine

## 2021-04-19 ENCOUNTER — Other Ambulatory Visit: Payer: Self-pay

## 2021-04-19 DIAGNOSIS — R1084 Generalized abdominal pain: Secondary | ICD-10-CM | POA: Insufficient documentation

## 2021-04-19 DIAGNOSIS — N281 Cyst of kidney, acquired: Secondary | ICD-10-CM | POA: Diagnosis not present

## 2021-04-24 ENCOUNTER — Telehealth: Payer: Self-pay

## 2021-04-24 NOTE — Telephone Encounter (Signed)
Patient was notified of results. Expressed understanding.  

## 2021-04-24 NOTE — Telephone Encounter (Signed)
Copied from [place] [id number]. Topic: General - Other >> Apr 24, 2021 11:51 AM Keene Breath wrote: Reason for CRM: Patient called to ask the nurse or doctor to called to ask about his radiology results.  He stated he has not heard anything yet and was told it would take a day or two.  Please advise and call patient as soon as the results are available.  CB # [phone]

## 2021-05-12 ENCOUNTER — Telehealth: Payer: Self-pay

## 2021-05-12 ENCOUNTER — Ambulatory Visit: Payer: Self-pay | Admitting: *Deleted

## 2021-05-12 NOTE — Telephone Encounter (Signed)
Copied from [place] [id number]. Topic: General - Other >> May 05, 2021  2:03 PM Leward Quan A wrote: Reason for CRM: Patient would like a call back form Dr Alcide Clever or his nurse to discuss recent test and lab results. Please call Ph#  [phone]

## 2021-05-12 NOTE — Telephone Encounter (Signed)
Copied from [place] [id number]. Topic: General - Other >> May 12, 2021  9:17 AM Leward Quan A wrote: Reason for CRM: Patient called in to say that he would like a call back from Dr Rosanna Randy or the nurse to discuss the result of his blood work and imaging  thoroughly. Per patient someone called him and said everything was normal but he need to go over everything in a proper manner Ph#  [phone]

## 2021-05-12 NOTE — Telephone Encounter (Signed)
Returned call to patient. LMOVM for pt to return call. Okay for [facility] triage to give pt results, labs and [place].

## 2021-05-12 NOTE — Telephone Encounter (Signed)
Returned call to patient. LMOVM for pt to return call. Okay for [facility] triage to give pt results, labs and [place].

## 2021-05-12 NOTE — Telephone Encounter (Signed)
Per [facility], Pt given lab results and [place] results per notes of Dr. Rosanna Randy from [facility], [place]  on 05/13/21. Pt verbalized understanding of all normal labs and [place] results.

## 2021-05-12 NOTE — Telephone Encounter (Signed)
Per [facility], Pt given lab results and [place] results per notes of Dr. Rosanna Randy from [facility], [place]  on 05/13/21. Pt verbalized understanding of all normal labs and [place] results.

## 2021-05-12 NOTE — Telephone Encounter (Signed)
Pt given lab results and [place] results per notes of Dr. Rosanna Randy from [facility], [place]  on 05/13/21. Pt verbalized understanding of all normal labs and [place] results.

## 2021-05-18 DIAGNOSIS — C61 Malignant neoplasm of prostate: Secondary | ICD-10-CM | POA: Diagnosis not present

## 2021-05-22 ENCOUNTER — Ambulatory Visit: Payer: Self-pay | Admitting: Family Medicine

## 2021-05-22 DIAGNOSIS — C61 Malignant neoplasm of prostate: Secondary | ICD-10-CM | POA: Diagnosis not present

## 2021-05-22 DIAGNOSIS — R3915 Urgency of urination: Secondary | ICD-10-CM | POA: Diagnosis not present

## 2021-05-23 ENCOUNTER — Ambulatory Visit: Payer: Self-pay | Admitting: Family Medicine

## 2021-05-24 ENCOUNTER — Telehealth: Payer: Self-pay | Admitting: *Deleted

## 2021-05-24 NOTE — Telephone Encounter (Signed)
Error

## 2021-05-31 DIAGNOSIS — M5136 Other intervertebral disc degeneration, lumbar region: Secondary | ICD-10-CM | POA: Diagnosis not present

## 2021-05-31 DIAGNOSIS — M9903 Segmental and somatic dysfunction of lumbar region: Secondary | ICD-10-CM | POA: Diagnosis not present

## 2021-05-31 DIAGNOSIS — M5418 Radiculopathy, sacral and sacrococcygeal region: Secondary | ICD-10-CM | POA: Diagnosis not present

## 2021-05-31 DIAGNOSIS — M9904 Segmental and somatic dysfunction of sacral region: Secondary | ICD-10-CM | POA: Diagnosis not present

## 2021-05-31 DIAGNOSIS — M7918 Myalgia, other site: Secondary | ICD-10-CM | POA: Diagnosis not present

## 2021-05-31 DIAGNOSIS — M5417 Radiculopathy, lumbosacral region: Secondary | ICD-10-CM | POA: Diagnosis not present

## 2021-05-31 DIAGNOSIS — M5137 Other intervertebral disc degeneration, lumbosacral region: Secondary | ICD-10-CM | POA: Diagnosis not present

## 2021-06-01 DIAGNOSIS — G20 Parkinson's disease: Secondary | ICD-10-CM | POA: Diagnosis not present

## 2021-06-01 DIAGNOSIS — E538 Deficiency of other specified B group vitamins: Secondary | ICD-10-CM | POA: Diagnosis not present

## 2021-06-01 DIAGNOSIS — G249 Dystonia, unspecified: Secondary | ICD-10-CM | POA: Diagnosis not present

## 2021-06-01 DIAGNOSIS — M545 Low back pain, unspecified: Secondary | ICD-10-CM | POA: Diagnosis not present

## 2021-06-02 DIAGNOSIS — Z85828 Personal history of other malignant neoplasm of skin: Secondary | ICD-10-CM | POA: Diagnosis not present

## 2021-06-02 DIAGNOSIS — L988 Other specified disorders of the skin and subcutaneous tissue: Secondary | ICD-10-CM | POA: Diagnosis not present

## 2021-06-02 DIAGNOSIS — D2261 Melanocytic nevi of right upper limb, including shoulder: Secondary | ICD-10-CM | POA: Diagnosis not present

## 2021-06-02 DIAGNOSIS — X32XXXA Exposure to sunlight, initial encounter: Secondary | ICD-10-CM | POA: Diagnosis not present

## 2021-06-02 DIAGNOSIS — D225 Melanocytic nevi of trunk: Secondary | ICD-10-CM | POA: Diagnosis not present

## 2021-06-02 DIAGNOSIS — L570 Actinic keratosis: Secondary | ICD-10-CM | POA: Diagnosis not present

## 2021-06-02 DIAGNOSIS — Z85820 Personal history of malignant melanoma of skin: Secondary | ICD-10-CM | POA: Diagnosis not present

## 2021-06-02 DIAGNOSIS — D2262 Melanocytic nevi of left upper limb, including shoulder: Secondary | ICD-10-CM | POA: Diagnosis not present

## 2021-06-05 ENCOUNTER — Other Ambulatory Visit: Payer: Self-pay

## 2021-06-05 ENCOUNTER — Ambulatory Visit (INDEPENDENT_AMBULATORY_CARE_PROVIDER_SITE_OTHER): Payer: Medicare HMO | Admitting: Family Medicine

## 2021-06-05 ENCOUNTER — Encounter: Payer: Self-pay | Admitting: Family Medicine

## 2021-06-05 VITALS — BP 93/57 | HR 66 | Resp 18 | Ht 68.0 in | Wt 163.0 lb

## 2021-06-05 DIAGNOSIS — M5137 Other intervertebral disc degeneration, lumbosacral region: Secondary | ICD-10-CM | POA: Diagnosis not present

## 2021-06-05 DIAGNOSIS — R1084 Generalized abdominal pain: Secondary | ICD-10-CM | POA: Diagnosis not present

## 2021-06-05 DIAGNOSIS — I9589 Other hypotension: Secondary | ICD-10-CM

## 2021-06-05 DIAGNOSIS — M5418 Radiculopathy, sacral and sacrococcygeal region: Secondary | ICD-10-CM | POA: Diagnosis not present

## 2021-06-05 DIAGNOSIS — M5417 Radiculopathy, lumbosacral region: Secondary | ICD-10-CM | POA: Diagnosis not present

## 2021-06-05 DIAGNOSIS — G20 Parkinson's disease: Secondary | ICD-10-CM | POA: Diagnosis not present

## 2021-06-05 DIAGNOSIS — M9903 Segmental and somatic dysfunction of lumbar region: Secondary | ICD-10-CM | POA: Diagnosis not present

## 2021-06-05 DIAGNOSIS — K5900 Constipation, unspecified: Secondary | ICD-10-CM | POA: Diagnosis not present

## 2021-06-05 DIAGNOSIS — I10 Essential (primary) hypertension: Secondary | ICD-10-CM

## 2021-06-05 DIAGNOSIS — M7918 Myalgia, other site: Secondary | ICD-10-CM | POA: Diagnosis not present

## 2021-06-05 DIAGNOSIS — M9904 Segmental and somatic dysfunction of sacral region: Secondary | ICD-10-CM | POA: Diagnosis not present

## 2021-06-05 DIAGNOSIS — M5136 Other intervertebral disc degeneration, lumbar region: Secondary | ICD-10-CM | POA: Diagnosis not present

## 2021-06-05 MED ORDER — AMLODIPINE BESYLATE 5 MG PO TABS: 5.0000 mg | ORAL_TABLET | Freq: Every day | ORAL | 3 refills | Status: DC

## 2021-06-05 NOTE — Progress Notes (Signed)
I,April Miller,acting as a scribe for Wilhemena Durie, MD.,have documented all relevant documentation on the behalf of Wilhemena Durie, MD,as directed by  Wilhemena Durie, MD while in the presence of Wilhemena Durie, MD.   Established patient visit   Patient: [name] [name].   DOB: Jul 25, 1946   75 y.o. Male  MRN: [id number] Visit Date: 06/05/2021  Today's healthcare provider: Wilhemena Durie, MD   Chief Complaint  Patient presents with   Follow-up   Abdominal Pain   Subjective    HPI  Patient comes in today for follow-up of abdominal discomfort which is somewhat improved.  Ultrasound was negative as was lab work.  He continues have some constipation. Follow up for Generalized abdominal pain and Constipation  The patient was last seen for this 2 months ago. Changes made at last visit include; Abdominal ultrasound obtained. Advised to try GlycoLax daily if constipation does not improve try Metamucil daily.   He reports good compliance with treatment. He feels that condition is Unchanged. He is not having side effects. none  ----------------------------------------------------------------------------  Patient states he tried Glycolax and had no improvement with constipation. Also patient states he has had constant abdominal up until yesterday and today, he did not have any.      Medications: Outpatient Medications Prior to Visit  Medication Sig   amLODipine (NORVASC) 10 MG tablet Take 1 tablet (10 mg total) by mouth daily.   aspirin 81 MG EC tablet Take 81 mg by mouth daily.    atorvastatin (LIPITOR) 80 MG tablet Take 1 tablet (80 mg) by mouth once daily   benazepril (LOTENSIN) 40 MG tablet Take 1 tablet (40 mg total) by mouth daily.   carbidopa-levodopa (SINEMET IR) 25-100 MG tablet Take 2 tablets by mouth 3 (three) times daily.   carbidopa-levodopa-entacapone (STALEVO) 50-200-200 MG tablet Take 1 tablet by mouth in the morning and at bedtime.     Cyanocobalamin (VITAMIN B-12) 2500 MCG SUBL Place under the tongue daily.   Omega-3 Fatty Acids (OMEGA 3 PO) Take 1,400 mg by mouth daily.   Vitamin D, Ergocalciferol, (DRISDOL) 1.25 MG (50000 UT) CAPS capsule Take 1 capsule (50,000 Units total) by mouth every 7 (seven) days.   Amantadine HCl 100 MG tablet Take 50 mg by mouth 2 (two) times daily. (Patient not taking: Reported on 01/05/2021)   B Complex Vitamins (VITAMIN B COMPLEX PO) Take by mouth daily. (Patient not taking: No sig reported)   carbidopa-levodopa (SINEMET IR) 25-250 MG tablet Take 2 tablets by mouth 3 (three) times daily.  (Patient not taking: No sig reported)   tamsulosin (FLOMAX) 0.4 MG CAPS capsule Take 1 capsule (0.4 mg total) by mouth daily after supper. (Patient not taking: No sig reported)   No facility-administered medications prior to visit.    Review of Systems      Objective    BP (!) 93/57 (BP Location: Left Arm, Patient Position: Sitting, Cuff Size: Normal)   Pulse 66   Resp 18   Ht 5\' 8"  (1.727 m)   Wt 163 lb (73.9 kg)   SpO2 98%   BMI 24.78 kg/m  BP Readings from Last 3 Encounters:  06/05/21 (!) 93/57  04/13/21 114/63  10/19/20 120/84   Wt Readings from Last 3 Encounters:  06/05/21 163 lb (73.9 kg)  04/13/21 164 lb (74.4 kg)  10/19/20 164 lb 6 oz (74.6 kg)       Physical Exam Vitals reviewed.  Constitutional:  Appearance: He is well-developed.  HENT:     Head: Normocephalic and atraumatic.     Right Ear: External ear normal.     Left Ear: External ear normal.     Nose: Nose normal.  Eyes:     General: No scleral icterus.    Conjunctiva/sclera: Conjunctivae normal.     Pupils: Pupils are equal, round, and reactive to light.  Neck:     Thyroid: No thyromegaly.  Cardiovascular:     Rate and Rhythm: Normal rate and regular rhythm.     Heart sounds: Normal heart sounds.  Pulmonary:     Effort: Pulmonary effort is normal.     Breath sounds: Normal breath sounds.  Abdominal:      General: There is no distension.     Palpations: Abdomen is soft. There is no mass.     Tenderness: There is no abdominal tenderness.  Lymphadenopathy:     Cervical: No cervical adenopathy.  Skin:    General: Skin is warm and dry.     Comments: SKs of face.  Neurological:     Mental Status: He is alert and oriented to person, place, and time. Mental status is at baseline.     Comments: Parkinson stigmata.  Psychiatric:        Mood and Affect: Mood normal.        Behavior: Behavior normal.        Thought Content: Thought content normal.        Judgment: Judgment normal.      No results found for any visits on 06/05/21.  Assessment & Plan      1. Essential hypertension Decrease amlodipine from 10 to 5 mg daily.  Follow-up in a month.  Patient hypotensive today. - amLODipine (NORVASC) 5 MG tablet; Take 1 tablet (5 mg total) by mouth daily.  Dispense: 90 tablet; Refill: 3  2. Other specified hypotension  - amLODipine (NORVASC) 5 MG tablet; Take 1 tablet (5 mg total) by mouth daily.  Dispense: 90 tablet; Refill: 3  3. Generalized abdominal pain   4. Constipation, unspecified constipation type Try over-the-counter probiotic daily.  May need GI referral  5. PD (Parkinson's disease) ([place]) Clinically stable.   No follow-ups on file.      I, Wilhemena Durie, MD, have reviewed all documentation for this visit. The documentation on 06/10/21 for the exam, diagnosis, procedures, and orders are all accurate and complete.    Shawana Knoch Cranford Mon, MD  [facility] [phone] (phone) [phone] (fax)  [place]

## 2021-06-05 NOTE — Patient Instructions (Signed)
Decrease amlodipine from 10 mg to 5 mg daily. Try over-the-counter probiotic daily for stomach discomfort.

## 2021-06-06 DIAGNOSIS — M7918 Myalgia, other site: Secondary | ICD-10-CM | POA: Diagnosis not present

## 2021-06-06 DIAGNOSIS — M5137 Other intervertebral disc degeneration, lumbosacral region: Secondary | ICD-10-CM | POA: Diagnosis not present

## 2021-06-06 DIAGNOSIS — M9903 Segmental and somatic dysfunction of lumbar region: Secondary | ICD-10-CM | POA: Diagnosis not present

## 2021-06-06 DIAGNOSIS — M5418 Radiculopathy, sacral and sacrococcygeal region: Secondary | ICD-10-CM | POA: Diagnosis not present

## 2021-06-06 DIAGNOSIS — M9904 Segmental and somatic dysfunction of sacral region: Secondary | ICD-10-CM | POA: Diagnosis not present

## 2021-06-06 DIAGNOSIS — M5136 Other intervertebral disc degeneration, lumbar region: Secondary | ICD-10-CM | POA: Diagnosis not present

## 2021-06-06 DIAGNOSIS — M5417 Radiculopathy, lumbosacral region: Secondary | ICD-10-CM | POA: Diagnosis not present

## 2021-06-08 DIAGNOSIS — M5417 Radiculopathy, lumbosacral region: Secondary | ICD-10-CM | POA: Diagnosis not present

## 2021-06-08 DIAGNOSIS — M5136 Other intervertebral disc degeneration, lumbar region: Secondary | ICD-10-CM | POA: Diagnosis not present

## 2021-06-08 DIAGNOSIS — M9903 Segmental and somatic dysfunction of lumbar region: Secondary | ICD-10-CM | POA: Diagnosis not present

## 2021-06-08 DIAGNOSIS — M5418 Radiculopathy, sacral and sacrococcygeal region: Secondary | ICD-10-CM | POA: Diagnosis not present

## 2021-06-08 DIAGNOSIS — M5137 Other intervertebral disc degeneration, lumbosacral region: Secondary | ICD-10-CM | POA: Diagnosis not present

## 2021-06-08 DIAGNOSIS — M7918 Myalgia, other site: Secondary | ICD-10-CM | POA: Diagnosis not present

## 2021-06-08 DIAGNOSIS — M9904 Segmental and somatic dysfunction of sacral region: Secondary | ICD-10-CM | POA: Diagnosis not present

## 2021-06-12 DIAGNOSIS — M5137 Other intervertebral disc degeneration, lumbosacral region: Secondary | ICD-10-CM | POA: Diagnosis not present

## 2021-06-12 DIAGNOSIS — M9904 Segmental and somatic dysfunction of sacral region: Secondary | ICD-10-CM | POA: Diagnosis not present

## 2021-06-12 DIAGNOSIS — M5136 Other intervertebral disc degeneration, lumbar region: Secondary | ICD-10-CM | POA: Diagnosis not present

## 2021-06-12 DIAGNOSIS — M7918 Myalgia, other site: Secondary | ICD-10-CM | POA: Diagnosis not present

## 2021-06-12 DIAGNOSIS — M9903 Segmental and somatic dysfunction of lumbar region: Secondary | ICD-10-CM | POA: Diagnosis not present

## 2021-06-12 DIAGNOSIS — M5418 Radiculopathy, sacral and sacrococcygeal region: Secondary | ICD-10-CM | POA: Diagnosis not present

## 2021-06-12 DIAGNOSIS — M5417 Radiculopathy, lumbosacral region: Secondary | ICD-10-CM | POA: Diagnosis not present

## 2021-06-15 DIAGNOSIS — M9903 Segmental and somatic dysfunction of lumbar region: Secondary | ICD-10-CM | POA: Diagnosis not present

## 2021-06-15 DIAGNOSIS — M7918 Myalgia, other site: Secondary | ICD-10-CM | POA: Diagnosis not present

## 2021-06-15 DIAGNOSIS — M5137 Other intervertebral disc degeneration, lumbosacral region: Secondary | ICD-10-CM | POA: Diagnosis not present

## 2021-06-15 DIAGNOSIS — M5418 Radiculopathy, sacral and sacrococcygeal region: Secondary | ICD-10-CM | POA: Diagnosis not present

## 2021-06-15 DIAGNOSIS — M5136 Other intervertebral disc degeneration, lumbar region: Secondary | ICD-10-CM | POA: Diagnosis not present

## 2021-06-15 DIAGNOSIS — M5417 Radiculopathy, lumbosacral region: Secondary | ICD-10-CM | POA: Diagnosis not present

## 2021-06-15 DIAGNOSIS — M9904 Segmental and somatic dysfunction of sacral region: Secondary | ICD-10-CM | POA: Diagnosis not present

## 2021-06-20 DIAGNOSIS — M9903 Segmental and somatic dysfunction of lumbar region: Secondary | ICD-10-CM | POA: Diagnosis not present

## 2021-06-20 DIAGNOSIS — M7918 Myalgia, other site: Secondary | ICD-10-CM | POA: Diagnosis not present

## 2021-06-20 DIAGNOSIS — M9904 Segmental and somatic dysfunction of sacral region: Secondary | ICD-10-CM | POA: Diagnosis not present

## 2021-06-20 DIAGNOSIS — M5417 Radiculopathy, lumbosacral region: Secondary | ICD-10-CM | POA: Diagnosis not present

## 2021-06-20 DIAGNOSIS — M5136 Other intervertebral disc degeneration, lumbar region: Secondary | ICD-10-CM | POA: Diagnosis not present

## 2021-06-20 DIAGNOSIS — M5418 Radiculopathy, sacral and sacrococcygeal region: Secondary | ICD-10-CM | POA: Diagnosis not present

## 2021-06-20 DIAGNOSIS — M5137 Other intervertebral disc degeneration, lumbosacral region: Secondary | ICD-10-CM | POA: Diagnosis not present

## 2021-06-21 DIAGNOSIS — M9903 Segmental and somatic dysfunction of lumbar region: Secondary | ICD-10-CM | POA: Diagnosis not present

## 2021-06-21 DIAGNOSIS — M5417 Radiculopathy, lumbosacral region: Secondary | ICD-10-CM | POA: Diagnosis not present

## 2021-06-21 DIAGNOSIS — M5137 Other intervertebral disc degeneration, lumbosacral region: Secondary | ICD-10-CM | POA: Diagnosis not present

## 2021-06-21 DIAGNOSIS — M9904 Segmental and somatic dysfunction of sacral region: Secondary | ICD-10-CM | POA: Diagnosis not present

## 2021-06-21 DIAGNOSIS — M5418 Radiculopathy, sacral and sacrococcygeal region: Secondary | ICD-10-CM | POA: Diagnosis not present

## 2021-06-21 DIAGNOSIS — M5136 Other intervertebral disc degeneration, lumbar region: Secondary | ICD-10-CM | POA: Diagnosis not present

## 2021-06-21 DIAGNOSIS — M7918 Myalgia, other site: Secondary | ICD-10-CM | POA: Diagnosis not present

## 2021-06-22 DIAGNOSIS — M7918 Myalgia, other site: Secondary | ICD-10-CM | POA: Diagnosis not present

## 2021-06-22 DIAGNOSIS — M5137 Other intervertebral disc degeneration, lumbosacral region: Secondary | ICD-10-CM | POA: Diagnosis not present

## 2021-06-22 DIAGNOSIS — M5418 Radiculopathy, sacral and sacrococcygeal region: Secondary | ICD-10-CM | POA: Diagnosis not present

## 2021-06-22 DIAGNOSIS — M9904 Segmental and somatic dysfunction of sacral region: Secondary | ICD-10-CM | POA: Diagnosis not present

## 2021-06-22 DIAGNOSIS — M5417 Radiculopathy, lumbosacral region: Secondary | ICD-10-CM | POA: Diagnosis not present

## 2021-06-22 DIAGNOSIS — M5136 Other intervertebral disc degeneration, lumbar region: Secondary | ICD-10-CM | POA: Diagnosis not present

## 2021-06-22 DIAGNOSIS — M9903 Segmental and somatic dysfunction of lumbar region: Secondary | ICD-10-CM | POA: Diagnosis not present

## 2021-06-26 DIAGNOSIS — M9904 Segmental and somatic dysfunction of sacral region: Secondary | ICD-10-CM | POA: Diagnosis not present

## 2021-06-26 DIAGNOSIS — M5136 Other intervertebral disc degeneration, lumbar region: Secondary | ICD-10-CM | POA: Diagnosis not present

## 2021-06-26 DIAGNOSIS — M9903 Segmental and somatic dysfunction of lumbar region: Secondary | ICD-10-CM | POA: Diagnosis not present

## 2021-06-26 DIAGNOSIS — M5137 Other intervertebral disc degeneration, lumbosacral region: Secondary | ICD-10-CM | POA: Diagnosis not present

## 2021-06-26 DIAGNOSIS — M5417 Radiculopathy, lumbosacral region: Secondary | ICD-10-CM | POA: Diagnosis not present

## 2021-06-26 DIAGNOSIS — M5418 Radiculopathy, sacral and sacrococcygeal region: Secondary | ICD-10-CM | POA: Diagnosis not present

## 2021-06-26 DIAGNOSIS — M7918 Myalgia, other site: Secondary | ICD-10-CM | POA: Diagnosis not present

## 2021-06-28 DIAGNOSIS — M7918 Myalgia, other site: Secondary | ICD-10-CM | POA: Diagnosis not present

## 2021-06-28 DIAGNOSIS — M5136 Other intervertebral disc degeneration, lumbar region: Secondary | ICD-10-CM | POA: Diagnosis not present

## 2021-06-28 DIAGNOSIS — M9903 Segmental and somatic dysfunction of lumbar region: Secondary | ICD-10-CM | POA: Diagnosis not present

## 2021-06-28 DIAGNOSIS — M5418 Radiculopathy, sacral and sacrococcygeal region: Secondary | ICD-10-CM | POA: Diagnosis not present

## 2021-06-28 DIAGNOSIS — M9904 Segmental and somatic dysfunction of sacral region: Secondary | ICD-10-CM | POA: Diagnosis not present

## 2021-06-28 DIAGNOSIS — M5137 Other intervertebral disc degeneration, lumbosacral region: Secondary | ICD-10-CM | POA: Diagnosis not present

## 2021-06-28 DIAGNOSIS — M5417 Radiculopathy, lumbosacral region: Secondary | ICD-10-CM | POA: Diagnosis not present

## 2021-06-30 DIAGNOSIS — M5418 Radiculopathy, sacral and sacrococcygeal region: Secondary | ICD-10-CM | POA: Diagnosis not present

## 2021-06-30 DIAGNOSIS — M5136 Other intervertebral disc degeneration, lumbar region: Secondary | ICD-10-CM | POA: Diagnosis not present

## 2021-06-30 DIAGNOSIS — M9904 Segmental and somatic dysfunction of sacral region: Secondary | ICD-10-CM | POA: Diagnosis not present

## 2021-06-30 DIAGNOSIS — M7918 Myalgia, other site: Secondary | ICD-10-CM | POA: Diagnosis not present

## 2021-06-30 DIAGNOSIS — M9903 Segmental and somatic dysfunction of lumbar region: Secondary | ICD-10-CM | POA: Diagnosis not present

## 2021-06-30 DIAGNOSIS — M5137 Other intervertebral disc degeneration, lumbosacral region: Secondary | ICD-10-CM | POA: Diagnosis not present

## 2021-06-30 DIAGNOSIS — M5417 Radiculopathy, lumbosacral region: Secondary | ICD-10-CM | POA: Diagnosis not present

## 2021-07-03 ENCOUNTER — Ambulatory Visit: Payer: Self-pay | Admitting: Family Medicine

## 2021-07-03 NOTE — Progress Notes (Deleted)
Established patient visit   Patient: [name] [name].   DOB: 11-Jul-1946   75 y.o. Male  MRN: [id number] Visit Date: 07/03/2021  Today's healthcare provider: Wilhemena Durie, MD   No chief complaint on file.  Subjective    HPI  Hypertension, follow-up  BP Readings from Last 3 Encounters:  06/05/21 (!) 93/57  04/13/21 114/63  10/19/20 120/84   Wt Readings from Last 3 Encounters:  06/05/21 163 lb (73.9 kg)  04/13/21 164 lb (74.4 kg)  10/19/20 164 lb 6 oz (74.6 kg)     He was last seen for hypertension 1 months ago.  BP at that visit was 93/57. Management since that visit includes; Decreased amlodipine from 10 to 5 mg daily.  Follow-up in a month.  Patient hypotensive today. He reports {excellent/good/fair/poor:19665} compliance with treatment. He {is/is not:9024} having side effects. {document side effects if present:1} He {is/is not:9024} exercising. He {is/is not:9024} adherent to low salt diet.   Outside blood pressures are {enter patient reported home BP, or 'not being checked':1}.  He {does/does not:200015} smoke.  Use of agents associated with hypertension: {bp agents assoc with hypertension:511}.   --------------------------------------------------------------------------------------------------- Follow up for Constipation  The patient was last seen for this 1 months ago. Changes made at last visit include; Try over-the-counter probiotic daily.  May need GI referral.  He reports {excellent/good/fair/poor:19665} compliance with treatment. He feels that condition is {improved/worse/unchanged:3041574}. He {is/is not:21021397} having side effects. ***  -----------------------------------------------------------------------------------------  {Show patient history (optional):23778}   Medications: Outpatient Medications Prior to Visit  Medication Sig   Amantadine HCl 100 MG tablet Take 50 mg by mouth 2 (two) times daily. (Patient not taking: Reported on  01/05/2021)   amLODipine (NORVASC) 5 MG tablet Take 1 tablet (5 mg total) by mouth daily.   aspirin 81 MG EC tablet Take 81 mg by mouth daily.    atorvastatin (LIPITOR) 80 MG tablet Take 1 tablet (80 mg) by mouth once daily   B Complex Vitamins (VITAMIN B COMPLEX PO) Take by mouth daily. (Patient not taking: No sig reported)   benazepril (LOTENSIN) 40 MG tablet Take 1 tablet (40 mg total) by mouth daily.   carbidopa-levodopa (SINEMET IR) 25-100 MG tablet Take 2 tablets by mouth 3 (three) times daily.   carbidopa-levodopa (SINEMET IR) 25-250 MG tablet Take 2 tablets by mouth 3 (three) times daily.  (Patient not taking: No sig reported)   carbidopa-levodopa-entacapone (STALEVO) 50-200-200 MG tablet Take 1 tablet by mouth in the morning and at bedtime.    Cyanocobalamin (VITAMIN B-12) 2500 MCG SUBL Place under the tongue daily.   Omega-3 Fatty Acids (OMEGA 3 PO) Take 1,400 mg by mouth daily.   tamsulosin (FLOMAX) 0.4 MG CAPS capsule Take 1 capsule (0.4 mg total) by mouth daily after supper. (Patient not taking: No sig reported)   Vitamin D, Ergocalciferol, (DRISDOL) 1.25 MG (50000 UT) CAPS capsule Take 1 capsule (50,000 Units total) by mouth every 7 (seven) days.   No facility-administered medications prior to visit.    Review of Systems  Constitutional:  Negative for appetite change, chills and fever.  Respiratory:  Negative for chest tightness, shortness of breath and wheezing.   Cardiovascular:  Negative for chest pain and palpitations.  Gastrointestinal:  Negative for abdominal pain, nausea and vomiting.   {Labs  Heme  Chem  Endocrine  Serology  Results Review (optional):23779}   Objective    There were no vitals taken for this visit. {Show previous vital  signs (optional):23777}   Physical Exam  ***  No results found for any visits on 07/03/21.  Assessment & Plan     ***  No follow-ups on file.      {provider attestation***:1}   Wilhemena Durie, MD  [facility] [phone] (phone) [phone] (fax)  [place]

## 2021-09-05 ENCOUNTER — Other Ambulatory Visit: Payer: Self-pay

## 2021-09-05 ENCOUNTER — Encounter: Payer: Self-pay | Admitting: Family Medicine

## 2021-09-05 ENCOUNTER — Ambulatory Visit (INDEPENDENT_AMBULATORY_CARE_PROVIDER_SITE_OTHER): Payer: Medicare HMO | Admitting: Family Medicine

## 2021-09-05 ENCOUNTER — Ambulatory Visit: Payer: Medicare HMO | Admitting: Family Medicine

## 2021-09-05 VITALS — BP 146/82 | HR 63 | Resp 18 | Ht 68.0 in | Wt 162.0 lb

## 2021-09-05 DIAGNOSIS — I25118 Atherosclerotic heart disease of native coronary artery with other forms of angina pectoris: Secondary | ICD-10-CM

## 2021-09-05 DIAGNOSIS — R1084 Generalized abdominal pain: Secondary | ICD-10-CM | POA: Diagnosis not present

## 2021-09-05 DIAGNOSIS — H34232 Retinal artery branch occlusion, left eye: Secondary | ICD-10-CM | POA: Diagnosis not present

## 2021-09-05 DIAGNOSIS — G20 Parkinson's disease: Secondary | ICD-10-CM

## 2021-09-05 DIAGNOSIS — E782 Mixed hyperlipidemia: Secondary | ICD-10-CM | POA: Diagnosis not present

## 2021-09-05 DIAGNOSIS — G3184 Mild cognitive impairment, so stated: Secondary | ICD-10-CM | POA: Diagnosis not present

## 2021-09-05 DIAGNOSIS — E559 Vitamin D deficiency, unspecified: Secondary | ICD-10-CM

## 2021-09-05 DIAGNOSIS — Z8546 Personal history of malignant neoplasm of prostate: Secondary | ICD-10-CM

## 2021-09-05 DIAGNOSIS — H2513 Age-related nuclear cataract, bilateral: Secondary | ICD-10-CM | POA: Diagnosis not present

## 2021-09-05 MED ORDER — OMEPRAZOLE 20 MG PO CPDR: 20.0000 mg | DELAYED_RELEASE_CAPSULE | Freq: Every day | ORAL | 1 refills | Status: DC

## 2021-09-05 MED ORDER — CARBIDOPA-LEVODOPA-ENTACAPONE 50-200-200 MG PO TABS: 1.0000 | ORAL_TABLET | Freq: Two times a day (BID) | ORAL | 3 refills | Status: AC

## 2021-09-05 NOTE — Progress Notes (Signed)
I,April Miller,acting as a scribe for Wilhemena Durie, MD.,have documented all relevant documentation on the behalf of Wilhemena Durie, MD,as directed by  Wilhemena Durie, MD while in the presence of Wilhemena Durie, MD.   Established patient visit   Patient: [name] [name].   DOB: 1946-06-29   75 y.o. Male  MRN: [id number] Visit Date: 09/05/2021  Today's healthcare provider: Wilhemena Durie, MD   Chief Complaint  Patient presents with   Follow-up   Hypertension   Subjective    HPI  Patient comes in today for follow-up of chronic medical problems.  He has right upper quadrant and left upper quadrant pain after eating and first thing in the morning. Parkinson's is stable and all other problems are stable. Hypertension, follow-up  BP Readings from Last 3 Encounters:  09/05/21 (!) 146/82  06/05/21 (!) 93/57  04/13/21 114/63   Wt Readings from Last 3 Encounters:  09/05/21 162 lb (73.5 kg)  06/05/21 163 lb (73.9 kg)  04/13/21 164 lb (74.4 kg)     He was last seen for hypertension 3 months ago.  BP at that visit was 93/57. Management since that visit includesl; Decreased amlodipine from 10 to 5 mg daily.  Follow-up in a month.  Patient hypotensive today. He reports good compliance with treatment. He is not having side effects. none He is not exercising. He is adherent to low salt diet.   Outside blood pressures are not checking.  He does not smoke.  Use of agents associated with hypertension: none.   --------------------------------------------------------------------------------------------------- Follow up for Constipation:  The patient was last seen for this 3 months ago. Changes made at last visit include; Try over-the-counter probiotic daily.  May need GI referral.  He reports good compliance with treatment. He feels that condition is Unchanged. He is not having side effects.  none  -----------------------------------------------------------------------------------------  Patient states he did start a probiotic. Patient states he is not as constipated as he was. However he is still haing the same abdominal pain.    Medications: Outpatient Medications Prior to Visit  Medication Sig   amLODipine (NORVASC) 5 MG tablet Take 1 tablet (5 mg total) by mouth daily.   aspirin 81 MG EC tablet Take 81 mg by mouth daily.    atorvastatin (LIPITOR) 80 MG tablet Take 1 tablet (80 mg) by mouth once daily   B Complex Vitamins (VITAMIN B COMPLEX PO) Take by mouth daily.   benazepril (LOTENSIN) 40 MG tablet Take 1 tablet (40 mg total) by mouth daily.   carbidopa-levodopa (SINEMET IR) 25-100 MG tablet Take 2 tablets by mouth 3 (three) times daily.   Cyanocobalamin (VITAMIN B-12) 2500 MCG SUBL Place under the tongue daily.   Omega-3 Fatty Acids (OMEGA 3 PO) Take 1,400 mg by mouth daily.   Vitamin D, Ergocalciferol, (DRISDOL) 1.25 MG (50000 UT) CAPS capsule Take 1 capsule (50,000 Units total) by mouth every 7 (seven) days.   [DISCONTINUED] carbidopa-levodopa-entacapone (STALEVO) 50-200-200 MG tablet Take 1 tablet by mouth in the morning and at bedtime.    [DISCONTINUED] Amantadine HCl 100 MG tablet Take 50 mg by mouth 2 (two) times daily. (Patient not taking: Reported on 01/05/2021)   [DISCONTINUED] carbidopa-levodopa (SINEMET IR) 25-250 MG tablet Take 2 tablets by mouth 3 (three) times daily.  (Patient not taking: No sig reported)   [DISCONTINUED] tamsulosin (FLOMAX) 0.4 MG CAPS capsule Take 1 capsule (0.4 mg total) by mouth daily after supper. (Patient not taking: No sig  reported)   No facility-administered medications prior to visit.    Review of Systems  Constitutional:  Negative for appetite change, chills and fever.  Respiratory:  Negative for chest tightness, shortness of breath and wheezing.   Cardiovascular:  Negative for chest pain and palpitations.  Gastrointestinal:   Negative for abdominal pain, nausea and vomiting.       Objective    BP (!) 146/82 (BP Location: Right Arm, Patient Position: Sitting, Cuff Size: Normal)   Pulse 63   Resp 18   Ht 5\' 8"  (1.727 m)   Wt 162 lb (73.5 kg)   SpO2 98%   BMI 24.63 kg/m     Physical Exam Vitals reviewed.  Constitutional:      Appearance: He is well-developed.  HENT:     Head: Normocephalic and atraumatic.     Right Ear: External ear normal.     Left Ear: External ear normal.     Nose: Nose normal.  Eyes:     General: No scleral icterus.    Conjunctiva/sclera: Conjunctivae normal.     Pupils: Pupils are equal, round, and reactive to light.  Neck:     Thyroid: No thyromegaly.  Cardiovascular:     Rate and Rhythm: Normal rate and regular rhythm.     Heart sounds: Normal heart sounds.  Pulmonary:     Effort: Pulmonary effort is normal.     Breath sounds: Normal breath sounds.  Abdominal:     General: There is no distension.     Palpations: Abdomen is soft. There is no mass.     Tenderness: There is no abdominal tenderness.  Lymphadenopathy:     Cervical: No cervical adenopathy.  Skin:    General: Skin is warm and dry.     Comments: SKs of face.  Neurological:     Mental Status: He is alert and oriented to person, place, and time. Mental status is at baseline.     Comments: Parkinson stigmata.  Psychiatric:        Mood and Affect: Mood normal.        Behavior: Behavior normal.        Thought Content: Thought content normal.        Judgment: Judgment normal.      No results found for any visits on 09/05/21.  Assessment & Plan     1. Generalized abdominal pain Patient had negative ultrasound a few months ago.  Omeprazole does not help may need GI referral. - omeprazole (PRILOSEC) 20 MG capsule; Take 1 capsule (20 mg total) by mouth daily.  Dispense: 90 capsule; Refill: 1  2. Atherosclerosis of native coronary artery of native heart with stable angina pectoris ([place]) All risk factors  treated.  3. PARKINSON'S DISEASE Stable on Sinemet  4. History of prostate cancer   5. Vitamin D deficiency   6. Mixed hyperlipidemia On atorvastatin 80  7. MCI (mild cognitive impairment) MMSE in 2023    Return in about 4 months (around 01/05/2022).      I, Wilhemena Durie, MD, have reviewed all documentation for this visit. The documentation on 09/09/21 for the exam, diagnosis, procedures, and orders are all accurate and complete.    Aamya Orellana Cranford Mon, MD  [facility] [phone] (phone) [phone] (fax)  [place]

## 2021-09-27 DIAGNOSIS — H2512 Age-related nuclear cataract, left eye: Secondary | ICD-10-CM | POA: Diagnosis not present

## 2021-09-28 ENCOUNTER — Encounter: Payer: Self-pay | Admitting: Ophthalmology

## 2021-10-10 NOTE — Discharge Instructions (Signed)

## 2021-10-11 ENCOUNTER — Encounter
Admission: RE | Disposition: A | Discharge status: Home / Self Care | Payer: Self-pay | Source: Home / Self Care | Attending: Ophthalmology

## 2021-10-11 ENCOUNTER — Ambulatory Visit
Admission: RE | Admit: 2021-10-11 | Discharge: 2021-10-11 | Disposition: A | Discharge status: Home / Self Care | Payer: Medicare HMO | Attending: Ophthalmology | Admitting: Ophthalmology

## 2021-10-11 ENCOUNTER — Encounter: Payer: Self-pay | Admitting: Ophthalmology

## 2021-10-11 ENCOUNTER — Ambulatory Visit: Payer: Medicare HMO | Admitting: Anesthesiology

## 2021-10-11 ENCOUNTER — Other Ambulatory Visit: Payer: Self-pay

## 2021-10-11 DIAGNOSIS — Z888 Allergy status to other drugs, medicaments and biological substances status: Secondary | ICD-10-CM | POA: Insufficient documentation

## 2021-10-11 DIAGNOSIS — Z880 Allergy status to penicillin: Secondary | ICD-10-CM | POA: Diagnosis not present

## 2021-10-11 DIAGNOSIS — H2512 Age-related nuclear cataract, left eye: Secondary | ICD-10-CM | POA: Insufficient documentation

## 2021-10-11 DIAGNOSIS — H25812 Combined forms of age-related cataract, left eye: Secondary | ICD-10-CM | POA: Diagnosis not present

## 2021-10-11 DIAGNOSIS — Z79899 Other long term (current) drug therapy: Secondary | ICD-10-CM | POA: Diagnosis not present

## 2021-10-11 DIAGNOSIS — Z7982 Long term (current) use of aspirin: Secondary | ICD-10-CM | POA: Insufficient documentation

## 2021-10-11 HISTORY — PX: CATARACT EXTRACTION W/PHACO: SHX586

## 2021-10-11 SURGERY — PHACOEMULSIFICATION, CATARACT, WITH IOL INSERTION
Anesthesia: Monitor Anesthesia Care | Site: Eye | Laterality: Left

## 2021-10-11 MED ORDER — BRIMONIDINE TARTRATE-TIMOLOL 0.2-0.5 % OP SOLN
OPHTHALMIC | Status: DC | PRN
  Administered 2021-10-11: 1 [drp] via OPHTHALMIC

## 2021-10-11 MED ORDER — MOXIFLOXACIN HCL 0.5 % OP SOLN
OPHTHALMIC | Status: DC | PRN
  Administered 2021-10-11: 0.2 mL via OPHTHALMIC

## 2021-10-11 MED ORDER — ARMC OPHTHALMIC DILATING DROPS
1.0000 "application " | OPHTHALMIC | Status: DC | PRN
  Administered 2021-10-11 (×3): 1 via OPHTHALMIC

## 2021-10-11 MED ORDER — MIDAZOLAM HCL 2 MG/2ML IJ SOLN
INTRAMUSCULAR | Status: DC | PRN
  Administered 2021-10-11: 1 mg via INTRAVENOUS

## 2021-10-11 MED ORDER — SIGHTPATH DOSE#1 BSS IO SOLN
INTRAOCULAR | Status: DC | PRN
  Administered 2021-10-11: 15 mL

## 2021-10-11 MED ORDER — FENTANYL CITRATE (PF) 100 MCG/2ML IJ SOLN
INTRAMUSCULAR | Status: DC | PRN
  Administered 2021-10-11: 50 ug via INTRAVENOUS

## 2021-10-11 MED ORDER — SIGHTPATH DOSE#1 BSS IO SOLN
INTRAOCULAR | Status: DC | PRN
  Administered 2021-10-11: 1 mL via INTRAMUSCULAR

## 2021-10-11 MED ORDER — SIGHTPATH DOSE#1 NA HYALUR & NA CHOND-NA HYALUR IO KIT
PACK | INTRAOCULAR | Status: DC | PRN
  Administered 2021-10-11: 1 via OPHTHALMIC

## 2021-10-11 MED ORDER — SIGHTPATH DOSE#1 BSS IO SOLN
INTRAOCULAR | Status: DC | PRN
  Administered 2021-10-11: 54 mL via OPHTHALMIC

## 2021-10-11 MED ORDER — LACTATED RINGERS IV SOLN: INTRAVENOUS | Status: DC

## 2021-10-11 MED ORDER — TETRACAINE HCL 0.5 % OP SOLN
1.0000 [drp] | OPHTHALMIC | Status: DC | PRN
  Administered 2021-10-11 (×3): 1 [drp] via OPHTHALMIC

## 2021-10-11 SURGICAL SUPPLY — 18 items
CANNULA ANT/CHMB 27G (MISCELLANEOUS) IMPLANT
CANNULA ANT/CHMB 27GA (MISCELLANEOUS) ×2 IMPLANT
GLOVE SRG 8 PF TXTR STRL LF DI (GLOVE) ×1 IMPLANT
GLOVE SURG ENC TEXT LTX SZ7.5 (GLOVE) ×2 IMPLANT
GLOVE SURG GAMMEX PI TX LF 7.5 (GLOVE) ×1 IMPLANT
GLOVE SURG UNDER POLY LF SZ8 (GLOVE) ×2
GOWN STRL REUS W/ TWL LRG LVL3 (GOWN DISPOSABLE) ×2 IMPLANT
GOWN STRL REUS W/TWL LRG LVL3 (GOWN DISPOSABLE) ×4
LENS IOL DIOP 19.5 (Intraocular Lens) ×2 IMPLANT
LENS IOL TECNIS MONO 19.5 (Intraocular Lens) IMPLANT
MARKER SKIN DUAL TIP RULER LAB (MISCELLANEOUS) ×2 IMPLANT
NDL FILTER BLUNT 18X1 1/2 (NEEDLE) ×2 IMPLANT
NEEDLE FILTER BLUNT 18X 1/2SAF (NEEDLE) ×2
NEEDLE FILTER BLUNT 18X1 1/2 (NEEDLE) ×2 IMPLANT
SYR 3ML LL SCALE MARK (SYRINGE) ×4 IMPLANT
SYR TB 1ML LUER SLIP (SYRINGE) ×2 IMPLANT
WATER STERILE IRR 250ML POUR (IV SOLUTION) ×2 IMPLANT
WIPE NON LINTING 3.25X3.25 (MISCELLANEOUS) ×2 IMPLANT

## 2021-10-11 NOTE — Transfer of Care (Signed)
Immediate Anesthesia Transfer of Care Note  Patient: [name] [name].  Procedure(s) Performed: CATARACT EXTRACTION PHACO AND INTRAOCULAR LENS PLACEMENT (IOC) LEFT (Left: Eye)  Patient Location: PACU  Anesthesia Type: MAC  Level of Consciousness: awake, alert  and patient cooperative  Airway and Oxygen Therapy: Patient Spontanous Breathing and Patient connected to supplemental oxygen  Post-op Assessment: Post-op Vital signs reviewed, Patient's Cardiovascular Status Stable, Respiratory Function Stable, Patent Airway and No signs of Nausea or vomiting  Post-op Vital Signs: Reviewed and stable  Complications: No notable events documented.

## 2021-10-11 NOTE — Anesthesia Procedure Notes (Signed)
Procedure Name: MAC Date/Time: 10/11/2021 11:48 AM Performed by: Dionne Bucy, CRNA Pre-anesthesia Checklist: Patient identified, Emergency Drugs available, Suction available, Patient being monitored and Timeout performed Patient Re-evaluated:Patient Re-evaluated prior to induction Oxygen Delivery Method: Nasal cannula Placement Confirmation: positive ETCO2

## 2021-10-11 NOTE — Op Note (Signed)
OPERATIVE NOTE  Timm Bonenberger [id number] 10/11/2021   PREOPERATIVE DIAGNOSIS:  Nuclear sclerotic cataract left eye. H25.12   POSTOPERATIVE DIAGNOSIS:    Nuclear sclerotic cataract left eye.     PROCEDURE:  Phacoemusification with posterior chamber intraocular lens placement of the left eye  Ultrasound time: Procedure(s): CATARACT EXTRACTION PHACO AND INTRAOCULAR LENS PLACEMENT (IOC) LEFT (Left) 1:02  cde 5.2 LENS:   Implant Name Type Inv. Item Serial No. Manufacturer Lot No. LRB No. Used Action  LENS IOL DIOP 19.5 - [id number] Intraocular Lens LENS IOL DIOP 19.5 [id number] JOHNSON   Left 1 Implanted      SURGEON:  Wyonia Hough, MD   ANESTHESIA:  Topical with tetracaine drops and 2% Xylocaine jelly, augmented with 1% preservative-free intracameral lidocaine.    COMPLICATIONS:  None.   DESCRIPTION OF PROCEDURE:  The patient was identified in the holding room and transported to the operating room and placed in the supine position under the operating microscope.  The left eye was identified as the operative eye and it was prepped and draped in the usual sterile ophthalmic fashion.   A 1 millimeter clear-corneal paracentesis was made at the 1:30 position.  0.5 ml of preservative-free 1% lidocaine was injected into the anterior chamber.  The anterior chamber was filled with Viscoat viscoelastic.  A 2.4 millimeter keratome was used to make a near-clear corneal incision at the 10:30 position.  .  A curvilinear capsulorrhexis was made with a cystotome and capsulorrhexis forceps.  Balanced salt solution was used to hydrodissect and hydrodelineate the nucleus.   Phacoemulsification was then used in stop and chop fashion to remove the lens nucleus and epinucleus.  The remaining cortex was then removed using the irrigation and aspiration handpiece. Provisc was then placed into the capsular bag to distend it for lens placement.  A lens was then injected into the capsular bag.  The  remaining viscoelastic was aspirated.   Wounds were hydrated with balanced salt solution.  The anterior chamber was inflated to a physiologic pressure with balanced salt solution.  No wound leaks were noted. Vigamox 0.2 ml of a 1mg  per ml solution was injected into the anterior chamber for a dose of 0.2 mg of intracameral antibiotic at the completion of the case. \  Timolol and Brimonidine drops were applied to the eye.  The patient was taken to the recovery room in stable condition without complications of anesthesia or surgery.  Shataria Crist 10/11/2021, 12:05 PM

## 2021-10-11 NOTE — Anesthesia Postprocedure Evaluation (Signed)
Anesthesia Post Note  Patient: [name] [name].  Procedure(s) Performed: CATARACT EXTRACTION PHACO AND INTRAOCULAR LENS PLACEMENT (IOC) LEFT (Left: Eye)     Patient location during evaluation: PACU Anesthesia Type: MAC Level of consciousness: awake Pain management: pain level controlled Vital Signs Assessment: post-procedure vital signs reviewed and stable Respiratory status: spontaneous breathing Cardiovascular status: stable Anesthetic complications: no   No notable events documented.  Gillian Scarce

## 2021-10-11 NOTE — H&P (Signed)
[facility]   Primary Care Physician:  Jerrol Banana., MD Ophthalmologist: Dr. Leandrew Koyanagi  Pre-Procedure History & Physical: HPI:  [name] [name]. is a 75 y.o. male here for ophthalmic surgery.   Past Medical History:  Diagnosis Date   Acute myocardial infarction of other inferior wall, episode of care unspecified May 1994   Cancer [facility])    skin / SCC   Coronary atherosclerosis of native coronary artery    History of shingles    waist area/right back side   Occlusion and stenosis of carotid artery without mention of cerebral infarction    Other and unspecified hyperlipidemia    Severe   Parkinson's disease    Peripheral vascular disease, unspecified ([place])    Precancerous skin lesion    Prostate cancer ([place]) 10/2016   low risk   Shingles August 2015   Squamous cell cancer of skin of nose    Unspecified cerebral artery occlusion with cerebral infarction 2006   CVA   Unspecified essential hypertension     Past Surgical History:  Procedure Laterality Date   BIOPSY PROSTATE  11/12/2017   COLONOSCOPY WITH PROPOFOL N/A 02/11/2017   Procedure: COLONOSCOPY WITH PROPOFOL;  Surgeon: Manya Silvas, MD;  Location: [facility] ENDOSCOPY;  Service: Endoscopy;  Laterality: N/A;   HERNIA REPAIR  02/17/15   right inguinal hernia , laparoscopic placement of Bard 3-D max mesh   HERNIA REPAIR  3/316   ventral hernia, umbilical, open repair   MOHS SURGERY     SKIN BIOPSY     skin cancer   TRANSRECTAL ULTRASOUND  11/12/2017    Prior to Admission medications   Medication Sig Start Date End Date Taking? Authorizing Provider  amLODipine (NORVASC) 5 MG tablet Take 1 tablet (5 mg total) by mouth daily. 06/05/21  Yes Jerrol Banana., MD  aspirin 81 MG EC tablet Take 81 mg by mouth daily.    Yes [provider]  atorvastatin (LIPITOR) 80 MG tablet Take 1 tablet (80 mg) by mouth once daily 10/19/20  Yes Gollan, Kathlene November, MD  B Complex Vitamins (VITAMIN B  COMPLEX PO) Take by mouth daily.   Yes [provider]  benazepril (LOTENSIN) 40 MG tablet Take 1 tablet (40 mg total) by mouth daily. 10/19/20  Yes Gollan, Kathlene November, MD  carbidopa-levodopa (SINEMET IR) 25-100 MG tablet Take 2 tablets by mouth 3 (three) times daily. 12/09/20  Yes [provider]  carbidopa-levodopa-entacapone (STALEVO) 50-200-200 MG tablet Take 1 tablet by mouth in the morning and at bedtime. 09/05/21  Yes Jerrol Banana., MD  Cyanocobalamin (VITAMIN B-12) 2500 MCG SUBL Place under the tongue daily.   Yes [provider]  Omega-3 Fatty Acids (OMEGA 3 PO) Take 1,400 mg by mouth daily.   Yes [provider]  omeprazole (PRILOSEC) 20 MG capsule Take 1 capsule (20 mg total) by mouth daily. 09/05/21  Yes Jerrol Banana., MD  Vitamin D, Ergocalciferol, (DRISDOL) 1.25 MG (50000 UT) CAPS capsule Take 1 capsule (50,000 Units total) by mouth every 7 (seven) days. 10/06/19  Yes Jerrol Banana., MD    Allergies as of 09/06/2021 - Review Complete 09/05/2021  Allergen Reaction Noted   Donepezil Nausea Only 12/20/2017   Penicillins  06/14/2009    Family History  Problem Relation Age of Onset   Hypertension Mother    Heart disease Father    Hypertension Sister    Hypertension Brother    Heart  disease Other    Prostate cancer Neg Hx    Kidney cancer Neg Hx    Colon cancer Neg Hx    Pancreatic cancer Neg Hx    Breast cancer Neg Hx     Social History   Socioeconomic History   Marital status: Married    Spouse name: Not on file   Number of children: 3   Years of education: Not on file   Highest education level: Bachelor's degree (e.g., BA, AB, BS)  Occupational History   Occupation: GOLF TEACHER    Employer: [employer]    Comment: retired  Tobacco Use   Smoking status: Never   Smokeless tobacco: Never  Vaping Use   Vaping Use: Never used  Substance and Sexual Activity   Alcohol use: Yes    Alcohol/week:  21.0 standard drinks    Types: 21 Glasses of wine per week    Comment: 3 glasses of wine/day   Drug use: No   Sexual activity: Not Currently  Other Topics Concern   Not on file  Social History Narrative   Married   Gets regular exercise   Social Determinants of Health   Financial Resource Strain: Low Risk    Difficulty of Paying Living Expenses: Not hard at all  Food Insecurity: No Food Insecurity   Worried About Charity fundraiser in the Last Year: Never true   Arboriculturist in the Last Year: Never true  Transportation Needs: No Transportation Needs   Lack of Transportation (Medical): No   Lack of Transportation (Non-Medical): No  Physical Activity: Sufficiently Active   Days of Exercise per Week: 6 days   Minutes of Exercise per Session: 30 min  Stress: No Stress Concern Present   Feeling of Stress : Not at all  Social Connections: Socially Integrated   Frequency of Communication with Friends and Family: Three times a week   Frequency of Social Gatherings with Friends and Family: More than three times a week   Attends Religious Services: More than 4 times per year   Active Member of Clubs or Organizations: Yes   Attends Music therapist: More than 4 times per year   Marital Status: Married  Human resources officer Violence: Not At Risk   Fear of Current or Ex-Partner: No   Emotionally Abused: No   Physically Abused: No   Sexually Abused: No    Review of Systems: See HPI, otherwise negative ROS  Physical Exam: BP (!) 109/46   Pulse (!) 58   Temp 97.7 F (36.5 C) (Temporal)   Ht 5\' 8"  (1.727 m)   Wt 72.8 kg   SpO2 96%   BMI 24.39 kg/m  General:   Alert,  pleasant and cooperative in NAD Head:  Normocephalic and atraumatic. Lungs:  Clear to auscultation.    Heart:  Regular rate and rhythm. Gr IV murmur  Impression/Plan: [name] is here for ophthalmic surgery.  Risks, benefits, limitations, and alternatives regarding ophthalmic surgery have  been reviewed with the patient.  Questions have been answered.  All parties agreeable.   Leandrew Koyanagi, MD  10/11/2021, 11:04 AM

## 2021-10-11 NOTE — Anesthesia Preprocedure Evaluation (Signed)
Anesthesia Evaluation  Patient identified by MRN, date of birth, ID band Patient awake    Reviewed: Allergy & Precautions, H&P , NPO status , Patient's Chart, lab work & pertinent test results  Airway Mallampati: II  TM Distance: >3 FB Neck ROM: full    Dental no notable dental hx.    Pulmonary neg pulmonary ROS,    Pulmonary exam normal        Cardiovascular hypertension, On Medications + CAD and + Past MI  Normal cardiovascular exam Rhythm:regular Rate:Normal     Neuro/Psych CVA    GI/Hepatic negative GI ROS, Neg liver ROS,   Endo/Other  negative endocrine ROS  Renal/GU negative Renal ROS  negative genitourinary   Musculoskeletal   Abdominal   Peds  Hematology negative hematology ROS (+)   Anesthesia Other Findings   Reproductive/Obstetrics                             Anesthesia Physical Anesthesia Plan  ASA: 3  Anesthesia Plan: MAC   Post-op Pain Management:    Induction:   PONV Risk Score and Plan: 1 and TIVA and Midazolam  Airway Management Planned:   Additional Equipment:   Intra-op Plan:   Post-operative Plan:   Informed Consent: I have reviewed the patients History and Physical, chart, labs and discussed the procedure including the risks, benefits and alternatives for the proposed anesthesia with the patient or authorized representative who has indicated his/her understanding and acceptance.       Plan Discussed with:   Anesthesia Plan Comments:         Anesthesia Quick Evaluation

## 2021-10-12 ENCOUNTER — Encounter: Payer: Self-pay | Admitting: Ophthalmology

## 2021-10-16 ENCOUNTER — Encounter: Payer: Self-pay | Admitting: Ophthalmology

## 2021-10-17 DIAGNOSIS — H2511 Age-related nuclear cataract, right eye: Secondary | ICD-10-CM | POA: Diagnosis not present

## 2021-10-18 ENCOUNTER — Encounter: Payer: Self-pay | Admitting: Family Medicine

## 2021-10-23 NOTE — Discharge Instructions (Signed)

## 2021-10-25 ENCOUNTER — Ambulatory Visit
Admission: RE | Admit: 2021-10-25 | Discharge: 2021-10-25 | Disposition: A | Discharge status: Home / Self Care | Payer: Medicare HMO | Attending: Ophthalmology | Admitting: Ophthalmology

## 2021-10-25 ENCOUNTER — Ambulatory Visit: Payer: Medicare HMO | Admitting: Anesthesiology

## 2021-10-25 ENCOUNTER — Other Ambulatory Visit: Payer: Self-pay

## 2021-10-25 ENCOUNTER — Encounter
Admission: RE | Disposition: A | Discharge status: Home / Self Care | Payer: Self-pay | Source: Home / Self Care | Attending: Ophthalmology

## 2021-10-25 ENCOUNTER — Encounter: Payer: Self-pay | Admitting: Ophthalmology

## 2021-10-25 DIAGNOSIS — H2511 Age-related nuclear cataract, right eye: Secondary | ICD-10-CM | POA: Diagnosis not present

## 2021-10-25 DIAGNOSIS — I10 Essential (primary) hypertension: Secondary | ICD-10-CM | POA: Diagnosis not present

## 2021-10-25 DIAGNOSIS — Z79899 Other long term (current) drug therapy: Secondary | ICD-10-CM | POA: Diagnosis not present

## 2021-10-25 DIAGNOSIS — H25811 Combined forms of age-related cataract, right eye: Secondary | ICD-10-CM | POA: Diagnosis not present

## 2021-10-25 HISTORY — PX: CATARACT EXTRACTION W/PHACO: SHX586

## 2021-10-25 SURGERY — PHACOEMULSIFICATION, CATARACT, WITH IOL INSERTION
Anesthesia: Monitor Anesthesia Care | Site: Eye | Laterality: Right

## 2021-10-25 MED ORDER — TETRACAINE HCL 0.5 % OP SOLN
1.0000 [drp] | OPHTHALMIC | Status: DC | PRN
  Administered 2021-10-25 (×3): 1 [drp] via OPHTHALMIC

## 2021-10-25 MED ORDER — LACTATED RINGERS IV SOLN: INTRAVENOUS | Status: DC

## 2021-10-25 MED ORDER — SIGHTPATH DOSE#1 BSS IO SOLN
INTRAOCULAR | Status: DC | PRN
  Administered 2021-10-25: 15 mL

## 2021-10-25 MED ORDER — ARMC OPHTHALMIC DILATING DROPS
1.0000 "application " | OPHTHALMIC | Status: DC | PRN
  Administered 2021-10-25 (×3): 1 via OPHTHALMIC

## 2021-10-25 MED ORDER — MIDAZOLAM HCL 2 MG/2ML IJ SOLN
INTRAMUSCULAR | Status: DC | PRN
  Administered 2021-10-25: 1 mg via INTRAVENOUS

## 2021-10-25 MED ORDER — SIGHTPATH DOSE#1 NA HYALUR & NA CHOND-NA HYALUR IO KIT
PACK | INTRAOCULAR | Status: DC | PRN
  Administered 2021-10-25: 1 via OPHTHALMIC

## 2021-10-25 MED ORDER — MOXIFLOXACIN HCL 0.5 % OP SOLN
OPHTHALMIC | Status: DC | PRN
  Administered 2021-10-25: 0.2 mL via OPHTHALMIC

## 2021-10-25 MED ORDER — SIGHTPATH DOSE#1 BSS IO SOLN
INTRAOCULAR | Status: DC | PRN
  Administered 2021-10-25: 1 mL via INTRAMUSCULAR

## 2021-10-25 MED ORDER — FENTANYL CITRATE (PF) 100 MCG/2ML IJ SOLN
INTRAMUSCULAR | Status: DC | PRN
  Administered 2021-10-25: 50 ug via INTRAVENOUS

## 2021-10-25 MED ORDER — BRIMONIDINE TARTRATE-TIMOLOL 0.2-0.5 % OP SOLN
OPHTHALMIC | Status: DC | PRN
  Administered 2021-10-25: 1 [drp] via OPHTHALMIC

## 2021-10-25 MED ORDER — SIGHTPATH DOSE#1 BSS IO SOLN
INTRAOCULAR | Status: DC | PRN
  Administered 2021-10-25: 73 mL via OPHTHALMIC

## 2021-10-25 SURGICAL SUPPLY — 16 items
CANNULA ANT/CHMB 27G (MISCELLANEOUS) IMPLANT
CANNULA ANT/CHMB 27GA (MISCELLANEOUS) ×2 IMPLANT
GLOVE SRG 8 PF TXTR STRL LF DI (GLOVE) ×1 IMPLANT
GLOVE SURG ENC TEXT LTX SZ7.5 (GLOVE) ×2 IMPLANT
GLOVE SURG UNDER POLY LF SZ8 (GLOVE) ×2
GOWN STRL REUS W/ TWL LRG LVL3 (GOWN DISPOSABLE) ×2 IMPLANT
GOWN STRL REUS W/TWL LRG LVL3 (GOWN DISPOSABLE) ×4
LENS IOL TECNIS EYHANCE 20.5 (Intraocular Lens) ×1 IMPLANT
MARKER SKIN DUAL TIP RULER LAB (MISCELLANEOUS) ×2 IMPLANT
NDL FILTER BLUNT 18X1 1/2 (NEEDLE) ×2 IMPLANT
NEEDLE FILTER BLUNT 18X 1/2SAF (NEEDLE) ×2
NEEDLE FILTER BLUNT 18X1 1/2 (NEEDLE) ×2 IMPLANT
SYR 3ML LL SCALE MARK (SYRINGE) ×4 IMPLANT
SYR TB 1ML LUER SLIP (SYRINGE) ×2 IMPLANT
WATER STERILE IRR 250ML POUR (IV SOLUTION) ×2 IMPLANT
WIPE NON LINTING 3.25X3.25 (MISCELLANEOUS) ×2 IMPLANT

## 2021-10-25 NOTE — Anesthesia Postprocedure Evaluation (Signed)
Anesthesia Post Note  Patient: [name] [name].  Procedure(s) Performed: CATARACT EXTRACTION PHACO AND INTRAOCULAR LENS PLACEMENT (IOC) RIGHT (Right: Eye)     Patient location during evaluation: PACU Anesthesia Type: MAC Level of consciousness: awake and alert Pain management: pain level controlled Vital Signs Assessment: post-procedure vital signs reviewed and stable Respiratory status: spontaneous breathing Cardiovascular status: stable Anesthetic complications: no   No notable events documented.  Gillian Scarce

## 2021-10-25 NOTE — Op Note (Signed)
LOCATION:  [place]   PREOPERATIVE DIAGNOSIS:    Nuclear sclerotic cataract right eye. H25.11   POSTOPERATIVE DIAGNOSIS:  Nuclear sclerotic cataract right eye.     PROCEDURE:  Phacoemusification with posterior chamber intraocular lens placement of the right eye   ULTRASOUND TIME: Procedure(s) with comments: CATARACT EXTRACTION PHACO AND INTRAOCULAR LENS PLACEMENT (IOC) RIGHT (Right) - 5.56 00:50.6  LENS:   Implant Name Type Inv. Item Serial No. Manufacturer Lot No. LRB No. Used Action  LENS IOL TECNIS EYHANCE 20.5 - [id number] Intraocular Lens LENS IOL TECNIS EYHANCE 20.5 [id number] [id number]   Right 1 Implanted         SURGEON:  Wyonia Hough, MD   ANESTHESIA:  Topical with tetracaine drops and 2% Xylocaine jelly, augmented with 1% preservative-free intracameral lidocaine.    COMPLICATIONS:  None.   DESCRIPTION OF PROCEDURE:  The patient was identified in the holding room and transported to the operating room and placed in the supine position under the operating microscope.  The right eye was identified as the operative eye and it was prepped and draped in the usual sterile ophthalmic fashion.   A 1 millimeter clear-corneal paracentesis was made at the 12:00 position.  0.5 ml of preservative-free 1% lidocaine was injected into the anterior chamber. The anterior chamber was filled with Viscoat viscoelastic.  A 2.4 millimeter keratome was used to make a near-clear corneal incision at the 9:00 position.  A curvilinear capsulorrhexis was made with a cystotome and capsulorrhexis forceps.  Balanced salt solution was used to hydrodissect and hydrodelineate the nucleus.   Phacoemulsification was then used in stop and chop fashion to remove the lens nucleus and epinucleus.  The remaining cortex was then removed using the irrigation and aspiration handpiece. Provisc was then placed into the capsular bag to distend it for lens placement.  A lens was then injected into the  capsular bag.  The remaining viscoelastic was aspirated.   Wounds were hydrated with balanced salt solution.  The anterior chamber was inflated to a physiologic pressure with balanced salt solution.  No wound leaks were noted. Cefuroxime 0.1 ml of a 10mg /ml solution was injected into the anterior chamber for a dose of 1 mg of intracameral antibiotic at the completion of the case.   Timolol and Brimonidine drops were applied to the eye.  The patient was taken to the recovery room in stable condition without complications of anesthesia or surgery.   Madisun Hargrove 10/25/2021, 11:41 AM

## 2021-10-25 NOTE — Transfer of Care (Signed)
Immediate Anesthesia Transfer of Care Note  Patient: [name] [name].  Procedure(s) Performed: CATARACT EXTRACTION PHACO AND INTRAOCULAR LENS PLACEMENT (IOC) RIGHT (Right: Eye)  Patient Location: PACU  Anesthesia Type: MAC  Level of Consciousness: awake, alert  and patient cooperative  Airway and Oxygen Therapy: Patient Spontanous Breathing and Patient connected to supplemental oxygen  Post-op Assessment: Post-op Vital signs reviewed, Patient's Cardiovascular Status Stable, Respiratory Function Stable, Patent Airway and No signs of Nausea or vomiting  Post-op Vital Signs: Reviewed and stable  Complications: No notable events documented.

## 2021-10-25 NOTE — Anesthesia Preprocedure Evaluation (Signed)
Anesthesia Evaluation  Patient identified by MRN, date of birth, ID band Patient awake    Reviewed: Allergy & Precautions, NPO status , Patient's Chart, lab work & pertinent test results, reviewed documented beta blocker date and time   Airway Mallampati: II  TM Distance: >3 FB Neck ROM: full    Dental no notable dental hx.    Pulmonary neg pulmonary ROS,    Pulmonary exam normal        Cardiovascular hypertension, On Medications Normal cardiovascular exam Rhythm:regular Rate:Normal     Neuro/Psych    GI/Hepatic negative GI ROS, Neg liver ROS,   Endo/Other  negative endocrine ROS  Renal/GU negative Renal ROS     Musculoskeletal   Abdominal   Peds  Hematology negative hematology ROS (+)   Anesthesia Other Findings   Reproductive/Obstetrics                             Anesthesia Physical Anesthesia Plan  ASA: 2  Anesthesia Plan: MAC   Post-op Pain Management:    Induction:   PONV Risk Score and Plan: 1 and TIVA and Midazolam  Airway Management Planned:   Additional Equipment:   Intra-op Plan:   Post-operative Plan:   Informed Consent: I have reviewed the patients History and Physical, chart, labs and discussed the procedure including the risks, benefits and alternatives for the proposed anesthesia with the patient or authorized representative who has indicated his/her understanding and acceptance.       Plan Discussed with:   Anesthesia Plan Comments:         Anesthesia Quick Evaluation

## 2021-10-25 NOTE — H&P (Signed)
[facility]   Primary Care Physician:  Jerrol Banana., MD Ophthalmologist: Dr. Leandrew Koyanagi  Pre-Procedure History & Physical: HPI:  [name] Chhim. is a 75 y.o. male here for ophthalmic surgery.   Past Medical History:  Diagnosis Date   Acute myocardial infarction of other inferior wall, episode of care unspecified 04/1993   Cancer ([place])    skin / SCC   Coronary atherosclerosis of native coronary artery    History of shingles    waist area/right back side   Occlusion and stenosis of carotid artery without mention of cerebral infarction    Other and unspecified hyperlipidemia    Severe   Parkinson's disease    Peripheral vascular disease, unspecified ([place])    Precancerous skin lesion    Prostate cancer ([place]) 10/2016   low risk   Shingles 07/2014   Squamous cell cancer of skin of nose    Unspecified cerebral artery occlusion with cerebral infarction 2006   CVA   Unspecified essential hypertension     Past Surgical History:  Procedure Laterality Date   BIOPSY PROSTATE  11/12/2017   CATARACT EXTRACTION W/PHACO Left 10/11/2021   Procedure: CATARACT EXTRACTION PHACO AND INTRAOCULAR LENS PLACEMENT ([place]) LEFT;  Surgeon: Leandrew Koyanagi, MD;  Location: [place];  Service: Ophthalmology;  Laterality: Left;  5.15 01:01.6   COLONOSCOPY WITH PROPOFOL N/A 02/11/2017   Procedure: COLONOSCOPY WITH PROPOFOL;  Surgeon: Manya Silvas, MD;  Location: [facility] ENDOSCOPY;  Service: Endoscopy;  Laterality: N/A;   HERNIA REPAIR  02/17/15   right inguinal hernia , laparoscopic placement of Bard 3-D max mesh   HERNIA REPAIR  3/316   ventral hernia, umbilical, open repair   MOHS SURGERY     SKIN BIOPSY     skin cancer   TRANSRECTAL ULTRASOUND  11/12/2017    Prior to Admission medications   Medication Sig Start Date End Date Taking? Authorizing Provider  amLODipine (NORVASC) 5 MG tablet Take 1 tablet (5 mg total) by mouth daily. 06/05/21  Yes Jerrol Banana., MD  aspirin 81 MG EC tablet Take 81 mg by mouth daily.    Yes [provider]  atorvastatin (LIPITOR) 80 MG tablet Take 1 tablet (80 mg) by mouth once daily 10/19/20  Yes Gollan, Kathlene November, MD  B Complex Vitamins (VITAMIN B COMPLEX PO) Take by mouth daily.   Yes [provider]  benazepril (LOTENSIN) 40 MG tablet Take 1 tablet (40 mg total) by mouth daily. 10/19/20  Yes Gollan, Kathlene November, MD  carbidopa-levodopa (SINEMET IR) 25-100 MG tablet Take 2 tablets by mouth 3 (three) times daily. 12/09/20  Yes [provider]  carbidopa-levodopa-entacapone (STALEVO) 50-200-200 MG tablet Take 1 tablet by mouth in the morning and at bedtime. 09/05/21  Yes Jerrol Banana., MD  Cyanocobalamin (VITAMIN B-12) 2500 MCG SUBL Place under the tongue daily.   Yes [provider]  Omega-3 Fatty Acids (OMEGA 3 PO) Take 1,400 mg by mouth daily.   Yes [provider]  omeprazole (PRILOSEC) 20 MG capsule Take 1 capsule (20 mg total) by mouth daily. 09/05/21  Yes Jerrol Banana., MD  Vitamin D, Ergocalciferol, (DRISDOL) 1.25 MG (50000 UT) CAPS capsule Take 1 capsule (50,000 Units total) by mouth every 7 (seven) days. 10/06/19  Yes Jerrol Banana., MD    Allergies as of 09/06/2021 - Review Complete 09/05/2021  Allergen Reaction Noted   Donepezil Nausea Only 12/20/2017   Penicillins  06/14/2009    Family History  Problem Relation Age of Onset   Hypertension Mother    Heart disease Father    Hypertension Sister    Hypertension Brother    Heart disease Other    Prostate cancer Neg Hx    Kidney cancer Neg Hx    Colon cancer Neg Hx    Pancreatic cancer Neg Hx    Breast cancer Neg Hx     Social History   Socioeconomic History   Marital status: Married    Spouse name: Not on file   Number of children: 3   Years of education: Not on file   Highest education level: Bachelor's degree (e.g., BA, AB, BS)  Occupational History    Occupation: GOLF TEACHER    Employer: [employer]    Comment: retired  Tobacco Use   Smoking status: Never   Smokeless tobacco: Never  Vaping Use   Vaping Use: Never used  Substance and Sexual Activity   Alcohol use: Yes    Alcohol/week: 21.0 standard drinks    Types: 21 Glasses of wine per week    Comment: 3 glasses of wine/day   Drug use: No   Sexual activity: Not Currently  Other Topics Concern   Not on file  Social History Narrative   Married   Gets regular exercise   Social Determinants of Health   Financial Resource Strain: Low Risk    Difficulty of Paying Living Expenses: Not hard at all  Food Insecurity: No Food Insecurity   Worried About Charity fundraiser in the Last Year: Never true   Arboriculturist in the Last Year: Never true  Transportation Needs: No Transportation Needs   Lack of Transportation (Medical): No   Lack of Transportation (Non-Medical): No  Physical Activity: Sufficiently Active   Days of Exercise per Week: 6 days   Minutes of Exercise per Session: 30 min  Stress: No Stress Concern Present   Feeling of Stress : Not at all  Social Connections: Socially Integrated   Frequency of Communication with Friends and Family: Three times a week   Frequency of Social Gatherings with Friends and Family: More than three times a week   Attends Religious Services: More than 4 times per year   Active Member of Clubs or Organizations: Yes   Attends Music therapist: More than 4 times per year   Marital Status: Married  Human resources officer Violence: Not At Risk   Fear of Current or Ex-Partner: No   Emotionally Abused: No   Physically Abused: No   Sexually Abused: No    Review of Systems: See HPI, otherwise negative ROS  Physical Exam: BP 111/62   Pulse (!) 59   Temp (!) 97.5 F (36.4 C) (Temporal)   Ht 5\' 8"  (1.727 m)   Wt 72.6 kg   SpO2 100%   BMI 24.33 kg/m  General:   Alert,  pleasant and cooperative in NAD Head:   Normocephalic and atraumatic. Lungs:  Clear to auscultation.    Heart:  Regular rate and rhythm.   Impression/Plan: [name] Liming. is here for ophthalmic surgery.  Risks, benefits, limitations, and alternatives regarding ophthalmic surgery have been reviewed with the patient.  Questions have been answered.  All parties agreeable.   Leandrew Koyanagi, MD  10/25/2021, 10:24 AM

## 2021-10-25 NOTE — Anesthesia Procedure Notes (Signed)
Procedure Name: MAC Date/Time: 10/25/2021 11:21 AM Performed by: Cameron Ali, CRNA Pre-anesthesia Checklist: Patient identified, Emergency Drugs available, Suction available, Timeout performed and Patient being monitored Patient Re-evaluated:Patient Re-evaluated prior to induction Oxygen Delivery Method: Nasal cannula Placement Confirmation: positive ETCO2

## 2021-10-26 ENCOUNTER — Encounter: Payer: Self-pay | Admitting: Ophthalmology

## 2021-10-27 IMAGING — US US ABDOMEN COMPLETE
1 series · 14 of 25 positions shown · non-contrast
Comparison: None.

CLINICAL DATA: Nine months of intermittent abdominal pain.

EXAM:
ABDOMEN ULTRASOUND COMPLETE

[Series 1: us abdomen complete · 14 of 88 slices shown]
[im 1/88]
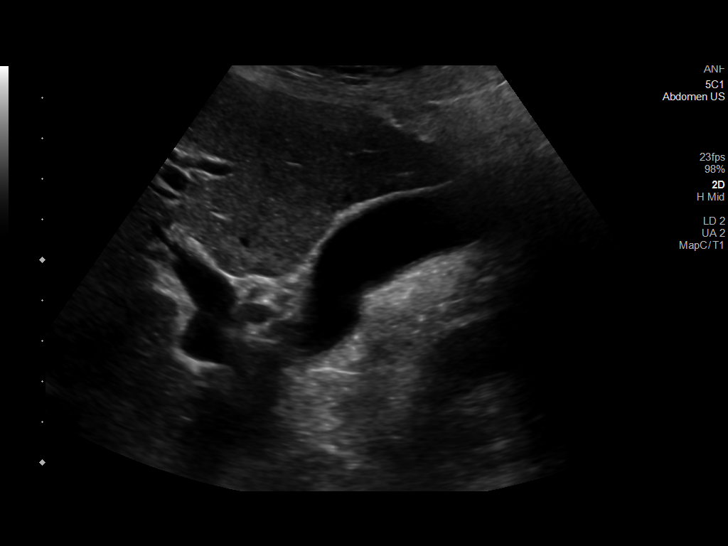
[im 8/88]
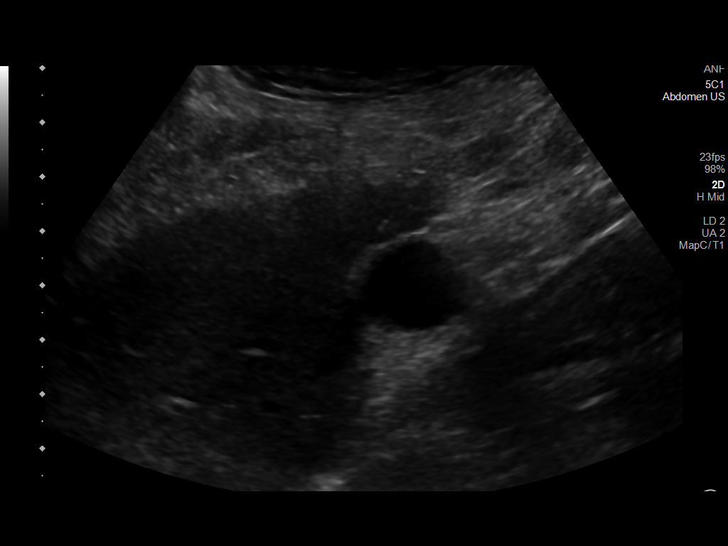
[im 15/88]
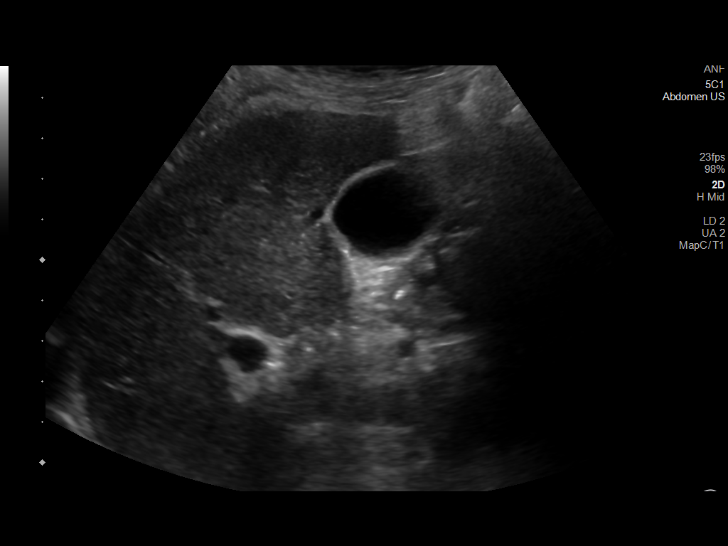
[im 22/88]
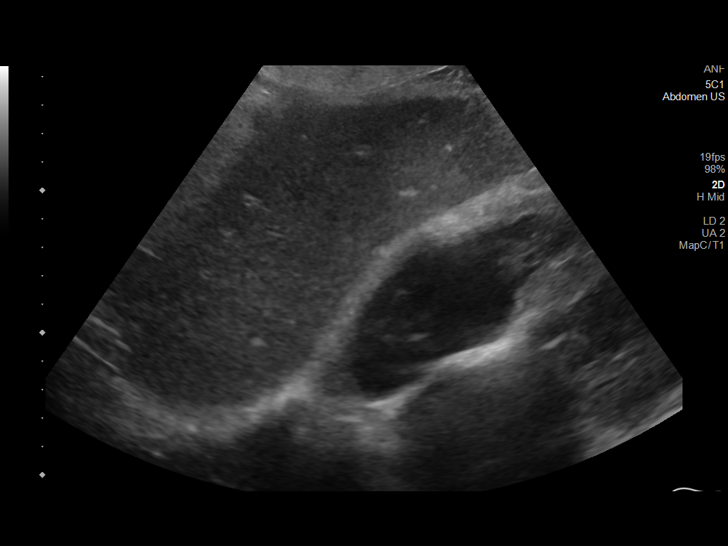
[im 30/88]
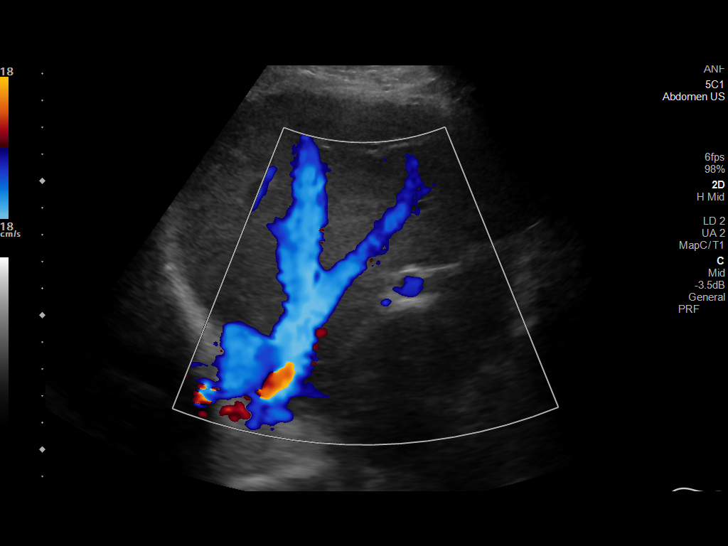
[im 33/88]
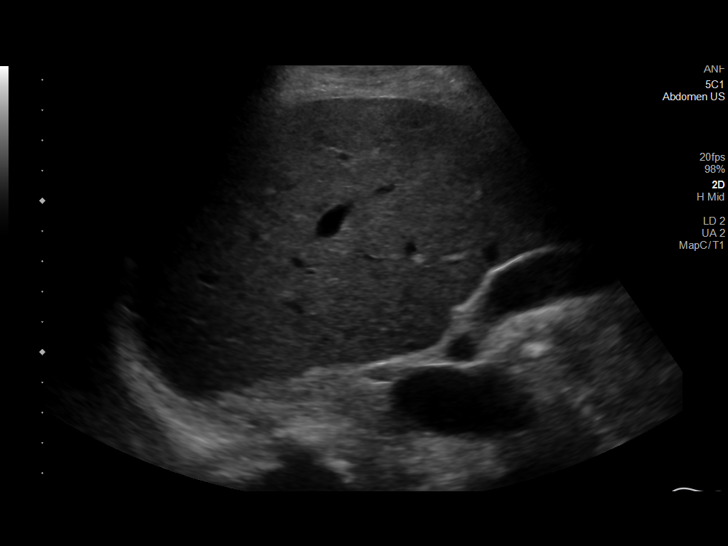
[im 40/88]
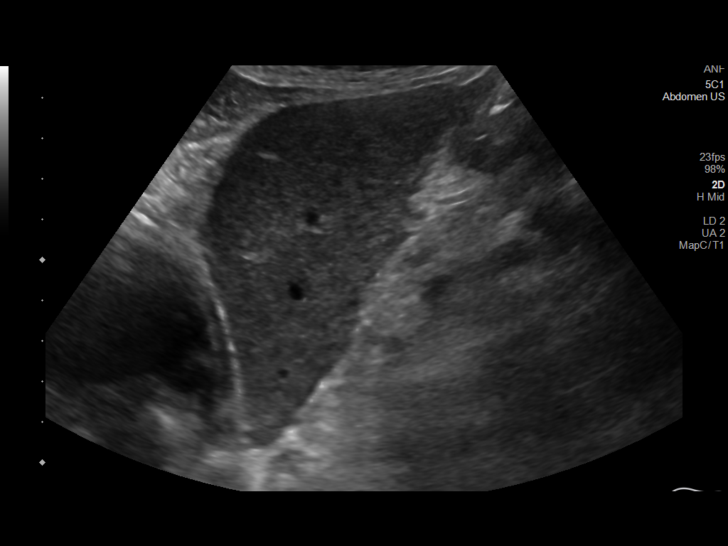
[im 48/88]
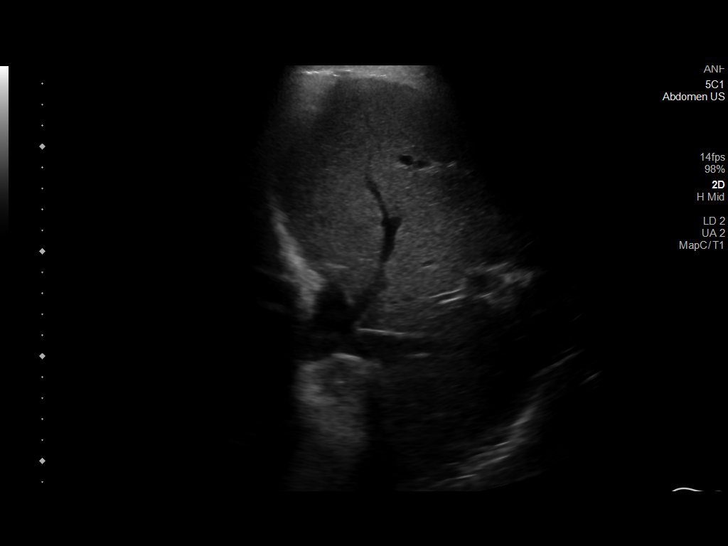
[im 55/88]
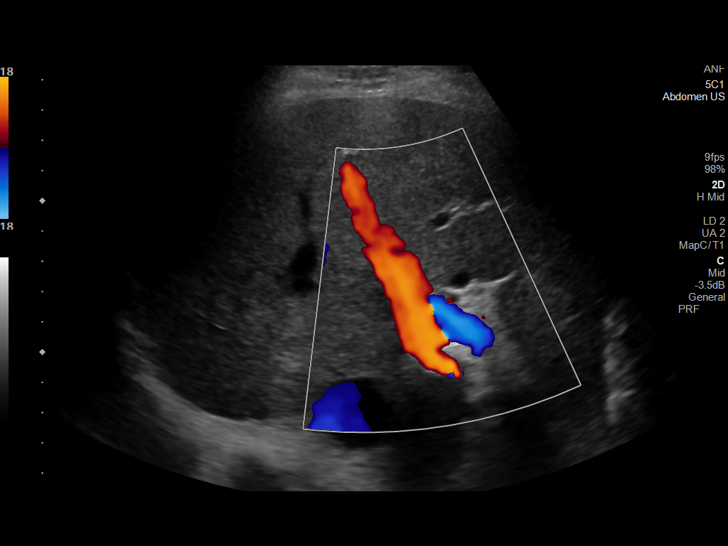
[im 59/88]
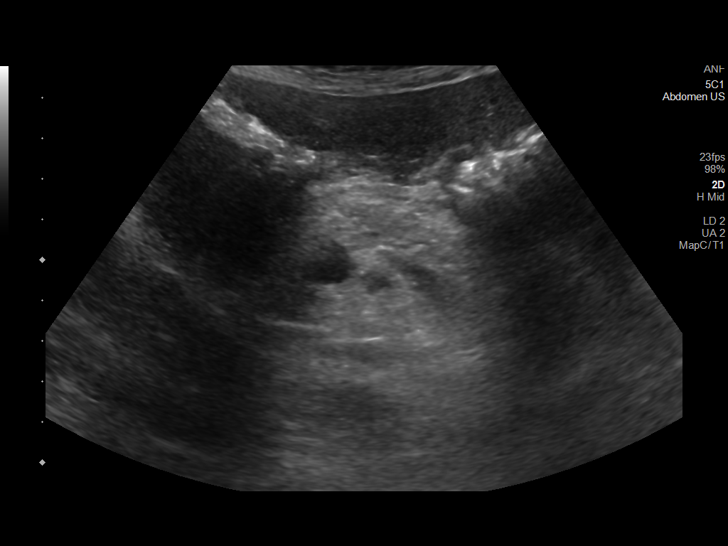
[im 66/88]
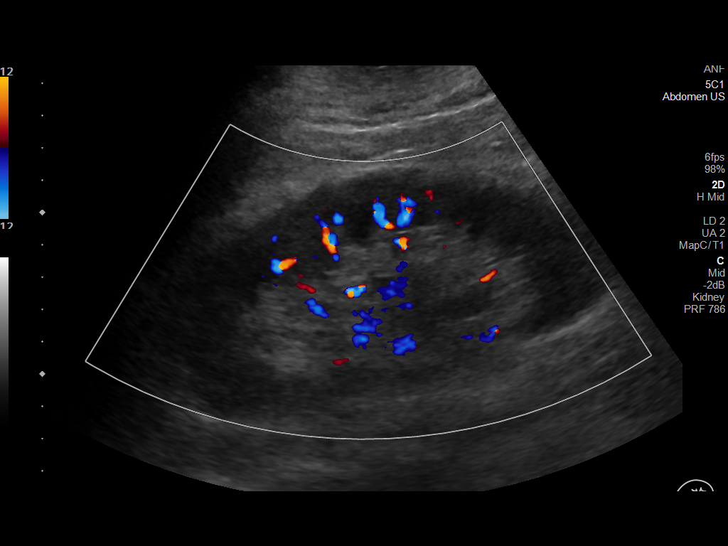
[im 73/88]
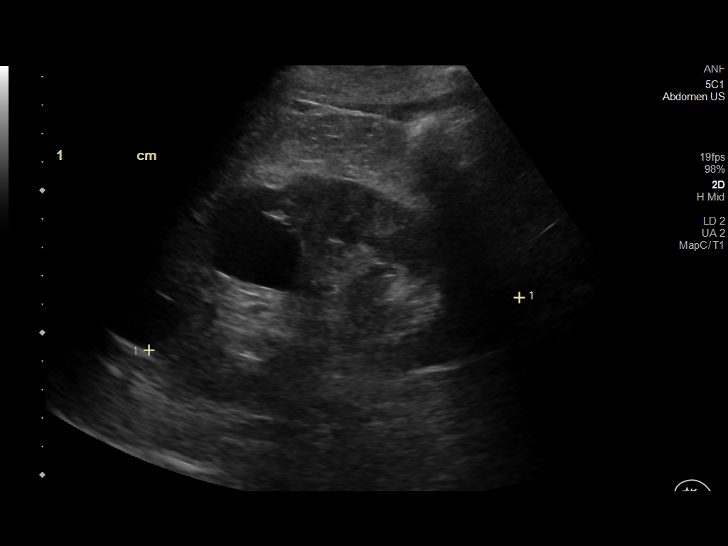
[im 80/88]
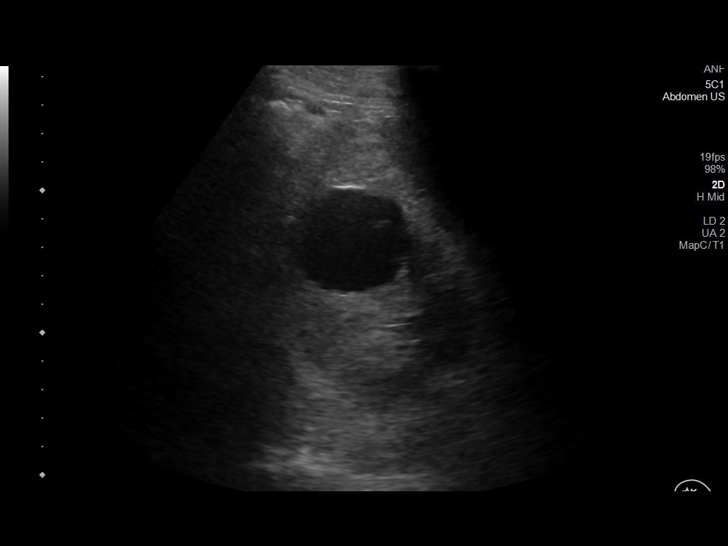
[im 88/88]
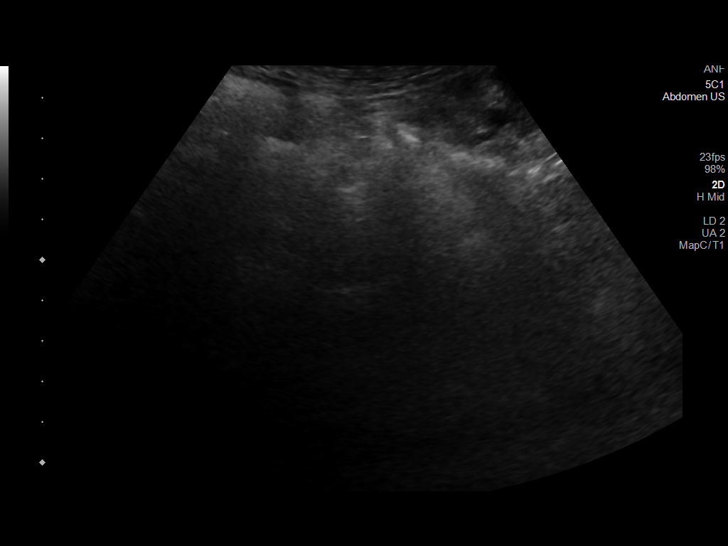

[14 of 25 positions shown; findings below may reference images not displayed]

FINDINGS: Gallbladder: No gallstones or wall thickening visualized. No
sonographic Murphy sign noted by sonographer.

Common bile duct: Diameter: 4 mm

Liver: No focal lesion identified. Within normal limits in
parenchymal echogenicity. Portal vein is patent on color Doppler
imaging with normal direction of blood flow towards the liver.

IVC: No abnormality visualized.

Pancreas: Visualized portion unremarkable.

Spleen: Size and appearance within normal limits.

Right Kidney: Length: 13.6 cm. Echogenicity within normal limits. No
mass or hydronephrosis visualized.

Left Kidney: Length: 13.2 cm. Echogenicity within normal limits.
Multiple renal cysts measuring up to 3.9 cm. No solid mass or
hydronephrosis visualized.

Abdominal aorta: No aneurysm visualized.

Other findings: None.
IMPRESSION: Unremarkable abdominal ultrasound. Specifically no findings to
explain patient's abdominal pain cyst.

## 2021-11-23 DIAGNOSIS — K5900 Constipation, unspecified: Secondary | ICD-10-CM | POA: Diagnosis not present

## 2021-11-23 DIAGNOSIS — R109 Unspecified abdominal pain: Secondary | ICD-10-CM | POA: Diagnosis not present

## 2021-12-01 ENCOUNTER — Other Ambulatory Visit: Payer: Self-pay | Admitting: Cardiovascular Disease

## 2021-12-01 DIAGNOSIS — Z961 Presence of intraocular lens: Secondary | ICD-10-CM | POA: Diagnosis not present

## 2021-12-01 NOTE — Telephone Encounter (Signed)
LVM to schedule appt

## 2021-12-01 NOTE — Telephone Encounter (Signed)
Please schedule overdue 12 month F/U appointment. Thank you! 

## 2021-12-07 ENCOUNTER — Other Ambulatory Visit: Payer: Self-pay | Admitting: Cardiovascular Disease

## 2022-01-02 ENCOUNTER — Encounter: Payer: Self-pay | Admitting: Cardiovascular Disease

## 2022-01-02 ENCOUNTER — Ambulatory Visit: Payer: Medicare HMO | Admitting: Cardiovascular Disease

## 2022-01-02 ENCOUNTER — Other Ambulatory Visit: Payer: Self-pay

## 2022-01-02 VITALS — BP 140/82 | HR 63 | Ht 68.0 in | Wt 159.2 lb

## 2022-01-02 DIAGNOSIS — I10 Essential (primary) hypertension: Secondary | ICD-10-CM

## 2022-01-02 DIAGNOSIS — I6523 Occlusion and stenosis of bilateral carotid arteries: Secondary | ICD-10-CM | POA: Diagnosis not present

## 2022-01-02 DIAGNOSIS — I739 Peripheral vascular disease, unspecified: Secondary | ICD-10-CM | POA: Diagnosis not present

## 2022-01-02 DIAGNOSIS — I350 Nonrheumatic aortic (valve) stenosis: Secondary | ICD-10-CM

## 2022-01-02 DIAGNOSIS — I2510 Atherosclerotic heart disease of native coronary artery without angina pectoris: Secondary | ICD-10-CM

## 2022-01-02 DIAGNOSIS — I9589 Other hypotension: Secondary | ICD-10-CM

## 2022-01-02 DIAGNOSIS — I25118 Atherosclerotic heart disease of native coronary artery with other forms of angina pectoris: Secondary | ICD-10-CM

## 2022-01-02 MED ORDER — AMLODIPINE BESYLATE 5 MG PO TABS: 5.0000 mg | ORAL_TABLET | Freq: Every day | ORAL | 3 refills | Status: DC

## 2022-01-02 MED ORDER — ATORVASTATIN CALCIUM 80 MG PO TABS: ORAL_TABLET | ORAL | 3 refills | Status: DC

## 2022-01-02 MED ORDER — BENAZEPRIL HCL 40 MG PO TABS: 40.0000 mg | ORAL_TABLET | Freq: Every day | ORAL | 3 refills | Status: DC

## 2022-01-02 NOTE — Patient Instructions (Addendum)
Medication Instructions:  No changes  If you need a refill on your cardiac medications before your next appointment, please call your pharmacy.   Lab work: No new labs needed  Testing/Procedures: Your physician has requested that you have an echocardiogram. Echocardiography is a painless test that uses sound waves to create images of your heart. It provides your doctor with information about the size and shape of your heart and how well your hearts chambers and valves are working. This procedure takes approximately one hour. There are no restrictions for this procedure.  There is a possibility that an IV may need to be started during your test to inject an image enhancing agent. This is done to obtain more optimal pictures of your heart. Therefore we ask that you do at least drink some water prior to coming in to hydrate your veins.    Follow-Up: At [facility], you and your health needs are our priority.  As part of our continuing mission to provide you with exceptional heart care, we have created designated Provider Care Teams.  These Care Teams include your primary Cardiologist (physician) and Advanced Practice Providers (APPs -  Physician Assistants and Nurse Practitioners) who all work together to provide you with the care you need, when you need it.  You will need a follow up appointment in 12 months  Providers on your designated Care Team:   Murray Hodgkins, NP Christell Faith, PA-C Cadence Kathlen Mody, [place]  COVID-19 Vaccine Information can be found at: [email or web address].co.uk For questions related to vaccine distribution or appointments, please email vaccine@[email or web address] .com or call [phone].

## 2022-01-02 NOTE — Progress Notes (Signed)
Cardiology Office Note  Date:  01/02/2022   ID:  [name] Liming., DOB [birth year], MRN [id number]  PCP:  Jerrol Banana., MD   Chief Complaint  Patient presents with   12 month follow up     Patient c/o weakness and fatigue. Medications reviewed by the patient verbally.     HPI:  [name] is a pleasant 76 year old gentleman with a history of  bilateral mild carotid disease,  prior MI in 1994 with disease in the distal RCA with medical management  stress test in January 2012 with fixed inferior wall defect,   history of Parkinson's Presents for routine followup of his coronary artery disease and PAD, mild aortic valve stenosis  In follow-up today no new cardiac issues  Feels very tired Waking 3 to 5 x a night to urinate Goes to be 8 pm, Wakes up feeling tired  Continues to exercise daily on his treadmill at the [facility] No anginal symptoms when exercising  Echo 07/2019 reviewed normal cardiac function Aortic valve is heavily calcified with mild stenosis Mean gradient 10  History of prostate cancer completed XRT, 5 weeks Followed by urology  Sees Dr. Shah,neurology parkinsons   EKG personally reviewed by myself on todays visit  shows normal sinus rhythm with rate 63 bpm,  Old inferior MI    PMH:   has a past medical history of Acute myocardial infarction of other inferior wall, episode of care unspecified (04/1993), Cancer ([place]), Coronary atherosclerosis of native coronary artery, History of shingles, Occlusion and stenosis of carotid artery without mention of cerebral infarction, Other and unspecified hyperlipidemia, Parkinson's disease, Peripheral vascular disease, unspecified ([place]), Precancerous skin lesion, Prostate cancer ([place]) (10/2016), Shingles (07/2014), Squamous cell cancer of skin of nose, Unspecified cerebral artery occlusion with cerebral infarction (2006), and Unspecified essential hypertension.  PSH:    Past Surgical History:  Procedure Laterality  Date   BIOPSY PROSTATE  11/12/2017   CATARACT EXTRACTION W/PHACO Left 10/11/2021   Procedure: CATARACT EXTRACTION PHACO AND INTRAOCULAR LENS PLACEMENT ([place]) LEFT;  Surgeon: Leandrew Koyanagi, MD;  Location: [place];  Service: Ophthalmology;  Laterality: Left;  5.15 01:01.6   CATARACT EXTRACTION W/PHACO Right 10/25/2021   Procedure: CATARACT EXTRACTION PHACO AND INTRAOCULAR LENS PLACEMENT ([place]) RIGHT;  Surgeon: Leandrew Koyanagi, MD;  Location: [place];  Service: Ophthalmology;  Laterality: Right;  5.56 00:50.6   COLONOSCOPY WITH PROPOFOL N/A 02/11/2017   Procedure: COLONOSCOPY WITH PROPOFOL;  Surgeon: Manya Silvas, MD;  Location: [facility] ENDOSCOPY;  Service: Endoscopy;  Laterality: N/A;   HERNIA REPAIR  02/17/15   right inguinal hernia , laparoscopic placement of Bard 3-D max mesh   HERNIA REPAIR  3/316   ventral hernia, umbilical, open repair   MOHS SURGERY     SKIN BIOPSY     skin cancer   TRANSRECTAL ULTRASOUND  11/12/2017    Current Outpatient Medications  Medication Sig Dispense Refill   amLODipine (NORVASC) 5 MG tablet Take 1 tablet (5 mg total) by mouth daily. 90 tablet 3   aspirin 81 MG EC tablet Take 81 mg by mouth daily.      atorvastatin (LIPITOR) 80 MG tablet TAKE 1 TABLET BY MOUTH ONCE DAILY. PLEASE SCHEDULE OFFICE VISIT FOR FURTHER REFILLS. THANK YOU! 30 tablet 0   B Complex Vitamins (VITAMIN B COMPLEX PO) Take by mouth daily.     benazepril (LOTENSIN) 40 MG tablet TAKE 1 TABLET BY MOUTH EVERY DAY 90 tablet 0   carbidopa-levodopa (SINEMET IR) 25-100  MG tablet Take 2 tablets by mouth 3 (three) times daily.     carbidopa-levodopa-entacapone (STALEVO) 50-200-200 MG tablet Take 1 tablet by mouth in the morning and at bedtime. 180 tablet 3   Cyanocobalamin (VITAMIN B-12) 2500 MCG SUBL Place under the tongue daily.     Omega-3 Fatty Acids (OMEGA 3 PO) Take 1,400 mg by mouth daily.     omeprazole (PRILOSEC) 20 MG capsule Take 1 capsule (20 mg total) by  mouth daily. 90 capsule 1   Vitamin D, Ergocalciferol, (DRISDOL) 1.25 MG (50000 UT) CAPS capsule Take 1 capsule (50,000 Units total) by mouth every 7 (seven) days. 4 capsule 12   No current facility-administered medications for this visit.     Allergies:   Donepezil and Penicillins   Social History:  The patient  reports that he has never smoked. He has never used smokeless tobacco. He reports current alcohol use of about 21.0 standard drinks per week. He reports that he does not use drugs.   Family History:   family history includes Heart disease in his father and another family member; Hypertension in his brother, mother, and sister.    Review of Systems: Review of Systems  Constitutional: Negative.   Respiratory: Negative.    Cardiovascular: Negative.   Gastrointestinal: Negative.   Musculoskeletal: Negative.   Neurological:  Positive for tremors.  Psychiatric/Behavioral:  Positive for memory loss.   All other systems reviewed and are negative.  PHYSICAL EXAM: VS:  BP 140/82 (BP Location: Left Arm, Patient Position: Sitting, Cuff Size: Normal)    Pulse 63    Ht 5\' 8"  (1.727 m)    Wt 159 lb 4 oz (72.2 kg)    SpO2 97%    BMI 24.21 kg/m  , BMI Body mass index is 24.21 kg/m. Constitutional:  oriented to person, place, and time. No distress.  HENT:  Head: Grossly normal Eyes:  no discharge. No scleral icterus.  Neck: No JVD, no carotid bruits  Cardiovascular: Regular rate and rhythm, 2/6 systolic ejection murmur right sternal border Pulmonary/Chest: Clear to auscultation bilaterally, no wheezes or rails Abdominal: Soft.  no distension.  no tenderness.  Musculoskeletal: Normal range of motion Neurological:  normal muscle tone. Coordination normal. No atrophy Skin: Skin warm and dry Psychiatric: normal affect, pleasant  Recent Labs: 04/14/2021: ALT 12; BUN 15; Creatinine, Ser 0.68; Hemoglobin 14.4; Platelets 230; Potassium 4.9; Sodium 138; TSH 0.786    Lipid Panel Lab  Results  Component Value Date   CHOL 186 11/02/2020   HDL 73 11/02/2020   LDLCALC 101 (H) 11/02/2020   TRIG 63 11/02/2020    Wt Readings from Last 3 Encounters:  01/02/22 159 lb 4 oz (72.2 kg)  10/25/21 160 lb (72.6 kg)  10/11/21 160 lb 6.4 oz (72.8 kg)     ASSESSMENT AND PLAN:  Atherosclerosis of native coronary artery of native heart with stable angina pectoris (HCC) Currently with no symptoms of angina. No further workup at this time. Continue current medication regimen. Fatigue likely unrelated to ischemia  Fatigue, chronic Poor sleep hygiene, nocturia, Parkinson's  Essential hypertension Blood pressure is well controlled on today's visit. No changes made to the medications.  Mixed hyperlipidemia On lipitor Cholesterol at goal  Bilateral carotid artery stenosis  carotid ultrasound 10/2018, less than 39% disease bilaterally,  No bruits on exam  PARKINSON'S DISEASE memory loss, tremor, balance , stable Followed by neurology Chronic fatigue, slow gait  Aortic valve stenosis Mild aortic valve stenosis on echocardiogram 3 years ago,  recommended repeat    Total encounter time more than 25 minutes  Greater than 50% was spent in counseling and coordination of care with the patient    No orders of the defined types were placed in this encounter.    Signed, Esmond Plants, M.D., Ph.D. 01/02/2022  [place], [place]

## 2022-01-05 DIAGNOSIS — L821 Other seborrheic keratosis: Secondary | ICD-10-CM | POA: Diagnosis not present

## 2022-01-05 DIAGNOSIS — Z85820 Personal history of malignant melanoma of skin: Secondary | ICD-10-CM | POA: Diagnosis not present

## 2022-01-05 DIAGNOSIS — Z85828 Personal history of other malignant neoplasm of skin: Secondary | ICD-10-CM | POA: Diagnosis not present

## 2022-01-05 DIAGNOSIS — D485 Neoplasm of uncertain behavior of skin: Secondary | ICD-10-CM | POA: Diagnosis not present

## 2022-01-05 DIAGNOSIS — L570 Actinic keratosis: Secondary | ICD-10-CM | POA: Diagnosis not present

## 2022-01-05 DIAGNOSIS — Z08 Encounter for follow-up examination after completed treatment for malignant neoplasm: Secondary | ICD-10-CM | POA: Diagnosis not present

## 2022-01-05 DIAGNOSIS — L739 Follicular disorder, unspecified: Secondary | ICD-10-CM | POA: Diagnosis not present

## 2022-01-11 ENCOUNTER — Ambulatory Visit (INDEPENDENT_AMBULATORY_CARE_PROVIDER_SITE_OTHER): Payer: Medicare HMO

## 2022-01-11 DIAGNOSIS — Z0000 Encounter for general adult medical examination without abnormal findings: Secondary | ICD-10-CM | POA: Diagnosis not present

## 2022-01-11 NOTE — Progress Notes (Signed)
Virtual Visit via Telephone Note  I connected with  [name] [name]. on 01/11/22 at  3:00 PM EST by telephone and verified that I am speaking with the correct person using two identifiers.  Location: Patient: home Provider: [facility] Persons participating in the virtual visit: [place]   I discussed the limitations, risks, security and privacy concerns of performing an evaluation and management service by telephone and the availability of in person appointments. The patient expressed understanding and agreed to proceed.  Interactive audio and video telecommunications were attempted between this nurse and patient, however failed, due to patient having technical difficulties OR patient did not have access to video capability.  We continued and completed visit with audio only.  Some vital signs may be absent or patient reported.   Dionisio David, LPN  Subjective:   [name] [name]. is a 76 y.o. male who presents for Medicare Annual/Subsequent preventive examination.  Review of Systems           Objective:    There were no vitals filed for this visit. There is no height or weight on file to calculate BMI.  Advanced Directives 10/25/2021 10/11/2021 01/05/2021 12/30/2019 12/23/2018 11/06/2018 12/25/2017  Does Patient Have a Medical Advance Directive? Yes Yes Yes Yes No No No  Type of Paramedic of [place];Living will [place];Living will [place];Living will [place];Living will - - -  Does patient want to make changes to medical advance directive? No - Patient declined No - Patient declined - - - - -  Copy of [place] in Chart? Yes - validated most recent copy scanned in chart (See row information) No - copy requested No - copy requested No - copy requested - - -  Would patient like information on creating a medical advance directive? - - - - Yes (MAU/Ambulatory/Procedural  Areas - Information given) No - Patient declined No - Patient declined    Current Medications (verified) Outpatient Encounter Medications as of 01/11/2022  Medication Sig   amLODipine (NORVASC) 5 MG tablet Take 1 tablet (5 mg total) by mouth daily.   aspirin 81 MG EC tablet Take 81 mg by mouth daily.    atorvastatin (LIPITOR) 80 MG tablet TAKE 1 TABLET BY MOUTH ONCE DAILY. PLEASE SCHEDULE OFFICE VISIT FOR FURTHER REFILLS. THANK YOU!   B Complex Vitamins (VITAMIN B COMPLEX PO) Take by mouth daily.   benazepril (LOTENSIN) 40 MG tablet Take 1 tablet (40 mg total) by mouth daily.   carbidopa-levodopa (SINEMET CR) 50-200 MG tablet Take 1 tablet by mouth 2 (two) times daily.   carbidopa-levodopa (SINEMET IR) 25-100 MG tablet Take 2 tablets by mouth 3 (three) times daily.   carbidopa-levodopa-entacapone (STALEVO) 50-200-200 MG tablet Take 1 tablet by mouth in the morning and at bedtime.   Cyanocobalamin (VITAMIN B-12) 2500 MCG SUBL Place under the tongue daily.   Omega-3 Fatty Acids (OMEGA 3 PO) Take 1,400 mg by mouth daily.   omeprazole (PRILOSEC) 20 MG capsule Take 1 capsule (20 mg total) by mouth daily.   Vitamin D, Ergocalciferol, (DRISDOL) 1.25 MG (50000 UT) CAPS capsule Take 1 capsule (50,000 Units total) by mouth every 7 (seven) days.   No facility-administered encounter medications on file as of 01/11/2022.    Allergies (verified) Donepezil and Penicillins   History: Past Medical History:  Diagnosis Date   Acute myocardial infarction of other inferior wall, episode of care unspecified 04/1993  Cancer ([place])    skin / SCC   Coronary atherosclerosis of native coronary artery    History of shingles    waist area/right back side   Occlusion and stenosis of carotid artery without mention of cerebral infarction    Other and unspecified hyperlipidemia    Severe   Parkinson's disease    Peripheral vascular disease, unspecified ([place])    Precancerous skin lesion    Prostate cancer ([place])  10/2016   low risk   Shingles 07/2014   Squamous cell cancer of skin of nose    Unspecified cerebral artery occlusion with cerebral infarction 2006   CVA   Unspecified essential hypertension    Past Surgical History:  Procedure Laterality Date   BIOPSY PROSTATE  11/12/2017   CATARACT EXTRACTION W/PHACO Left 10/11/2021   Procedure: CATARACT EXTRACTION PHACO AND INTRAOCULAR LENS PLACEMENT ([place]) LEFT;  Surgeon: Leandrew Koyanagi, MD;  Location: [place];  Service: Ophthalmology;  Laterality: Left;  5.15 01:01.6   CATARACT EXTRACTION W/PHACO Right 10/25/2021   Procedure: CATARACT EXTRACTION PHACO AND INTRAOCULAR LENS PLACEMENT ([place]) RIGHT;  Surgeon: Leandrew Koyanagi, MD;  Location: [place];  Service: Ophthalmology;  Laterality: Right;  5.56 00:50.6   COLONOSCOPY WITH PROPOFOL N/A 02/11/2017   Procedure: COLONOSCOPY WITH PROPOFOL;  Surgeon: Manya Silvas, MD;  Location: [facility] ENDOSCOPY;  Service: Endoscopy;  Laterality: N/A;   HERNIA REPAIR  02/17/15   right inguinal hernia , laparoscopic placement of [place] 3-D max mesh   HERNIA REPAIR  3/316   ventral hernia, umbilical, open repair   MOHS SURGERY     SKIN BIOPSY     skin cancer   TRANSRECTAL ULTRASOUND  11/12/2017   Family History  Problem Relation Age of Onset   Hypertension Mother    Heart disease Father    Hypertension Sister    Hypertension Brother    Heart disease Other    Prostate cancer Neg Hx    Kidney cancer Neg Hx    Colon cancer Neg Hx    Pancreatic cancer Neg Hx    Breast cancer Neg Hx    Social History   Socioeconomic History   Marital status: Married    Spouse name: Not on file   Number of children: 3   Years of education: Not on file   Highest education level: Bachelor's degree (e.g., BA, AB, BS)  Occupational History   Occupation: GOLF TEACHER    Employer: [employer]    Comment: retired  Tobacco Use   Smoking status: Never   Smokeless tobacco: Never  Vaping Use    Vaping Use: Never used  Substance and Sexual Activity   Alcohol use: Yes    Alcohol/week: 21.0 standard drinks    Types: 21 Glasses of wine per week    Comment: 3 glasses of wine/day   Drug use: No   Sexual activity: Not Currently  Other Topics Concern   Not on file  Social History Narrative   Married   Gets regular exercise   Social Determinants of Health   Financial Resource Strain: Not on file  Food Insecurity: Not on file  Transportation Needs: Not on file  Physical Activity: Not on file  Stress: Not on file  Social Connections: Not on file    Tobacco Counseling Counseling given: Not Answered   Clinical Intake:  Pre-visit preparation completed: Yes  Pain : No/denies pain     Nutritional Risks: None Diabetes: No  How often do you need to  have someone help you when you read instructions, pamphlets, or other written materials from your doctor or pharmacy?: 1 - Never  Diabetic?no  Interpreter Needed?: No  Information entered by :: Kirke Shaggy, LPN   Activities of Daily Living In your present state of health, do you have any difficulty performing the following activities: 10/25/2021 10/11/2021  Hearing? N N  Vision? N N  Difficulty concentrating or making decisions? N N  Walking or climbing stairs? N N  Dressing or bathing? N N  Some recent data might be hidden    Patient Care Team: Jerrol Banana., MD as PCP - General (Family Medicine) Rockey Situ, Kathlene November, MD as Consulting Physician (Cardiology) Vladimir Crofts, MD as Consulting Physician (Neurology) Alexis Frock, MD as Consulting Physician (Urology) Tyler Pita, MD as Consulting Physician (Radiation Oncology) Oneta Rack, MD (Dermatology) Leandrew Koyanagi, MD as Referring Physician (Ophthalmology)  Indicate any recent Medical Services you may have received from other than [facility] providers in the past year (date may be approximate).     Assessment:   This is a routine  wellness examination for [place].  Hearing/Vision screen No results found.  Dietary issues and exercise activities discussed:     Goals Addressed   None    Depression Screen PHQ 2/9 Scores 01/05/2021 12/30/2019 04/09/2019 12/23/2018 06/09/2018 09/19/2017 09/18/2016  PHQ - 2 Score 0 0 0 0 0 0 0    Fall Risk Fall Risk  01/05/2021 12/30/2019 04/09/2019 12/23/2018 06/09/2018  Falls in the past year? 1 1 0 0 No  Number falls in past yr: 0 1 - 0 -  Injury with Fall? 0 0 - 0 -  Risk for fall due to : - - - - -  Risk for fall due to: Comment - - - - -  Follow up - Falls prevention discussed - - -    FALL RISK PREVENTION PERTAINING TO THE HOME:  Any stairs in or around the home? Yes  If so, are there any without handrails? No  Home free of loose throw rugs in walkways, pet beds, electrical cords, etc? Yes  Adequate lighting in your home to reduce risk of falls? Yes   ASSISTIVE DEVICES UTILIZED TO PREVENT FALLS:  Life alert? No  Use of a cane, walker or w/c? No  Grab bars in the bathroom? Yes  Shower chair or bench in shower? No  Elevated toilet seat or a handicapped toilet? No    Cognitive Function:Normal cognitive status assessed by direct observation by this Nurse Health Advisor. No abnormalities found.    MMSE - Mini Mental State Exam 10/06/2019 09/26/2017  Orientation to time 5 5  Orientation to Place 5 5  Registration 3 3  Attention/ Calculation 5 5  Recall 3 3  Language- name 2 objects 2 2  Language- repeat 1 1  Language- follow 3 step command 3 3  Language- read & follow direction 1 1  Write a sentence 1 1  Copy design 1 1  Total score 30 30     6CIT Screen 12/23/2018 09/18/2016  What Year? 0 points 0 points  What month? 0 points 0 points  What time? 0 points 3 points  Count back from 20 0 points 0 points  Months in reverse 0 points 0 points  Repeat phrase 0 points 0 points  Total Score 0 3    Immunizations Immunization History  Administered Date(s) Administered    Fluad Quad(high Dose 65+) 10/06/2019   Influenza, High Dose  Seasonal PF 10/07/2018   Moderna Sars-Covid-2 Vaccination 12/24/2019, 02/05/2020, 12/27/2020   Pneumococcal Conjugate-13 04/24/2016   Pneumococcal Polysaccharide-23 10/07/2018   Tdap 04/24/2016    TDAP status: Up to date  Flu Vaccine status: Due, Education has been provided regarding the importance of this vaccine. Advised may receive this vaccine at local pharmacy or Health Dept. Aware to provide a copy of the vaccination record if obtained from local pharmacy or Health Dept. Verbalized acceptance and understanding.  Pneumococcal vaccine status: Up to date  Covid-19 vaccine status: Completed vaccines  Qualifies for Shingles Vaccine? Yes   Zostavax completed No   Shingrix Completed?: Yes  Screening Tests Health Maintenance  Topic Date Due   Hepatitis C Screening  Never done   Zoster Vaccines- Shingrix (1 of 2) Never done   COVID-19 Vaccine (4 - Booster for Moderna series) 02/21/2021   INFLUENZA VACCINE  08/15/2022 (Originally 07/17/2021)   COLONOSCOPY (Pts 45-28yrs Insurance coverage will need to be confirmed)  02/11/2022   TETANUS/TDAP  04/24/2026   Pneumonia Vaccine 42+ Years old  Completed   HPV VACCINES  Aged Out    Health Maintenance  Health Maintenance Due  Topic Date Due   Hepatitis C Screening  Never done   Zoster Vaccines- Shingrix (1 of 2) Never done   COVID-19 Vaccine (4 - Booster for Moderna series) 02/21/2021    Colorectal cancer screening: Type of screening: Colonoscopy. Completed 02/11/2017. Repeat every 5 years- declined referral, wants to think about it  Lung Cancer Screening: (Low Dose CT Chest recommended if Age 62-80 years, 30 pack-year currently smoking OR have quit w/in 15years.) does not qualify.    Additional Screening:  Hepatitis C Screening: does qualify; Completed no  Vision Screening: Recommended annual ophthalmology exams for early detection of glaucoma and other disorders of the  eye. Is the patient up to date with their annual eye exam?  Yes  Who is the provider or what is the name of the office in which the patient attends annual eye exams? Dr.Brasington- [facility] If pt is not established with a provider, would they like to be referred to a provider to establish care? No .   Dental Screening: Recommended annual dental exams for proper oral hygiene  Community Resource Referral / Chronic Care Management: CRR required this visit?  No   CCM required this visit?  No      Plan:     I have personally reviewed and noted the following in the patients chart:   Medical and social history Use of alcohol, tobacco or illicit drugs  Current medications and supplements including opioid prescriptions. Patient is not currently taking opioid prescriptions. Functional ability and status Nutritional status Physical activity Advanced directives List of other physicians Hospitalizations, surgeries, and ER visits in previous 12 months Vitals Screenings to include cognitive, depression, and falls Referrals and appointments  In addition, I have reviewed and discussed with patient certain preventive protocols, quality metrics, and best practice recommendations. A written personalized care plan for preventive services as well as general preventive health recommendations were provided to patient.     Dionisio David, LPN   [id number]   Nurse Notes: none

## 2022-01-11 NOTE — Patient Instructions (Addendum)
[name] , Thank you for taking time to come for your Medicare Wellness Visit. I appreciate your ongoing commitment to your health goals. Please review the following plan we discussed and let me know if I can assist you in the future.   Screening recommendations/referrals: Colonoscopy: 02/11/17 Recommended yearly ophthalmology/optometry visit for glaucoma screening and checkup Recommended yearly dental visit for hygiene and checkup  Vaccinations: Influenza vaccine: due Pneumococcal vaccine: 10/07/18 Tdap vaccine: 04/24/16 Shingles vaccine: n/d   Covid-19: 12/24/19, 02/05/20, 12/27/20  Advanced directives: yes, requested copy  Conditions/risks identified: no  Next appointment: Follow up in one year for your annual wellness visit. 01/14/23 @ 3pm by phone  Preventive Care 65 Years and Older, Male Preventive care refers to lifestyle choices and visits with your health care provider that can promote health and wellness. What does preventive care include? A yearly physical exam. This is also called an annual well check. Dental exams once or twice a year. Routine eye exams. Ask your health care provider how often you should have your eyes checked. Personal lifestyle choices, including: Daily care of your teeth and gums. Regular physical activity. Eating a healthy diet. Avoiding tobacco and drug use. Limiting alcohol use. Practicing safe sex. Taking low doses of aspirin every day. Taking vitamin and mineral supplements as recommended by your health care provider. What happens during an annual well check? The services and screenings done by your health care provider during your annual well check will depend on your age, overall health, lifestyle risk factors, and family history of disease. Counseling  Your health care provider may ask you questions about your: Alcohol use. Tobacco use. Drug use. Emotional well-being. Home and relationship well-being. Sexual activity. Eating  habits. History of falls. Memory and ability to understand (cognition). Work and work Statistician. Screening  You may have the following tests or measurements: Height, weight, and BMI. Blood pressure. Lipid and cholesterol levels. These may be checked every 5 years, or more frequently if you are over 76 years old. Skin check. Lung cancer screening. You may have this screening every year starting at age 76 if you have a 30-pack-year history of smoking and currently smoke or have quit within the past 15 years. Fecal occult blood test (FOBT) of the stool. You may have this test every year starting at age 76. Flexible sigmoidoscopy or colonoscopy. You may have a sigmoidoscopy every 5 years or a colonoscopy every 10 years starting at age 76. Prostate cancer screening. Recommendations will vary depending on your family history and other risks. Hepatitis C blood test. Hepatitis B blood test. Sexually transmitted disease (STD) testing. Diabetes screening. This is done by checking your blood sugar (glucose) after you have not eaten for a while (fasting). You may have this done every 1-3 years. Abdominal aortic aneurysm (AAA) screening. You may need this if you are a current or former smoker. Osteoporosis. You may be screened starting at age 76 if you are at high risk. Talk with your health care provider about your test results, treatment options, and if necessary, the need for more tests. Vaccines  Your health care provider may recommend certain vaccines, such as: Influenza vaccine. This is recommended every year. Tetanus, diphtheria, and acellular pertussis (Tdap, Td) vaccine. You may need a Td booster every 10 years. Zoster vaccine. You may need this after age 67. Pneumococcal 13-valent conjugate (PCV13) vaccine. One dose is recommended after age 76. Pneumococcal polysaccharide (PPSV23) vaccine. One dose is recommended after age 71. Talk to your health  care provider about which screenings and  vaccines you need and how often you need them. This information is not intended to replace advice given to you by your health care provider. Make sure you discuss any questions you have with your health care provider. Document Released: 12/30/2015 Document Revised: 08/22/2016 Document Reviewed: 10/04/2015 Elsevier Interactive Patient Education  2017 [place] Prevention in the Home Falls can cause injuries. They can happen to people of all ages. There are many things you can do to make your home safe and to help prevent falls. What can I do on the outside of my home? Regularly fix the edges of walkways and driveways and fix any cracks. Remove anything that might make you trip as you walk through a door, such as a raised step or threshold. Trim any bushes or trees on the path to your home. Use bright outdoor lighting. Clear any walking paths of anything that might make someone trip, such as rocks or tools. Regularly check to see if handrails are loose or broken. Make sure that both sides of any steps have handrails. Any raised decks and porches should have guardrails on the edges. Have any leaves, snow, or ice cleared regularly. Use sand or salt on walking paths during winter. Clean up any spills in your garage right away. This includes oil or grease spills. What can I do in the bathroom? Use night lights. Install grab bars by the toilet and in the tub and shower. Do not use towel bars as grab bars. Use non-skid mats or decals in the tub or shower. If you need to sit down in the shower, use a plastic, non-slip stool. Keep the floor dry. Clean up any water that spills on the floor as soon as it happens. Remove soap buildup in the tub or shower regularly. Attach bath mats securely with double-sided non-slip rug tape. Do not have throw rugs and other things on the floor that can make you trip. What can I do in the bedroom? Use night lights. Make sure that you have a light by your  bed that is easy to reach. Do not use any sheets or blankets that are too big for your bed. They should not hang down onto the floor. Have a firm chair that has side arms. You can use this for support while you get dressed. Do not have throw rugs and other things on the floor that can make you trip. What can I do in the kitchen? Clean up any spills right away. Avoid walking on wet floors. Keep items that you use a lot in easy-to-reach places. If you need to reach something above you, use a strong step stool that has a grab bar. Keep electrical cords out of the way. Do not use floor polish or wax that makes floors slippery. If you must use wax, use non-skid floor wax. Do not have throw rugs and other things on the floor that can make you trip. What can I do with my stairs? Do not leave any items on the stairs. Make sure that there are handrails on both sides of the stairs and use them. Fix handrails that are broken or loose. Make sure that handrails are as long as the stairways. Check any carpeting to make sure that it is firmly attached to the stairs. Fix any carpet that is loose or worn. Avoid having throw rugs at the top or bottom of the stairs. If you do have throw rugs, attach them to  the floor with carpet tape. Make sure that you have a light switch at the top of the stairs and the bottom of the stairs. If you do not have them, ask someone to add them for you. What else can I do to help prevent falls? Wear shoes that: Do not have high heels. Have rubber bottoms. Are comfortable and fit you well. Are closed at the toe. Do not wear sandals. If you use a stepladder: Make sure that it is fully opened. Do not climb a closed stepladder. Make sure that both sides of the stepladder are locked into place. Ask someone to hold it for you, if possible. Clearly mark and make sure that you can see: Any grab bars or handrails. First and last steps. Where the edge of each step is. Use tools that  help you move around (mobility aids) if they are needed. These include: Canes. Walkers. Scooters. Crutches. Turn on the lights when you go into a dark area. Replace any light bulbs as soon as they burn out. Set up your furniture so you have a clear path. Avoid moving your furniture around. If any of your floors are uneven, fix them. If there are any pets around you, be aware of where they are. Review your medicines with your doctor. Some medicines can make you feel dizzy. This can increase your chance of falling. Ask your doctor what other things that you can do to help prevent falls. This information is not intended to replace advice given to you by your health care provider. Make sure you discuss any questions you have with your health care provider. Document Released: 09/29/2009 Document Revised: 05/10/2016 Document Reviewed: 01/07/2015 Elsevier Interactive Patient Education  2017 [employer].

## 2022-01-12 DIAGNOSIS — K5904 Chronic idiopathic constipation: Secondary | ICD-10-CM | POA: Diagnosis not present

## 2022-01-12 DIAGNOSIS — M545 Low back pain, unspecified: Secondary | ICD-10-CM | POA: Diagnosis not present

## 2022-01-12 DIAGNOSIS — G20 Parkinson's disease: Secondary | ICD-10-CM | POA: Diagnosis not present

## 2022-01-12 DIAGNOSIS — G249 Dystonia, unspecified: Secondary | ICD-10-CM | POA: Diagnosis not present

## 2022-01-12 DIAGNOSIS — G479 Sleep disorder, unspecified: Secondary | ICD-10-CM | POA: Diagnosis not present

## 2022-01-12 DIAGNOSIS — R252 Cramp and spasm: Secondary | ICD-10-CM | POA: Diagnosis not present

## 2022-01-26 ENCOUNTER — Ambulatory Visit (INDEPENDENT_AMBULATORY_CARE_PROVIDER_SITE_OTHER): Payer: Medicare HMO

## 2022-01-26 ENCOUNTER — Other Ambulatory Visit: Payer: Self-pay

## 2022-01-26 DIAGNOSIS — I350 Nonrheumatic aortic (valve) stenosis: Secondary | ICD-10-CM

## 2022-01-26 LAB — ECHOCARDIOGRAM COMPLETE
AR max vel: 1.34 cm2
AV Area VTI: 1.21 cm2
AV Area mean vel: 1.21 cm2
AV Mean grad: 20 mmHg
AV Peak grad: 33.6 mmHg
Ao pk vel: 2.9 m/s
Area-P 1/2: 2.84 cm2
Calc EF: 64.3 %
S' Lateral: 3.8 cm
Single Plane A2C EF: 66.9 %
Single Plane A4C EF: 62.8 %

## 2022-01-29 ENCOUNTER — Telehealth: Payer: Self-pay

## 2022-01-29 NOTE — Telephone Encounter (Signed)
Able to reach pt regarding his recent ECHO, Dr. Rockey Situ had a chance to review his results and advised   "Echo  Normal EF  Moderate aortic valve stenosis "  [name] very thankful for the phone call of his results, all questions and concerns were address with nothing further at this time. Will see at next schedule f/u appt.

## 2022-02-16 ENCOUNTER — Ambulatory Visit: Payer: Medicare HMO | Admitting: Occupational Therapy

## 2022-02-26 ENCOUNTER — Encounter: Payer: Medicare HMO | Admitting: Occupational Therapy

## 2022-02-27 ENCOUNTER — Encounter: Payer: Medicare HMO | Admitting: Occupational Therapy

## 2022-02-28 ENCOUNTER — Encounter: Payer: Medicare HMO | Admitting: Occupational Therapy

## 2022-03-02 ENCOUNTER — Encounter: Payer: Medicare HMO | Admitting: Occupational Therapy

## 2022-03-04 ENCOUNTER — Other Ambulatory Visit: Payer: Self-pay | Admitting: Family Medicine

## 2022-03-04 DIAGNOSIS — R1084 Generalized abdominal pain: Secondary | ICD-10-CM

## 2022-03-05 ENCOUNTER — Encounter: Payer: Medicare HMO | Admitting: Occupational Therapy

## 2022-03-06 ENCOUNTER — Encounter: Payer: Medicare HMO | Admitting: Occupational Therapy

## 2022-03-07 ENCOUNTER — Encounter: Payer: Medicare HMO | Admitting: Occupational Therapy

## 2022-03-09 ENCOUNTER — Encounter: Payer: Medicare HMO | Admitting: Occupational Therapy

## 2022-03-13 ENCOUNTER — Encounter: Payer: Medicare HMO | Admitting: Occupational Therapy

## 2022-03-14 ENCOUNTER — Encounter: Payer: Medicare HMO | Admitting: Occupational Therapy

## 2022-03-16 ENCOUNTER — Encounter: Payer: Medicare HMO | Admitting: Occupational Therapy

## 2022-03-19 ENCOUNTER — Encounter: Payer: Medicare HMO | Admitting: Occupational Therapy

## 2022-03-20 ENCOUNTER — Encounter: Payer: Medicare HMO | Admitting: Occupational Therapy

## 2022-03-21 ENCOUNTER — Encounter: Payer: Medicare HMO | Admitting: Occupational Therapy

## 2022-03-23 ENCOUNTER — Encounter: Payer: Medicare HMO | Admitting: Occupational Therapy

## 2022-03-26 ENCOUNTER — Encounter: Payer: Medicare HMO | Admitting: Occupational Therapy

## 2022-03-27 ENCOUNTER — Encounter: Payer: Medicare HMO | Admitting: Occupational Therapy

## 2022-03-28 ENCOUNTER — Encounter: Payer: Medicare HMO | Admitting: Occupational Therapy

## 2022-05-23 DIAGNOSIS — C61 Malignant neoplasm of prostate: Secondary | ICD-10-CM | POA: Diagnosis not present

## 2022-05-24 DIAGNOSIS — C61 Malignant neoplasm of prostate: Secondary | ICD-10-CM | POA: Diagnosis not present

## 2022-05-24 DIAGNOSIS — R3915 Urgency of urination: Secondary | ICD-10-CM | POA: Diagnosis not present

## 2022-06-04 DIAGNOSIS — H34232 Retinal artery branch occlusion, left eye: Secondary | ICD-10-CM | POA: Diagnosis not present

## 2022-06-04 DIAGNOSIS — Z961 Presence of intraocular lens: Secondary | ICD-10-CM | POA: Diagnosis not present

## 2022-06-04 DIAGNOSIS — H04123 Dry eye syndrome of bilateral lacrimal glands: Secondary | ICD-10-CM | POA: Diagnosis not present

## 2022-07-12 DIAGNOSIS — Z8546 Personal history of malignant neoplasm of prostate: Secondary | ICD-10-CM | POA: Diagnosis not present

## 2022-07-12 DIAGNOSIS — R252 Cramp and spasm: Secondary | ICD-10-CM | POA: Diagnosis not present

## 2022-07-12 DIAGNOSIS — E538 Deficiency of other specified B group vitamins: Secondary | ICD-10-CM | POA: Diagnosis not present

## 2022-07-12 DIAGNOSIS — E559 Vitamin D deficiency, unspecified: Secondary | ICD-10-CM | POA: Diagnosis not present

## 2022-07-12 DIAGNOSIS — G479 Sleep disorder, unspecified: Secondary | ICD-10-CM | POA: Diagnosis not present

## 2022-07-12 DIAGNOSIS — K5904 Chronic idiopathic constipation: Secondary | ICD-10-CM | POA: Diagnosis not present

## 2022-07-12 DIAGNOSIS — R4189 Other symptoms and signs involving cognitive functions and awareness: Secondary | ICD-10-CM | POA: Diagnosis not present

## 2022-07-12 DIAGNOSIS — K117 Disturbances of salivary secretion: Secondary | ICD-10-CM | POA: Diagnosis not present

## 2022-07-12 DIAGNOSIS — G249 Dystonia, unspecified: Secondary | ICD-10-CM | POA: Diagnosis not present

## 2022-07-12 DIAGNOSIS — G20 Parkinson's disease: Secondary | ICD-10-CM | POA: Diagnosis not present

## 2022-07-19 ENCOUNTER — Other Ambulatory Visit: Payer: Self-pay | Admitting: Neurology

## 2022-07-19 DIAGNOSIS — Z9181 History of falling: Secondary | ICD-10-CM

## 2022-07-19 DIAGNOSIS — G20 Parkinson's disease: Secondary | ICD-10-CM

## 2022-07-19 DIAGNOSIS — G20A1 Parkinson's disease without dyskinesia, without mention of fluctuations: Secondary | ICD-10-CM

## 2022-08-01 ENCOUNTER — Ambulatory Visit (INDEPENDENT_AMBULATORY_CARE_PROVIDER_SITE_OTHER): Payer: Medicare HMO | Admitting: Family Medicine

## 2022-08-01 ENCOUNTER — Encounter: Payer: Self-pay | Admitting: Family Medicine

## 2022-08-01 VITALS — BP 124/75 | HR 65 | Resp 16 | Wt 148.0 lb

## 2022-08-01 DIAGNOSIS — I10 Essential (primary) hypertension: Secondary | ICD-10-CM | POA: Diagnosis not present

## 2022-08-01 DIAGNOSIS — K5900 Constipation, unspecified: Secondary | ICD-10-CM | POA: Diagnosis not present

## 2022-08-01 DIAGNOSIS — G20 Parkinson's disease: Secondary | ICD-10-CM

## 2022-08-01 DIAGNOSIS — E782 Mixed hyperlipidemia: Secondary | ICD-10-CM

## 2022-08-01 DIAGNOSIS — R109 Unspecified abdominal pain: Secondary | ICD-10-CM | POA: Diagnosis not present

## 2022-08-01 DIAGNOSIS — R634 Abnormal weight loss: Secondary | ICD-10-CM

## 2022-08-01 NOTE — Progress Notes (Signed)
Established patient visit  I,April Miller,acting as a scribe for Wilhemena Durie, MD.,have documented all relevant documentation on the behalf of Wilhemena Durie, MD,as directed by  Wilhemena Durie, MD while in the presence of Wilhemena Durie, MD.   Patient: [name] [name].   DOB: 09-03-1946   76 y.o. Male  MRN: [id number] Visit Date: 08/01/2022  Today's healthcare provider: Wilhemena Durie, MD   Chief Complaint  Patient presents with   GI Problem   Subjective    HPI  Patient is here for referral to Gastroenterology for GI problems. Patient is having constipation and stomach tightness with exercise. No dark stools.  Had an unintentional 11 pound weight loss since January of this year.  Chronic constipation.  He is frustrated by the GI issues. States that he has nocturia x3 which is better with Ditropan XL. Parkinson's continues to slowly progress. Medications: Outpatient Medications Prior to Visit  Medication Sig   amLODipine (NORVASC) 5 MG tablet Take 1 tablet (5 mg total) by mouth daily.   atorvastatin (LIPITOR) 80 MG tablet TAKE 1 TABLET BY MOUTH ONCE DAILY. PLEASE SCHEDULE OFFICE VISIT FOR FURTHER REFILLS. THANK YOU!   B Complex Vitamins (VITAMIN B COMPLEX PO) Take by mouth daily.   benazepril (LOTENSIN) 40 MG tablet Take 1 tablet (40 mg total) by mouth daily.   carbidopa-levodopa (SINEMET CR) 50-200 MG tablet Take 1 tablet by mouth 2 (two) times daily.   carbidopa-levodopa (SINEMET IR) 25-100 MG tablet Take 2 tablets by mouth 3 (three) times daily.   carbidopa-levodopa-entacapone (STALEVO) 50-200-200 MG tablet Take 1 tablet by mouth in the morning and at bedtime.   Cyanocobalamin (VITAMIN B-12) 2500 MCG SUBL Place under the tongue daily.   Omega-3 Fatty Acids (OMEGA 3 PO) Take 1,400 mg by mouth daily.   omeprazole (PRILOSEC) 20 MG capsule TAKE 1 CAPSULE BY MOUTH EVERY DAY   oxybutynin (DITROPAN-XL) 10 MG 24 hr tablet Take by mouth.   Vitamin D,  Ergocalciferol, (DRISDOL) 1.25 MG (50000 UT) CAPS capsule Take 1 capsule (50,000 Units total) by mouth every 7 (seven) days.   [DISCONTINUED] aspirin 81 MG EC tablet Take 81 mg by mouth daily.  (Patient not taking: Reported on 01/11/2022)   No facility-administered medications prior to visit.    Review of Systems  Constitutional:  Negative for appetite change, chills and fever.  Respiratory:  Negative for chest tightness, shortness of breath and wheezing.   Cardiovascular:  Negative for chest pain and palpitations.  Gastrointestinal:  Negative for abdominal pain, nausea and vomiting.        Objective    BP 124/75 (BP Location: Right Arm, Patient Position: Sitting, Cuff Size: Normal)   Pulse 65   Resp 16   Wt 148 lb (67.1 kg)   SpO2 100%   BMI 22.50 kg/m  BP Readings from Last 3 Encounters:  08/01/22 124/75  01/02/22 140/82  10/25/21 117/85   Wt Readings from Last 3 Encounters:  08/01/22 148 lb (67.1 kg)  01/02/22 159 lb 4 oz (72.2 kg)  10/25/21 160 lb (72.6 kg)      Physical Exam Vitals reviewed.  Constitutional:      Appearance: He is well-developed.  HENT:     Head: Normocephalic and atraumatic.     Right Ear: External ear normal.     Left Ear: External ear normal.     Nose: Nose normal.  Eyes:     General: No scleral icterus.  Conjunctiva/sclera: Conjunctivae normal.     Pupils: Pupils are equal, round, and reactive to light.  Neck:     Thyroid: No thyromegaly.  Cardiovascular:     Rate and Rhythm: Normal rate and regular rhythm.     Heart sounds: Normal heart sounds.  Pulmonary:     Effort: Pulmonary effort is normal.     Breath sounds: Normal breath sounds.  Abdominal:     General: There is no distension.     Palpations: Abdomen is soft. There is no mass.     Tenderness: There is no abdominal tenderness.  Lymphadenopathy:     Cervical: No cervical adenopathy.  Skin:    General: Skin is warm and dry.     Comments: SKs of face.  Neurological:      Mental Status: He is alert and oriented to person, place, and time. Mental status is at baseline.     Comments: Parkinson stigmata.  Psychiatric:        Mood and Affect: Mood normal.        Behavior: Behavior normal.        Thought Content: Thought content normal.        Judgment: Judgment normal.       No results found for any visits on 08/01/22.  Assessment & Plan     1. Paralysis agitans [facility]) Neurology. - CBC w/Diff/Platelet - TSH - Comprehensive Metabolic Panel (CMET) - Lipid panel  2. Essential hypertension Good control - CBC w/Diff/Platelet - TSH - Comprehensive Metabolic Panel (CMET) - Lipid panel  3. Mixed hyperlipidemia  - CBC w/Diff/Platelet - TSH - Comprehensive Metabolic Panel (CMET) - Lipid panel  4. Abnormal weight loss  Refer to GI.  We will obtain baseline lab work. - Ambulatory referral to Gastroenterology - CBC w/Diff/Platelet - TSH - Comprehensive Metabolic Panel (CMET) - Lipid panel  5. Constipation, unspecified constipation type Try GlycoLax twice a day.   - Ambulatory referral to Gastroenterology - CBC w/Diff/Platelet - TSH - Comprehensive Metabolic Panel (CMET) - Lipid panel  6. Stomach discomfort Normal exam.  Patient already on Prilosec. - Ambulatory referral to Gastroenterology - CBC w/Diff/Platelet - TSH - Comprehensive Metabolic Panel (CMET) - Lipid panel   No follow-ups on file.      I, Wilhemena Durie, MD, have reviewed all documentation for this visit. The documentation on 08/05/22 for the exam, diagnosis, procedures, and orders are all accurate and complete.    Alea Ryer Cranford Mon, MD  [facility] [phone] (phone) [phone] (fax)  [place]

## 2022-08-01 NOTE — Patient Instructions (Signed)
TRY OVER-THE-COUNTER GLYCOMAX TWICE A DAY FOR CONSTIPATION.

## 2022-08-02 LAB — CBC WITH DIFFERENTIAL/PLATELET
Basophils Absolute: 0 10*3/uL (ref 0.0–0.2)
Basos: 0 %
EOS (ABSOLUTE): 0 10*3/uL (ref 0.0–0.4)
Eos: 1 %
Hematocrit: 40.7 % (ref 37.5–51.0)
Hemoglobin: 13.5 g/dL (ref 13.0–17.7)
Immature Grans (Abs): 0 10*3/uL (ref 0.0–0.1)
Immature Granulocytes: 0 %
Lymphocytes Absolute: 0.7 10*3/uL (ref 0.7–3.1)
Lymphs: 13 %
MCH: 31.3 pg (ref 26.6–33.0)
MCHC: 33.2 g/dL (ref 31.5–35.7)
MCV: 94 fL (ref 79–97)
Monocytes Absolute: 0.5 10*3/uL (ref 0.1–0.9)
Monocytes: 10 %
Neutrophils Absolute: 4.1 10*3/uL (ref 1.4–7.0)
Neutrophils: 76 %
Platelets: 180 10*3/uL (ref 150–450)
RBC: 4.31 x10E6/uL (ref 4.14–5.80)
RDW: 12.3 % (ref 11.6–15.4)
WBC: 5.4 10*3/uL (ref 3.4–10.8)

## 2022-08-02 LAB — COMPREHENSIVE METABOLIC PANEL
ALT: 15 IU/L (ref 0–44)
AST: 15 IU/L (ref 0–40)
Albumin/Globulin Ratio: 1.7 (ref 1.2–2.2)
Albumin: 4 g/dL (ref 3.8–4.8)
Alkaline Phosphatase: 54 IU/L (ref 44–121)
BUN/Creatinine Ratio: 25 — ABNORMAL HIGH (ref 10–24)
BUN: 15 mg/dL (ref 8–27)
Bilirubin Total: 0.8 mg/dL (ref 0.0–1.2)
CO2: 21 mmol/L (ref 20–29)
Calcium: 8.9 mg/dL (ref 8.6–10.2)
Chloride: 100 mmol/L (ref 96–106)
Creatinine, Ser: 0.59 mg/dL — ABNORMAL LOW (ref 0.76–1.27)
Globulin, Total: 2.4 g/dL (ref 1.5–4.5)
Glucose: 103 mg/dL — ABNORMAL HIGH (ref 70–99)
Potassium: 4.3 mmol/L (ref 3.5–5.2)
Sodium: 137 mmol/L (ref 134–144)
Total Protein: 6.4 g/dL (ref 6.0–8.5)
eGFR: 101 mL/min/{1.73_m2} (ref >59)

## 2022-08-02 LAB — LIPID PANEL
Chol/HDL Ratio: 2.9 ratio (ref 0.0–5.0)
Cholesterol, Total: 158 mg/dL (ref 100–199)
HDL: 54 mg/dL (ref >39)
LDL Chol Calc (NIH): 89 mg/dL (ref 0–99)
Triglycerides: 78 mg/dL (ref 0–149)
VLDL Cholesterol Cal: 15 mg/dL (ref 5–40)

## 2022-08-02 LAB — TSH: TSH: 0.493 u[IU]/mL (ref 0.450–4.500)

## 2022-08-03 ENCOUNTER — Ambulatory Visit
Admission: RE | Admit: 2022-08-03 | Discharge: 2022-08-03 | Disposition: A | Discharge status: Home / Self Care | Payer: Medicare HMO | Source: Ambulatory Visit | Attending: Neurology | Admitting: Neurology

## 2022-08-03 DIAGNOSIS — Z043 Encounter for examination and observation following other accident: Secondary | ICD-10-CM | POA: Diagnosis not present

## 2022-08-03 DIAGNOSIS — Z9181 History of falling: Secondary | ICD-10-CM | POA: Diagnosis not present

## 2022-08-03 DIAGNOSIS — G20 Parkinson's disease: Secondary | ICD-10-CM | POA: Insufficient documentation

## 2022-08-07 DIAGNOSIS — Z20822 Contact with and (suspected) exposure to covid-19: Secondary | ICD-10-CM | POA: Diagnosis not present

## 2022-08-07 DIAGNOSIS — Z03818 Encounter for observation for suspected exposure to other biological agents ruled out: Secondary | ICD-10-CM | POA: Diagnosis not present

## 2022-08-13 DIAGNOSIS — E538 Deficiency of other specified B group vitamins: Secondary | ICD-10-CM | POA: Diagnosis not present

## 2022-08-13 DIAGNOSIS — E559 Vitamin D deficiency, unspecified: Secondary | ICD-10-CM | POA: Diagnosis not present

## 2022-08-13 DIAGNOSIS — R4189 Other symptoms and signs involving cognitive functions and awareness: Secondary | ICD-10-CM | POA: Diagnosis not present

## 2022-08-13 DIAGNOSIS — G20 Parkinson's disease: Secondary | ICD-10-CM | POA: Diagnosis not present

## 2022-08-13 DIAGNOSIS — G479 Sleep disorder, unspecified: Secondary | ICD-10-CM | POA: Diagnosis not present

## 2022-08-15 ENCOUNTER — Telehealth: Payer: Self-pay

## 2022-08-15 NOTE — Telephone Encounter (Signed)
Copied from [place] [phone]. Topic: General - Other >> Aug 15, 2022  3:09 PM [name] [name] wrote: Reason for CRM: The patient was seen at [facility] for COVID 19 testing yesterday 08/14/22  The patient has been told that the cost of testing will be roughly $200 if their insurance does not cover it   The patient is requesting to have a letter submitted to [place] stating that the patient needed to be tested for COVID 19 in order for the testing to be covered by the patient's insurance   Please contact the patient's wife further when possible

## 2022-08-16 ENCOUNTER — Telehealth: Payer: Self-pay | Admitting: *Deleted

## 2022-08-16 NOTE — Telephone Encounter (Signed)
If it is for travel insurance will not cover it.

## 2022-08-16 NOTE — Telephone Encounter (Signed)
It is for travel.  [place] and was required to test first.  CB@  [phone]

## 2022-08-16 NOTE — Telephone Encounter (Signed)
Usually not covered I would say.  I am happy to write a letter for testing but the home test is adequate at this time.  He can pick them up at any drugstore.

## 2022-08-16 NOTE — Telephone Encounter (Signed)
LMOVM for pt to cb. We need to know why he got the test done!! Was he sick or was it for travel? Okay for pec to ask pt. Thanks.

## 2022-08-16 NOTE — Telephone Encounter (Signed)
Please advise 

## 2022-08-16 NOTE — Telephone Encounter (Signed)
Copied from [place] [phone]. Topic: General - Other >> Aug 16, 2022  2:33 PM Chapman Fitch wrote: Reason for CRM: Pt took a covid test at [facility] and he was charged $200 and insurance wouldn't cover it / Warden/ranger is needing a letter from DR. Rosanna Randy stating that the pt needed the covid testing/ they will handle it from there to help get the test covered / please advise asap so pt can pick up letter

## 2022-08-16 NOTE — Telephone Encounter (Signed)
LMOVM for pt to return call 

## 2022-08-17 ENCOUNTER — Encounter: Payer: Self-pay | Admitting: *Deleted

## 2022-08-17 NOTE — Telephone Encounter (Signed)
Letter done, patient advised.

## 2022-09-02 ENCOUNTER — Other Ambulatory Visit: Payer: Self-pay | Admitting: Family Medicine

## 2022-09-02 DIAGNOSIS — R1084 Generalized abdominal pain: Secondary | ICD-10-CM

## 2022-11-09 DIAGNOSIS — E538 Deficiency of other specified B group vitamins: Secondary | ICD-10-CM | POA: Diagnosis not present

## 2022-11-09 DIAGNOSIS — M1990 Unspecified osteoarthritis, unspecified site: Secondary | ICD-10-CM | POA: Diagnosis not present

## 2022-11-09 DIAGNOSIS — L309 Dermatitis, unspecified: Secondary | ICD-10-CM | POA: Diagnosis not present

## 2022-11-09 DIAGNOSIS — Z7982 Long term (current) use of aspirin: Secondary | ICD-10-CM | POA: Diagnosis not present

## 2022-11-09 DIAGNOSIS — I739 Peripheral vascular disease, unspecified: Secondary | ICD-10-CM | POA: Diagnosis not present

## 2022-11-09 DIAGNOSIS — G3184 Mild cognitive impairment, so stated: Secondary | ICD-10-CM | POA: Diagnosis not present

## 2022-11-09 DIAGNOSIS — I10 Essential (primary) hypertension: Secondary | ICD-10-CM | POA: Diagnosis not present

## 2022-11-09 DIAGNOSIS — I252 Old myocardial infarction: Secondary | ICD-10-CM | POA: Diagnosis not present

## 2022-11-09 DIAGNOSIS — K219 Gastro-esophageal reflux disease without esophagitis: Secondary | ICD-10-CM | POA: Diagnosis not present

## 2022-11-09 DIAGNOSIS — I25119 Atherosclerotic heart disease of native coronary artery with unspecified angina pectoris: Secondary | ICD-10-CM | POA: Diagnosis not present

## 2022-11-09 DIAGNOSIS — N529 Male erectile dysfunction, unspecified: Secondary | ICD-10-CM | POA: Diagnosis not present

## 2022-11-09 DIAGNOSIS — E785 Hyperlipidemia, unspecified: Secondary | ICD-10-CM | POA: Diagnosis not present

## 2022-11-30 DIAGNOSIS — D225 Melanocytic nevi of trunk: Secondary | ICD-10-CM | POA: Diagnosis not present

## 2022-11-30 DIAGNOSIS — L570 Actinic keratosis: Secondary | ICD-10-CM | POA: Diagnosis not present

## 2022-11-30 DIAGNOSIS — D2262 Melanocytic nevi of left upper limb, including shoulder: Secondary | ICD-10-CM | POA: Diagnosis not present

## 2022-11-30 DIAGNOSIS — D2271 Melanocytic nevi of right lower limb, including hip: Secondary | ICD-10-CM | POA: Diagnosis not present

## 2022-11-30 DIAGNOSIS — D2261 Melanocytic nevi of right upper limb, including shoulder: Secondary | ICD-10-CM | POA: Diagnosis not present

## 2022-11-30 DIAGNOSIS — D485 Neoplasm of uncertain behavior of skin: Secondary | ICD-10-CM | POA: Diagnosis not present

## 2022-11-30 DIAGNOSIS — L821 Other seborrheic keratosis: Secondary | ICD-10-CM | POA: Diagnosis not present

## 2022-11-30 DIAGNOSIS — D0439 Carcinoma in situ of skin of other parts of face: Secondary | ICD-10-CM | POA: Diagnosis not present

## 2022-11-30 DIAGNOSIS — D2272 Melanocytic nevi of left lower limb, including hip: Secondary | ICD-10-CM | POA: Diagnosis not present

## 2022-11-30 DIAGNOSIS — C44229 Squamous cell carcinoma of skin of left ear and external auricular canal: Secondary | ICD-10-CM | POA: Diagnosis not present

## 2022-11-30 DIAGNOSIS — L814 Other melanin hyperpigmentation: Secondary | ICD-10-CM | POA: Diagnosis not present

## 2022-12-24 DIAGNOSIS — R4189 Other symptoms and signs involving cognitive functions and awareness: Secondary | ICD-10-CM | POA: Diagnosis not present

## 2022-12-24 DIAGNOSIS — E538 Deficiency of other specified B group vitamins: Secondary | ICD-10-CM | POA: Diagnosis not present

## 2022-12-24 DIAGNOSIS — G20B2 Parkinson's disease with dyskinesia, with fluctuations: Secondary | ICD-10-CM | POA: Diagnosis not present

## 2022-12-24 DIAGNOSIS — G479 Sleep disorder, unspecified: Secondary | ICD-10-CM | POA: Diagnosis not present

## 2022-12-24 DIAGNOSIS — K5904 Chronic idiopathic constipation: Secondary | ICD-10-CM | POA: Diagnosis not present

## 2023-01-03 DIAGNOSIS — R3915 Urgency of urination: Secondary | ICD-10-CM | POA: Diagnosis not present

## 2023-01-03 DIAGNOSIS — C61 Malignant neoplasm of prostate: Secondary | ICD-10-CM | POA: Diagnosis not present

## 2023-01-10 ENCOUNTER — Telehealth: Payer: Self-pay

## 2023-01-10 NOTE — Telephone Encounter (Signed)
Copied from [place] [phone]. Topic: Appointment Scheduling - Scheduling Inquiry for Clinic >> Jan 10, 2023 12:37 PM [name] [name] wrote: Reason for CRM: Pt wants to reschedule his AWV for the morning instead of afternoon, please advise

## 2023-01-17 DIAGNOSIS — C44229 Squamous cell carcinoma of skin of left ear and external auricular canal: Secondary | ICD-10-CM | POA: Diagnosis not present

## 2023-01-27 NOTE — Progress Notes (Unsigned)
Cardiology Office Note  Date:  01/28/2023   ID:  [name] Liming., DOB 1946/12/14, MRN [id number]  PCP:  Eulas Post, MD   Chief Complaint  Patient presents with   12 month follow up     "Doing well." Medications reviewed by the patient verbally.     HPI:  [name] [name] is a pleasant 77 year old gentleman with a history of  bilateral mild carotid disease,  prior MI in 1994 with disease in the distal RCA with medical management  stress test in January 2012 with fixed inferior wall defect,   history of Parkinson's Presents for routine followup of his coronary artery disease and PAD, moderate aortic valve stenosis  Last seen by myself in clinic January 2023 In follow-up today no new cardiac issues Exercising on a regular basis, denies shortness of breath or chest pain on exertion Weight stable 159 last year to 158 today Does some light weights  Does not feel there has been much progression in his Parkinson's [place] [employer] [place]  Labs reviewed Total chol 158, LDL 89  Echo 07/2019  normal cardiac function Aortic valve is heavily calcified with mild stenosis Mean gradient 10  History of prostate cancer completed XRT, 5 weeks Followed by urology  Sees Dr. Shah,neurology parkinsons   EKG personally reviewed by myself on todays visit  shows normal sinus rhythm with rate 63 bpm, new rbbb Old inferior MI    PMH:   has a past medical history of Acute myocardial infarction of other inferior wall, episode of care unspecified (04/1993), Cancer ([place]), Coronary atherosclerosis of native coronary artery, History of shingles, Occlusion and stenosis of carotid artery without mention of cerebral infarction, Other and unspecified hyperlipidemia, Parkinson's disease, Peripheral vascular disease, unspecified ([place]), Precancerous skin lesion, Prostate cancer ([place]) (10/2016), Shingles (07/2014), Squamous cell cancer of skin of nose, Unspecified cerebral artery occlusion  with cerebral infarction (2006), and Unspecified essential hypertension.  PSH:    Past Surgical History:  Procedure Laterality Date   BIOPSY PROSTATE  11/12/2017   CATARACT EXTRACTION W/PHACO Left 10/11/2021   Procedure: CATARACT EXTRACTION PHACO AND INTRAOCULAR LENS PLACEMENT ([place]) LEFT;  Surgeon: Leandrew Koyanagi, MD;  Location: [facility];  Service: Ophthalmology;  Laterality: Left;  5.15 01:01.6   CATARACT EXTRACTION W/PHACO Right 10/25/2021   Procedure: CATARACT EXTRACTION PHACO AND INTRAOCULAR LENS PLACEMENT ([place]) RIGHT;  Surgeon: Leandrew Koyanagi, MD;  Location: [facility];  Service: Ophthalmology;  Laterality: Right;  5.56 00:50.6   COLONOSCOPY WITH PROPOFOL N/A 02/11/2017   Procedure: COLONOSCOPY WITH PROPOFOL;  Surgeon: Manya Silvas, MD;  Location[facility]COPY;  Service: Endoscopy;  Laterality: N/A;   HERNIA REPAIR  02/17/15   right inguinal hernia , laparoscopic placement of [place] 3-D max mesh   HERNIA REPAIR  3/316   ventral hernia, umbilical, open repair   MOHS SURGERY     SKIN BIOPSY     skin cancer   TRANSRECTAL ULTRASOUND  11/12/2017    Current Outpatient Medications  Medication Sig Dispense Refill   Amantadine HCl 100 MG tablet Take 100 mg by mouth 2 (two) times daily.     amLODipine (NORVASC) 5 MG tablet Take 1 tablet (5 mg total) by mouth daily. 90 tablet 3   atorvastatin (LIPITOR) 80 MG tablet TAKE 1 TABLET BY MOUTH ONCE DAILY. PLEASE SCHEDULE OFFICE VISIT FOR FURTHER REFILLS. THANK YOU! 90 tablet 3   B Complex Vitamins (VITAMIN B COMPLEX PO) Take by mouth daily.  benazepril (LOTENSIN) 40 MG tablet Take 1 tablet (40 mg total) by mouth daily. 90 tablet 3   carbidopa-levodopa (SINEMET CR) 50-200 MG tablet Take 1 tablet by mouth 2 (two) times daily.     carbidopa-levodopa (SINEMET IR) 25-100 MG tablet Take 2 tablets by mouth 3 (three) times daily.     carbidopa-levodopa-entacapone (STALEVO) 50-200-200 MG tablet Take 1 tablet by mouth in  the morning and at bedtime. 180 tablet 3   Cyanocobalamin (VITAMIN B-12) 2500 MCG SUBL Place under the tongue daily.     Omega-3 Fatty Acids (OMEGA 3 PO) Take 1,400 mg by mouth daily.     omeprazole (PRILOSEC) 20 MG capsule TAKE 1 CAPSULE BY MOUTH EVERY DAY 90 capsule 1   Vitamin D, Ergocalciferol, (DRISDOL) 1.25 MG (50000 UT) CAPS capsule Take 1 capsule (50,000 Units total) by mouth every 7 (seven) days. 4 capsule 12   No current facility-administered medications for this visit.     Allergies:   Donepezil and Penicillins   Social History:  The patient  reports that he has never smoked. He has never used smokeless tobacco. He reports current alcohol use of about 21.0 standard drinks of alcohol per week. He reports that he does not use drugs.   Family History:   family history includes Heart disease in his father and another family member; Hypertension in his brother, mother, and sister.    Review of Systems: Review of Systems  Constitutional: Negative.   Respiratory: Negative.    Cardiovascular: Negative.   Gastrointestinal: Negative.   Musculoskeletal: Negative.   Neurological:  Positive for tremors.  Psychiatric/Behavioral:  Positive for memory loss.   All other systems reviewed and are negative.   PHYSICAL EXAM: VS:  BP 120/62 (BP Location: Left Arm, Patient Position: Sitting, Cuff Size: Normal)   Pulse (!) 56   Ht 5' 8"$  (1.727 m)   Wt 158 lb (71.7 kg)   SpO2 97%   BMI 24.02 kg/m  , BMI Body mass index is 24.02 kg/m. Constitutional:  oriented to person, place, and time. No distress.  HENT:  Head: Grossly normal Eyes:  no discharge. No scleral icterus.  Neck: No JVD, no carotid bruits  Cardiovascular: Regular rate and rhythm, no murmurs appreciated Pulmonary/Chest: Clear to auscultation bilaterally, no wheezes or rails Abdominal: Soft.  no distension.  no tenderness.  Musculoskeletal: Normal range of motion Neurological:  normal muscle tone. Coordination normal. No  atrophy Skin: Skin warm and dry Psychiatric: normal affect, pleasant   Recent Labs: 08/01/2022: ALT 15; BUN 15; Creatinine, Ser 0.59; Hemoglobin 13.5; Platelets 180; Potassium 4.3; Sodium 137; TSH 0.493    Lipid Panel Lab Results  Component Value Date   CHOL 158 08/01/2022   HDL 54 08/01/2022   LDLCALC 89 08/01/2022   TRIG 78 08/01/2022    Wt Readings from Last 3 Encounters:  01/28/23 158 lb (71.7 kg)  08/01/22 148 lb (67.1 kg)  01/02/22 159 lb 4 oz (72.2 kg)     ASSESSMENT AND PLAN:  Atherosclerosis of native coronary artery of native heart with stable angina pectoris (HCC) Currently with no symptoms of angina. No further workup at this time. Continue current medication regimen.  Fatigue, chronic Poor sleep hygiene, nocturia, Parkinson's Recommend he continue his exercise program  Essential hypertension Blood pressure is well controlled on today's visit. No changes made to the medications.  Mixed hyperlipidemia On lipitor, cholesterol close to goal  Bilateral carotid artery stenosis  carotid ultrasound 10/2018, less than 39% disease bilaterally,  No bruits on exam Cholesterol at goal  PARKINSON'S DISEASE memory loss, tremor, balance , stable Followed by neurology Recommend he continue his regular exercise program  Aortic valve stenosis Moderate aortic valve stenosis on echo February 2023 Asymptomatic, continue monitoring with periodic echo   Total encounter time more than 30 minutes  Greater than 50% was spent in counseling and coordination of care with the patient    Orders Placed This Encounter  Procedures   EKG 12-Lead     Signed, Esmond Plants, M.D., Ph.D. 01/28/2023  [place], [place]

## 2023-01-28 ENCOUNTER — Encounter: Payer: Self-pay | Admitting: Cardiovascular Disease

## 2023-01-28 ENCOUNTER — Ambulatory Visit: Payer: Medicare HMO | Attending: Cardiovascular Disease | Admitting: Cardiovascular Disease

## 2023-01-28 VITALS — BP 120/62 | HR 56 | Ht 68.0 in | Wt 158.0 lb

## 2023-01-28 DIAGNOSIS — I6523 Occlusion and stenosis of bilateral carotid arteries: Secondary | ICD-10-CM

## 2023-01-28 DIAGNOSIS — I25118 Atherosclerotic heart disease of native coronary artery with other forms of angina pectoris: Secondary | ICD-10-CM

## 2023-01-28 DIAGNOSIS — I10 Essential (primary) hypertension: Secondary | ICD-10-CM | POA: Diagnosis not present

## 2023-01-28 DIAGNOSIS — I350 Nonrheumatic aortic (valve) stenosis: Secondary | ICD-10-CM | POA: Diagnosis not present

## 2023-01-28 DIAGNOSIS — I739 Peripheral vascular disease, unspecified: Secondary | ICD-10-CM | POA: Diagnosis not present

## 2023-01-28 DIAGNOSIS — I2510 Atherosclerotic heart disease of native coronary artery without angina pectoris: Secondary | ICD-10-CM | POA: Diagnosis not present

## 2023-01-28 DIAGNOSIS — I9589 Other hypotension: Secondary | ICD-10-CM | POA: Diagnosis not present

## 2023-01-28 MED ORDER — ATORVASTATIN CALCIUM 80 MG PO TABS: ORAL_TABLET | ORAL | 1 refills | Status: DC

## 2023-01-28 MED ORDER — AMLODIPINE BESYLATE 5 MG PO TABS: 5.0000 mg | ORAL_TABLET | Freq: Every day | ORAL | 1 refills | Status: DC

## 2023-01-28 MED ORDER — BENAZEPRIL HCL 40 MG PO TABS: 40.0000 mg | ORAL_TABLET | Freq: Every day | ORAL | 1 refills | Status: DC

## 2023-01-28 NOTE — Patient Instructions (Addendum)
Medication Instructions:  No changes  If you need a refill on your cardiac medications before your next appointment, please call your pharmacy.   Lab work: No new labs needed  Testing/Procedures: No new testing needed  Follow-Up: At [facility], you and your health needs are our priority.  As part of our continuing mission to provide you with exceptional heart care, we have created designated Provider Care Teams.  These Care Teams include your primary Cardiologist (physician) and Advanced Practice Providers (APPs -  Physician Assistants and Nurse Practitioners) who all work together to provide you with the care you need, when you need it.  You will need a follow up appointment in 12 months  Providers on your designated Care Team:   Christopher Berge, NP Ryan Dunn, PA-C Cadence Furth, PA-C  COVID-19 Vaccine Information can be found at: [email or web address] For questions related to vaccine distribution or appointments, please email [email or web address] or call [phone].   

## 2023-01-31 DIAGNOSIS — R69 Illness, unspecified: Secondary | ICD-10-CM | POA: Diagnosis not present

## 2023-02-05 ENCOUNTER — Ambulatory Visit (INDEPENDENT_AMBULATORY_CARE_PROVIDER_SITE_OTHER): Payer: Medicare HMO

## 2023-02-05 VITALS — Ht 67.5 in | Wt 158.0 lb

## 2023-02-05 DIAGNOSIS — Z0000 Encounter for general adult medical examination without abnormal findings: Secondary | ICD-10-CM

## 2023-02-05 NOTE — Patient Instructions (Signed)
[name] , Thank you for taking time to come for your Medicare Wellness Visit. I appreciate your ongoing commitment to your health goals. Please review the following plan we discussed and let me know if I can assist you in the future.   These are the goals we discussed:  Goals      DIET - EAT MORE FRUITS AND VEGETABLES     Increase water intake     Recommend to increase fluid intake to 6-8 glasses per day.     LIFESTYLE - DECREASE FALLS RISK     Recommend to remove any items from the home that may cause slips or trips.        This is a list of the screening recommended for you and due dates:  Health Maintenance  Topic Date Due   Hepatitis C Screening: USPSTF Recommendation to screen - Ages 62-79 yo.  Never done   Zoster (Shingles) Vaccine (1 of 2) Never done   Colon Cancer Screening  02/11/2022   COVID-19 Vaccine (4 - 2023-24 season) 08/17/2022   Medicare Annual Wellness Visit  02/06/2024   DTaP/Tdap/Td vaccine (2 - Td or Tdap) 04/24/2026   Pneumonia Vaccine  Completed   Flu Shot  Completed   HPV Vaccine  77 Years and Older, Male  Preventive: yes  Conditions/risks identified: none  Next appointment: Follow up in one year for your annual wellness visit. Pt transitioning o Dr.Gilbert's office in [place] 65 Years and Older, Male  Preventive care refers to lifestyle choices and visits with your health care provider that can promote health and wellness. What does preventive care include? A yearly physical exam. This is also called an annual well check. Dental exams once or twice a year. Routine eye exams. Ask your health care provider how often you should have your eyes checked. Personal lifestyle choices, including: Daily care of your teeth and gums. Regular physical activity. Eating a healthy diet. Avoiding tobacco and drug use. Limiting alcohol use. Practicing safe sex. Taking low doses of aspirin every day. Taking vitamin and mineral supplements as  recommended by your health care provider. What happens during an annual well check? The services and screenings done by your health care provider during your annual well check will depend on your age, overall health, lifestyle risk factors, and family history of disease. Counseling  Your health care provider may ask you questions about your: Alcohol use. Tobacco use. Drug use. Emotional well-being. Home and relationship well-being. Sexual activity. Eating habits. History of falls. Memory and ability to understand (cognition). Work and work Statistician. Screening  You may have the following tests or measurements: Height, weight, and BMI. Blood pressure. Lipid and cholesterol levels. These may be checked every 5 years, or more frequently if you are over 49 years old. Skin check. Lung cancer screening. You may have this screening every year starting at age 63 if you have a 30-pack-year history of smoking and currently smoke or have quit within the past 15 years. Fecal occult blood test (FOBT) of the stool. You may have this test every year starting at age 68. Flexible sigmoidoscopy or colonoscopy. You may have a sigmoidoscopy every 5 years or a colonoscopy every 10 years starting at age 59. Prostate cancer screening. Recommendations will vary depending on your family history and other risks. Hepatitis C blood test. Hepatitis B blood test. Sexually transmitted disease (STD) testing. Diabetes screening. This is done by checking your blood sugar (glucose) after you have not eaten  for a while (fasting). You may have this done every 1-3 years. Abdominal aortic aneurysm (AAA) screening. You may need this if you are a current or former smoker. Osteoporosis. You may be screened starting at age 22 if you are at high risk. Talk with your health care provider about your test results, treatment options, and if necessary, the need for more tests. Vaccines  Your health care provider may recommend  certain vaccines, such as: Influenza vaccine. This is recommended every year. Tetanus, diphtheria, and acellular pertussis (Tdap, Td) vaccine. You may need a Td booster every 10 years. Zoster vaccine. You may need this after age 110. Pneumococcal 13-valent conjugate (PCV13) vaccine. One dose is recommended after age 18. Pneumococcal polysaccharide (PPSV23) vaccine. One dose is recommended after age 29. Talk to your health care provider about which screenings and vaccines you need and how often you need them. This information is not intended to replace advice given to you by your health care provider. Make sure you discuss any questions you have with your health care provider. Document Released: 12/30/2015 Document Revised: 08/22/2016 Document Reviewed: 10/04/2015 Elsevier Interactive Patient Education  2017 [employer] Prevention in the Home Falls can cause injuries. They can happen to people of all ages. There are many things you can do to make your home safe and to help prevent falls. What can I do on the outside of my home? Regularly fix the edges of walkways and driveways and fix any cracks. Remove anything that might make you trip as you walk through a door, such as a raised step or threshold. Trim any bushes or trees on the path to your home. Use bright outdoor lighting. Clear any walking paths of anything that might make someone trip, such as rocks or tools. Regularly check to see if handrails are loose or broken. Make sure that both sides of any steps have handrails. Any raised decks and porches should have guardrails on the edges. Have any leaves, snow, or ice cleared regularly. Use sand or salt on walking paths during winter. Clean up any spills in your garage right away. This includes oil or grease spills. What can I do in the bathroom? Use night lights. Install grab bars by the toilet and in the tub and shower. Do not use towel bars as grab bars. Use non-skid mats or  decals in the tub or shower. If you need to sit down in the shower, use a plastic, non-slip stool. Keep the floor dry. Clean up any water that spills on the floor as soon as it happens. Remove soap buildup in the tub or shower regularly. Attach bath mats securely with double-sided non-slip rug tape. Do not have throw rugs and other things on the floor that can make you trip. What can I do in the bedroom? Use night lights. Make sure that you have a light by your bed that is easy to reach. Do not use any sheets or blankets that are too big for your bed. They should not hang down onto the floor. Have a firm chair that has side arms. You can use this for support while you get dressed. Do not have throw rugs and other things on the floor that can make you trip. What can I do in the kitchen? Clean up any spills right away. Avoid walking on wet floors. Keep items that you use a lot in easy-to-reach places. If you need to reach something above you, use a strong step stool that has  a grab bar. Keep electrical cords out of the way. Do not use floor polish or wax that makes floors slippery. If you must use wax, use non-skid floor wax. Do not have throw rugs and other things on the floor that can make you trip. What can I do with my stairs? Do not leave any items on the stairs. Make sure that there are handrails on both sides of the stairs and use them. Fix handrails that are broken or loose. Make sure that handrails are as long as the stairways. Check any carpeting to make sure that it is firmly attached to the stairs. Fix any carpet that is loose or worn. Avoid having throw rugs at the top or bottom of the stairs. If you do have throw rugs, attach them to the floor with carpet tape. Make sure that you have a light switch at the top of the stairs and the bottom of the stairs. If you do not have them, ask someone to add them for you. What else can I do to help prevent falls? Wear shoes that: Do not  have high heels. Have rubber bottoms. Are comfortable and fit you well. Are closed at the toe. Do not wear sandals. If you use a stepladder: Make sure that it is fully opened. Do not climb a closed stepladder. Make sure that both sides of the stepladder are locked into place. Ask someone to hold it for you, if possible. Clearly mark and make sure that you can see: Any grab bars or handrails. First and last steps. Where the edge of each step is. Use tools that help you move around (mobility aids) if they are needed. These include: Canes. Walkers. Scooters. Crutches. Turn on the lights when you go into a dark area. Replace any light bulbs as soon as they burn out. Set up your furniture so you have a clear path. Avoid moving your furniture around. If any of your floors are uneven, fix them. If there are any pets around you, be aware of where they are. Review your medicines with your doctor. Some medicines can make you feel dizzy. This can increase your chance of falling. Ask your doctor what other things that you can do to help prevent falls. This information is not intended to replace advice given to you by your health care provider. Make sure you discuss any questions you have with your health care provider. Document Released: 09/29/2009 Document Revised: 05/10/2016 Document Reviewed: 01/07/2015 Elsevier Interactive Patient Education  2017 [employer].

## 2023-02-05 NOTE — Progress Notes (Signed)
I connected with  [name] on 02/05/23 by a audio enabled telemedicine application and verified that I am speaking with the correct person using two identifiers.  Patient Location: Home  Provider Location: Office/Clinic  I discussed the limitations of evaluation and management by telemedicine. The patient expressed understanding and agreed to proceed.  Subjective:   [name] [name]. is a 77 y.o. male who presents for Medicare Annual/Subsequent preventive examination.  Review of Systems    Cardiac Risk Factors include: advanced age (>65mn, >>67women);dyslipidemia;hypertension;male gender    Objective:    Today's Vitals   02/05/23 0924  Weight: 158 lb (71.7 kg)  Height: 5' 7.5" (1.715 m)   Body mass index is 24.38 kg/m.     02/05/2023    9:35 AM 01/11/2022    3:06 PM 10/25/2021   10:11 AM 10/11/2021   10:46 AM 01/05/2021    9:59 AM 12/30/2019   10:48 AM 12/23/2018   10:37 AM  Advanced Directives  Does Patient Have a Medical Advance Directive? Yes Yes Yes Yes Yes Yes No  Type of ACorporate treasurerof ARobinsonLiving will HNorth CreekLiving will HJackson CenterLiving will HClancyLiving will   Does patient want to make changes to medical advance directive?  Yes (Inpatient - patient defers changing a medical advance directive and declines information at this time) No - Patient declined No - Patient declined     Copy of [place] Chart?  No - copy requested Yes - validated most recent copy scanned in chart (See row information) No - copy requested No - copy requested No - copy requested   Would patient like information on creating a medical advance directive?       Yes (MAU/Ambulatory/Procedural Areas - Information given)    Current Medications (verified) Outpatient Encounter Medications as of 02/05/2023  Medication Sig   Amantadine HCl 100 MG tablet Take 100  mg by mouth 2 (two) times daily.   amLODipine (NORVASC) 5 MG tablet Take 1 tablet (5 mg total) by mouth daily.   atorvastatin (LIPITOR) 80 MG tablet TAKE 1 TABLET BY MOUTH ONCE DAILY. PLEASE SCHEDULE OFFICE VISIT FOR FURTHER REFILLS. THANK YOU!   B Complex Vitamins (VITAMIN B COMPLEX PO) Take by mouth daily.   benazepril (LOTENSIN) 40 MG tablet Take 1 tablet (40 mg total) by mouth daily.   carbidopa-levodopa (SINEMET CR) 50-200 MG tablet Take 1 tablet by mouth 2 (two) times daily.   carbidopa-levodopa (SINEMET IR) 25-100 MG tablet Take 2 tablets by mouth 3 (three) times daily.   carbidopa-levodopa-entacapone (STALEVO) 50-200-200 MG tablet Take 1 tablet by mouth in the morning and at bedtime.   Cyanocobalamin (VITAMIN B-12) 2500 MCG SUBL Place under the tongue daily.   Omega-3 Fatty Acids (OMEGA 3 PO) Take 1,400 mg by mouth daily.   omeprazole (PRILOSEC) 20 MG capsule TAKE 1 CAPSULE BY MOUTH EVERY DAY   Vitamin D, Ergocalciferol, (DRISDOL) 1.25 MG (50000 UT) CAPS capsule Take 1 capsule (50,000 Units total) by mouth every 7 (seven) days.   No facility-administered encounter medications on file as of 02/05/2023.    Allergies (verified) Donepezil and Penicillins   History: Past Medical History:  Diagnosis Date   Acute myocardial infarction of other inferior wall, episode of care unspecified 04/1993   Cancer ([place]    skin / SCC   Coronary atherosclerosis of native coronary artery    History of  shingles    waist area/right back side   Occlusion and stenosis of carotid artery without mention of cerebral infarction    Other and unspecified hyperlipidemia    Severe   Parkinson's disease    Peripheral vascular disease, unspecified ([place])    Precancerous skin lesion    Prostate cancer ([place]) 10/2016   low risk   Shingles 07/2014   Squamous cell cancer of skin of nose    Unspecified cerebral artery occlusion with cerebral infarction 2006   CVA   Unspecified essential hypertension    Past  Surgical History:  Procedure Laterality Date   BIOPSY PROSTATE  11/12/2017   CATARACT EXTRACTION W/PHACO Left 10/11/2021   Procedure: CATARACT EXTRACTION PHACO AND INTRAOCULAR LENS PLACEMENT ([place]) LEFT;  Surgeon: Leandrew Koyanagi, MD;  Location: [facility];  Service: Ophthalmology;  Laterality: Left;  5.15 01:01.6   CATARACT EXTRACTION W/PHACO Right 10/25/2021   Procedure: CATARACT EXTRACTION PHACO AND INTRAOCULAR LENS PLACEMENT ([place]) RIGHT;  Surgeon: Leandrew Koyanagi, MD;  Location: [facility];  Service: Ophthalmology;  Laterality: Right;  5.56 00:50.6   COLONOSCOPY WITH PROPOFOL N/A 02/11/2017   Procedure: COLONOSCOPY WITH PROPOFOL;  Surgeon: Manya Silvas, MD;  Location: [facility] ENDOSCOPY;  Service: Endoscopy;  Laterality: N/A;   HERNIA REPAIR  02/17/15   right inguinal hernia , laparoscopic placement of [place] 3-D max mesh   HERNIA REPAIR  3/316   ventral hernia, umbilical, open repair   MOHS SURGERY     SKIN BIOPSY     skin cancer   TRANSRECTAL ULTRASOUND  11/12/2017   Family History  Problem Relation Age of Onset   Hypertension Mother    Heart disease Father    Hypertension Sister    Hypertension Brother    Heart disease Other    Prostate cancer Neg Hx    Kidney cancer Neg Hx    Colon cancer Neg Hx    Pancreatic cancer Neg Hx    Breast cancer Neg Hx    Social History   Socioeconomic History   Marital status: Married    Spouse name: Not on file   Number of children: 3   Years of education: Not on file   Highest education level: Bachelor's degree (e.g., BA, AB, BS)  Occupational History   Occupation: GOLF TEACHER    Employer: [employer]    Comment: retired  Tobacco Use   Smoking status: Never   Smokeless tobacco: Never  Vaping Use   Vaping Use: Never used  Substance and Sexual Activity   Alcohol use: Yes    Alcohol/week: 21.0 standard drinks of alcohol    Types: 21 Glasses of wine per week    Comment: 3 glasses of wine/day    Drug use: No   Sexual activity: Not Currently  Other Topics Concern   Not on file  Social History Narrative   Married   Gets regular exercise   Social Determinants of Health   Financial Resource Strain: Low Risk  (02/05/2023)   Overall Financial Resource Strain (CARDIA)    Difficulty of Paying Living Expenses: Not hard at all  Food Insecurity: No Food Insecurity (01/11/2022)   Hunger Vital Sign    Worried About Running Out of Food in the Last Year: Never true    Ran Out of Food in the Last Year: Never true  Transportation Needs: No Transportation Needs (02/05/2023)   PRAPARE - Hydrologist (Medical): No    Lack of Transportation (Non-Medical):  No  Physical Activity: Sufficiently Active (02/05/2023)   Exercise Vital Sign    Days of Exercise per Week: 5 days    Minutes of Exercise per Session: 40 min  Stress: Stress Concern Present (02/05/2023)   [place]    Feeling of Stress : Rather much  Social Connections: Socially Integrated (02/05/2023)   Social Connection and Isolation Panel [NHANES]    Frequency of Communication with Friends and Family: More than three times a week    Frequency of Social Gatherings with Friends and Family: More than three times a week    Attends Religious Services: More than 4 times per year    Active Member of [employer] or Organizations: Yes    Attends Music therapist: More than 4 times per year    Marital Status: Married    Tobacco Counseling Counseling given: Not Answered   Clinical Intake:  Pre-visit preparation completed: Yes  Pain : No/denies pain     BMI - recorded: 24.38 Nutritional Status: BMI of 19-24  Normal Nutritional Risks: None Diabetes: No  How often do you need to have someone help you when you read instructions, pamphlets, or other written materials from your doctor or pharmacy?: 1 - Never  Diabetic?no  Interpreter  Needed?: No  Information entered by :: B.Wadell Craddock,LPN   Activities of Daily Living    02/05/2023    9:35 AM  In your present state of health, do you have any difficulty performing the following activities:  Hearing? 0  Vision? 0  Difficulty concentrating or making decisions? 0  Walking or climbing stairs? 0  Dressing or bathing? 0  Doing errands, shopping? 0  Preparing Food and eating ? N  Using the Toilet? N  In the past six months, have you accidently leaked urine? N  Do you have problems with loss of bowel control? N  Managing your Medications? N  Managing your Finances? N  Housekeeping or managing your Housekeeping? N    Patient Care Team: Eulas Post, MD as PCP - General (Family Medicine) Rockey Situ Kathlene November, MD as Consulting Physician (Cardiology) Vladimir Crofts, MD as Consulting Physician (Neurology) Alexis Frock, MD as Consulting Physician (Urology) Tyler Pita, MD as Consulting Physician (Radiation Oncology) Oneta Rack, MD (Dermatology) Leandrew Koyanagi, MD as Referring Physician (Ophthalmology)  Indicate any recent Medical Services you may have received from other than [facility] providers in the past year (date may be approximate).     Assessment:   This is a routine wellness examination for [place].  Hearing/Vision screen Hearing Screening - Comments:: Adequate hearing Vision Screening - Comments:: Adequate w/glasses. Dr [facility]  Dietary issues and exercise activities discussed: Current Exercise Habits: Home exercise routine, Type of exercise: walking (weights 3-4 days/wk), Time (Minutes): 40, Frequency (Times/Week): 5, Weekly Exercise (Minutes/Week): 200, Intensity: Mild, Exercise limited by: None identified   Goals Addressed             This Visit's Progress    Increase water intake   On track    Recommend to increase fluid intake to 6-8 glasses per day.     LIFESTYLE - DECREASE FALLS RISK   On track    Recommend  to remove any items from the home that may cause slips or trips.       Depression Screen    02/05/2023    9:32 AM 01/11/2022    3:03 PM 01/05/2021    9:56 AM 12/30/2019  10:48 AM 04/09/2019   10:04 AM 12/23/2018   10:37 AM 06/09/2018   12:49 PM  PHQ 2/9 Scores  PHQ - 2 Score 0 0 0 0 0 0 0    Fall Risk    02/05/2023    9:27 AM 01/11/2022    3:07 PM 01/05/2021    9:59 AM 12/30/2019   10:48 AM 04/09/2019   10:04 AM  Fall Risk   Falls in the past year? 0 1 1 1 $ 0  Number falls in past yr: 0 1 0 1   Injury with Fall? 0 0 0 0   Risk for fall due to : No Fall Risks History of fall(s)     Follow up Education provided;Falls prevention discussed Falls prevention discussed  Falls prevention discussed     FALL RISK PREVENTION PERTAINING TO THE HOME:  Any stairs in or around the home? Yes  If so, are there any without handrails? Yes  Home free of loose throw rugs in walkways, pet beds, electrical cords, etc? Yes  Adequate lighting in your home to reduce risk of falls? Yes   ASSISTIVE DEVICES UTILIZED TO PREVENT FALLS:  Life alert? No  Use of a cane, walker or w/c? No  Grab bars in the bathroom? Yes  Shower chair or bench in shower? No  Elevated toilet seat or a handicapped toilet? No    Cognitive Function:    10/06/2019   10:30 AM 09/26/2017    8:28 AM  MMSE - Mini Mental State Exam  Orientation to time 5 5  Orientation to Place 5 5  Registration 3 3  Attention/ Calculation 5 5  Recall 3 3  Language- name 2 objects 2 2  Language- repeat 1 1  Language- follow 3 step command 3 3  Language- read & follow direction 1 1  Write a sentence 1 1  Copy design 1 1  Total score 30 30        02/05/2023    9:36 AM 12/23/2018   10:43 AM 09/18/2016    9:45 AM  6CIT Screen  What Year? 0 points 0 points 0 points  What month? 0 points 0 points 0 points  What time? 0 points 0 points 3 points  Count back from 20 0 points 0 points 0 points  Months in reverse 0 points 0 points 0 points   Repeat phrase 2 points 0 points 0 points  Total Score 2 points 0 points 3 points    Immunizations Immunization History  Administered Date(s) Administered   Fluad Quad(high Dose 65+) 10/06/2019   Influenza, High Dose Seasonal PF 10/07/2018   Moderna Sars-Covid-2 Vaccination 12/24/2019, 02/05/2020, 12/27/2020   Pneumococcal Conjugate-13 04/24/2016   Pneumococcal Polysaccharide-23 10/07/2018   Tdap 04/24/2016    TDAP status: Up to date  Flu Vaccine status: Up to date  Pneumococcal vaccine status: Up to date  Covid-19 vaccine status: Completed vaccines  Qualifies for Shingles Vaccine? Yes   Zostavax completed No   Shingrix Completed?: No.    Education has been provided regarding the importance of this vaccine. Patient has been advised to call insurance company to determine out of pocket expense if they have not yet received this vaccine. Advised may also receive vaccine at local pharmacy or Health Dept. Verbalized acceptance and understanding.  Screening Tests Health Maintenance  Topic Date Due   Hepatitis C Screening  Never done   Zoster Vaccines- Shingrix (1 of 2) Never done   COLONOSCOPY (Pts 45-65yr Insurance coverage will  need to be confirmed)  02/11/2022   COVID-19 Vaccine (4 - 2023-24 season) 08/17/2022   Medicare Annual Wellness (AWV)  02/06/2024   DTaP/Tdap/Td (2 - Td or Tdap) 04/24/2026   Pneumonia Vaccine 89+ Years old  Completed   INFLUENZA VACCINE  Completed   HPV VACCINES  Aged Out    Health Maintenance  Health Maintenance Due  Topic Date Due   Hepatitis C Screening  Never done   Zoster Vaccines- Shingrix (1 of 2) Never done   COLONOSCOPY (Pts 45-21yr Insurance coverage will need to be confirmed)  02/11/2022   COVID-19 Vaccine (4 - 2023-24 season) 08/17/2022    Colorectal cancer screening: No longer required.   Lung Cancer Screening: (Low Dose CT Chest recommended if Age 39106-80years, 30 pack-year currently smoking OR have quit w/in 15years.) does not  qualify.   Lung Cancer Screening Referral: no  Additional Screening:  Hepatitis C Screening: does not qualify; Completed no  Vision Screening: Recommended annual ophthalmology exams for early detection of glaucoma and other disorders of the eye. Is the patient up to date with their annual eye exam?  Yes  due next month-appt made Who is the provider or what is the name of the office in which the patient attends annual eye exams? Dr [facility] If pt is not established with a provider, would they like to be referred to a provider to establish care? No .   Dental Screening: Recommended annual dental exams for proper oral hygiene  Community Resource Referral / Chronic Care Management: CRR required this visit?  No   CCM required this visit?  No      Plan:     I have personally reviewed and noted the following in the patient's chart:   Medical and social history Use of alcohol, tobacco or illicit drugs  Current medications and supplements including opioid prescriptions. Patient is not currently taking opioid prescriptions. Functional ability and status Nutritional status Physical activity Advanced directives List of other physicians Hospitalizations, surgeries, and ER visits in previous 12 months Vitals Screenings to include cognitive, depression, and falls Referrals and appointments  In addition, I have reviewed and discussed with patient certain preventive protocols, quality metrics, and best practice recommendations. A written personalized care plan for preventive services as well as general preventive health recommendations were provided to patient.     [facility] LPN   [id number]  Nurse Notes: pt is pleasant:states he is doing well. He does relay that after his walking the last 2 weeks his feet are burning. He relays he cannot see the bottom of his feet to see what it looks like after walking. Pt is patient of Dr GRosanna Randy Pt encouraged to [place] (to continue with him).

## 2023-02-14 ENCOUNTER — Ambulatory Visit: Payer: Self-pay | Admitting: *Deleted

## 2023-02-14 NOTE — Telephone Encounter (Signed)
  Chief Complaint: Wound on right leg looking infected Symptoms: Draining brown liquid and is sore Frequency: Fell 3-4 days ago and scrapped it on his dresser    (He has Parkinson's) Pertinent Negatives: Patient denies Fever or other injuries from the fall. Disposition: []$ ED /[]$ Urgent Care (no appt availability in office) / [x]$ Appointment(In office/virtual)/ []$  [facility]/ []$ Home Care/ []$ Refused Recommended Disposition /[]$ [facility]/ []$  Follow-up with PCP Additional Notes: Pt needed to leave or he was going to miss his dental appt. So he is going to call back and schedule an appt. When he is done with that appt.   He has not been seen since Dr. Miguel Aschoff left the practice.

## 2023-02-14 NOTE — Telephone Encounter (Signed)
Reason for Disposition  [1] Wound > 48 hours old AND [2] becoming more painful or tender to touch  Answer Assessment - Initial Assessment Questions 1. LOCATION: "Where is the wound located?"      Right leg between kneecap and foot.    I was going to bed I have Parkinson's, was up on knee and went backwards and hit the dresser with my knee.   I scrapped the skin.    I'm using Peroxide on it. Fell 3 days ago or longer maybe 3-4 days.  Denies sweats or chills.  2. WOUND APPEARANCE: "What does the wound look like?"      It's red and scrapped.  No pus or bleeding.     Brown stuff came up.    Started 2 days ago.    3. SIZE: If redness is present, ask: "What is the size of the red area?" (Inches, centimeters, or compare to size of a coin)      It's about the size of a quarter.     4. SPREAD: "What's changed in the last day?"  "Do you see any red streaks coming from the wound?"     It's draining brown stuff.    5. ONSET: "When did it start to look infected?"      2 days ago 6. MECHANISM: "How did the wound start, what was the cause?"     I fell and and scrapped my knee on the dresser when I was getting in bed.    I had my knee on the bed and slipped backwards. 7. PAIN: Do you have any pain?"  If Yes, ask: "How bad is the pain?"  (e.g., Scale 1-10; mild, moderate, or severe)    - MILD (1-3): Doesn't interfere with normal activities.     - MODERATE (4-7): Interferes with normal activities or awakens from sleep.    - SEVERE (8-10): Excruciating pain, unable to do any normal activities.       When I touch it it hurts. 8. FEVER: "Do you have a fever?" If Yes, ask: "What is your temperature, how was it measured, and when did it start?"     No 9. OTHER SYMPTOMS: "Do you have any other symptoms?" (e.g., shaking chills, weakness, rash elsewhere on body)     Denies sweating or chills or fever 10. PREGNANCY: "Is there any chance you are pregnant?" "When was your last menstrual period?"        N/A  Protocols used: Wound Infection Suspected-A-AH

## 2023-02-15 ENCOUNTER — Ambulatory Visit (INDEPENDENT_AMBULATORY_CARE_PROVIDER_SITE_OTHER): Payer: Medicare HMO | Admitting: Physician Assistant

## 2023-02-15 ENCOUNTER — Encounter: Payer: Self-pay | Admitting: Physician Assistant

## 2023-02-15 VITALS — BP 131/74 | HR 58 | Temp 97.8°F | Resp 20 | Wt 158.0 lb

## 2023-02-15 DIAGNOSIS — L03115 Cellulitis of right lower limb: Secondary | ICD-10-CM

## 2023-02-15 DIAGNOSIS — S81801A Unspecified open wound, right lower leg, initial encounter: Secondary | ICD-10-CM | POA: Diagnosis not present

## 2023-02-15 DIAGNOSIS — G629 Polyneuropathy, unspecified: Secondary | ICD-10-CM | POA: Diagnosis not present

## 2023-02-15 MED ORDER — SULFAMETHOXAZOLE-TRIMETHOPRIM 800-160 MG PO TABS: 1.0000 | ORAL_TABLET | Freq: Two times a day (BID) | ORAL | 0 refills | Status: AC

## 2023-02-15 NOTE — Progress Notes (Signed)
I,Sulibeya S Dimas,acting as a Education administrator for [employer], PA-C.,have documented all relevant documentation on the behalf of [name], PA-C,as directed by  [name], PA-C while in the presence of [name], PA-C.     Established patient visit   Patient: [name] [name].   DOB: 1946/05/19   77 y.o. Male  MRN: [id number] Visit Date: 02/15/2023  Today's healthcare provider: [name], PA-C   Chief Complaint  Patient presents with   Laceration   Subjective    HPI  Patient reports he fell at home a week ago at his home. He report hitting the a dresser and cut his lower right leg. He reports he has been cleaning and using neosporin on his leg. He reports redness, discharge and mild pain. Denies hitting his head during the injury.  Pt also reports b/l neuropathy that started in the last month. Reports he saw his neurologist last month but did not mention it as he was feeling fine. Reports pain on the bottom of b/l feet.   Medications: Outpatient Medications Prior to Visit  Medication Sig   Amantadine HCl 100 MG tablet Take 100 mg by mouth 2 (two) times daily.   amLODipine (NORVASC) 5 MG tablet Take 1 tablet (5 mg total) by mouth daily.   atorvastatin (LIPITOR) 80 MG tablet TAKE 1 TABLET BY MOUTH ONCE DAILY. PLEASE SCHEDULE OFFICE VISIT FOR FURTHER REFILLS. THANK YOU!   B Complex Vitamins (VITAMIN B COMPLEX PO) Take by mouth daily.   benazepril (LOTENSIN) 40 MG tablet Take 1 tablet (40 mg total) by mouth daily.   carbidopa-levodopa (SINEMET CR) 50-200 MG tablet Take 1 tablet by mouth 2 (two) times daily.   carbidopa-levodopa (SINEMET IR) 25-100 MG tablet Take 2 tablets by mouth 3 (three) times daily.   carbidopa-levodopa-entacapone (STALEVO) 50-200-200 MG tablet Take 1 tablet by mouth in the morning and at bedtime.   Cyanocobalamin (VITAMIN B-12) 2500 MCG SUBL Place under the tongue daily.   Omega-3 Fatty Acids (OMEGA 3 PO) Take 1,400 mg by mouth daily.   omeprazole  (PRILOSEC) 20 MG capsule TAKE 1 CAPSULE BY MOUTH EVERY DAY   Vitamin D, Ergocalciferol, (DRISDOL) 1.25 MG (50000 UT) CAPS capsule Take 1 capsule (50,000 Units total) by mouth every 7 (seven) days.   No facility-administered medications prior to visit.    Review of Systems  Constitutional:  Negative for chills and fever.  Respiratory:  Negative for chest tightness.   Gastrointestinal:  Negative for nausea and vomiting.  Skin:  Positive for wound.     Objective    BP 131/74 (BP Location: Left Arm, Patient Position: Sitting, Cuff Size: Normal)   Pulse (!) 58   Temp 97.8 F (36.6 C) (Temporal)   Resp 20   Wt 158 lb (71.7 kg)   BMI 24.38 kg/m    Physical Exam Vitals reviewed.  Constitutional:      Appearance: He is not ill-appearing.  HENT:     Head: Normocephalic.  Eyes:     Conjunctiva/sclera: Conjunctivae normal.  Cardiovascular:     Rate and Rhythm: Normal rate.  Pulmonary:     Effort: Pulmonary effort is normal. No respiratory distress.  Neurological:     General: No focal deficit present.     Mental Status: He is alert and oriented to person, place, and time.  Psychiatric:        Mood and Affect: Mood normal.        Behavior: Behavior normal.  No results found for any visits on 02/15/23.  Assessment & Plan     Wound right leg, cellulitis right leg  Dressed wound in office Rx bactrim ds pobid x 7 days Referred to wound clinic  2. Peripheral neuropathy Per pt, new Will check A1c and vit b12 Will likely encouraged to f/b neurology  Return if symptoms worsen or fail to improve.      I, [name], PA-C have reviewed all documentation for this visit. The documentation on 02/15/23 for the exam, diagnosis, procedures, and orders are all accurate and complete.  [name], PA-C [facility] [address] [place], [place], [address] Office: [phone] Fax: [place]

## 2023-02-16 LAB — HEMOGLOBIN A1C
Est. average glucose Bld gHb Est-mCnc: 111 mg/dL
Hgb A1c MFr Bld: 5.5 % (ref 4.8–5.6)

## 2023-02-16 LAB — VITAMIN B12: Vitamin B-12: 914 pg/mL (ref 232–1245)

## 2023-02-18 ENCOUNTER — Other Ambulatory Visit: Payer: Self-pay | Admitting: Physician Assistant

## 2023-02-18 DIAGNOSIS — G629 Polyneuropathy, unspecified: Secondary | ICD-10-CM

## 2023-02-19 ENCOUNTER — Encounter: Payer: Medicare HMO | Attending: Internal Medicine | Admitting: Internal Medicine

## 2023-02-19 DIAGNOSIS — I6529 Occlusion and stenosis of unspecified carotid artery: Secondary | ICD-10-CM | POA: Insufficient documentation

## 2023-02-19 DIAGNOSIS — Z8546 Personal history of malignant neoplasm of prostate: Secondary | ICD-10-CM | POA: Diagnosis not present

## 2023-02-19 DIAGNOSIS — I10 Essential (primary) hypertension: Secondary | ICD-10-CM | POA: Insufficient documentation

## 2023-02-19 DIAGNOSIS — L97211 Non-pressure chronic ulcer of right calf limited to breakdown of skin: Secondary | ICD-10-CM | POA: Insufficient documentation

## 2023-02-19 DIAGNOSIS — L97811 Non-pressure chronic ulcer of other part of right lower leg limited to breakdown of skin: Secondary | ICD-10-CM | POA: Insufficient documentation

## 2023-02-19 DIAGNOSIS — I252 Old myocardial infarction: Secondary | ICD-10-CM | POA: Diagnosis not present

## 2023-02-19 DIAGNOSIS — S80812A Abrasion, left lower leg, initial encounter: Secondary | ICD-10-CM | POA: Diagnosis not present

## 2023-02-19 DIAGNOSIS — Z923 Personal history of irradiation: Secondary | ICD-10-CM | POA: Diagnosis not present

## 2023-02-19 DIAGNOSIS — G20A1 Parkinson's disease without dyskinesia, without mention of fluctuations: Secondary | ICD-10-CM | POA: Insufficient documentation

## 2023-02-20 DIAGNOSIS — L97222 Non-pressure chronic ulcer of left calf with fat layer exposed: Secondary | ICD-10-CM | POA: Diagnosis not present

## 2023-02-22 NOTE — Progress Notes (Signed)
[name], [name] ([id number]) [id number] Page 1 of 9 Visit Report for 02/19/2023 Allergy List Details Patient Name: Date of Service: [name] [name], [name] [name]. 02/19/2023 8:45 A M Medical Record Number: [id number] Patient Account Number: [id number] Date of Birth/Sex: Treating RN: [birth year] (77 y.o. [name] [name]) [name] Primary Care Klinton Candelas: Miguel Aschoff Other Clinician: Referring Lahna Nath: Treating Eurika Sandy/Extender: RO BSO N, MICHA EL [place] Bailiff in Treatment: 0 Allergies Active Allergies donepezil penicillin Allergy Notes Electronic Signature(s) Signed: 02/20/2023 3:12:30 PM By: [name] RN Entered By: [name] on 02/19/2023 09:02:38 -------------------------------------------------------------------------------- Arrival Information Details Patient Name: Date of Service: [name] Mons CK W. 02/19/2023 8:45 A M Medical Record Number: [id number] Patient Account Number: [id number] Date of Birth/Sex: Treating RN: 1946-09-21 (77 y.o. [name] Primary Care Ludivina Guymon: Miguel Aschoff Other Clinician: Referring Rupa Lagan: Treating Dock Baccam/Extender: RO BSO N, MICHA EL [place] Bailiff in Treatment: 0 Visit Information Patient Arrived: Ambulatory Arrival Time: 09:01 Accompanied By: wife Transfer Assistance: None Patient Identification Verified: Yes Secondary Verification Process Completed: Yes Patient Requires Transmission-Based Precautions: No Patient Has Alerts: No Electronic Signature(s) Signed: 02/20/2023 3:12:30 PM By: [name] RN [name] ([id number]) [id number] Page 2 of 9 Entered By: [name] on 02/19/2023 09:01:18 -------------------------------------------------------------------------------- Clinic Level of Care Assessment Details Patient Name: Date of Service: RIGGIN, MORIMOTO. 02/19/2023 8:45 A M Medical Record Number: [id number] Patient Account Number: [id number] Date of Birth/Sex: Treating  RN: Apr 20, 1946 (77 y.o. [name] [name]) [name] Primary Care Teleah Villamar: Miguel Aschoff Other Clinician: Referring Savino Whisenant: Treating Lida Berkery/Extender: RO BSO N, MICHA EL [place] Bailiff in Treatment: 0 Clinic Level of Care Assessment Items TOOL 1 Quantity Score X- 1 0 Use when EandM and Procedure is performed on INITIAL visit ASSESSMENTS - Nursing Assessment / Reassessment X- 1 20 General Physical Exam (combine w/ comprehensive assessment (listed just below) when performed on new pt. evals) X- 1 25 Comprehensive Assessment (HX, ROS, Risk Assessments, Wounds Hx, etc.) ASSESSMENTS - Wound and Skin Assessment / Reassessment '[]'$  - 0 Dermatologic / Skin Assessment (not related to wound area) ASSESSMENTS - Ostomy and/or Continence Assessment and Care '[]'$  - 0 Incontinence Assessment and Management '[]'$  - 0 Ostomy Care Assessment and Management (repouching, etc.) PROCESS - Coordination of Care X - Simple Patient / Family Education for ongoing care 1 15 '[]'$  - 0 Complex (extensive) Patient / Family Education for ongoing care '[]'$  - 0 Staff obtains Programmer, systems, Records, T Results / Process Orders est '[]'$  - 0 Staff telephones HHA, Nursing Homes / Clarify orders / etc '[]'$  - 0 Routine Transfer to another Facility (non-emergent condition) '[]'$  - 0 Routine Hospital Admission (non-emergent condition) X- 1 15 New Admissions / Biomedical engineer / Ordering NPWT Apligraf, etc. , '[]'$  - 0 Emergency Hospital Admission (emergent condition) PROCESS - Special Needs '[]'$  - 0 Pediatric / Minor Patient Management '[]'$  - 0 Isolation Patient Management '[]'$  - 0 Hearing / Language / Visual special needs '[]'$  - 0 Assessment of Community assistance (transportation, D/C planning, etc.) '[]'$  - 0 Additional assistance / Altered mentation '[]'$  - 0 Support Surface(s) Assessment (bed, cushion, seat, etc.) INTERVENTIONS - Miscellaneous '[]'$  - 0 External ear exam '[]'$  - 0 Patient Transfer (multiple staff / Civil Service fast streamer /  Similar devices) '[]'$  - 0 Simple Staple / Suture removal (25 or less) '[]'$  - 0 Complex Staple / Suture removal (26 or more) AYYUB, DILLIE ([id number]) [id number] Page 3 of 9 '[]'$  - 0 Hypo/Hyperglycemic Management (do not check if billed separately) X- 1 15  Ankle / Brachial Index (ABI) - do not check if billed separately Has the patient been seen at the hospital within the last three years: Yes Total Score: 90 Level Of Care: New/Established - Level 3 Electronic Signature(s) Signed: 02/20/2023 3:12:30 PM By: [name] RN Entered By: [name] on 02/19/2023 09:50:02 -------------------------------------------------------------------------------- Encounter Discharge Information Details Patient Name: Date of Service: [name] Mons CK W. 02/19/2023 8:45 A M Medical Record Number: [id number] Patient Account Number: [id number] Date of Birth/Sex: Treating RN: Jul 06, 1946 (77 y.o. [name] [name]) [name] Primary Care Sheniece Ruggles: Miguel Aschoff Other Clinician: Referring Tayshaun Kroh: Treating Patte Winkel/Extender: RO BSO N, MICHA EL [place] Bailiff in Treatment: 0 Encounter Discharge Information Items Post Procedure Vitals Discharge Condition: Stable Temperature (F): 98.1 Ambulatory Status: Ambulatory Pulse (bpm): 57 Discharge Destination: Home Respiratory Rate (breaths/min): 18 Transportation: Private Auto Blood Pressure (mmHg): 131/70 Accompanied By: wife Schedule Follow-up Appointment: Yes Clinical Summary of Care: Patient Declined Electronic Signature(s) Signed: 02/20/2023 3:12:30 PM By: [name] RN Entered By: [name] on 02/19/2023 09:51:49 -------------------------------------------------------------------------------- Lower Extremity Assessment Details Patient Name: Date of Service: [name], [name] CK W. 02/19/2023 8:45 A M Medical Record Number: [id number] Patient Account Number: [id number] Date of Birth/Sex: Treating RN: Nov 19, 1946 (77 y.o. [name] Primary Care Harrison Zetina: Miguel Aschoff Other Clinician: Referring Amarri Satterly: Treating Dwayn Moravek/Extender: RO BSO N, MICHA EL [place] Bailiff in Treatment: 0 Edema Assessment Assessed: [Left: No] [Right: No] Edema: [Left: N] [Right: o] L[LeftVENNIE, GIAMANCO [id number] [Right: [id number] Page 4 of 9] Calf Left: Right: Point of Measurement: 33 cm From Medial Instep 33 cm Ankle Left: Right: Point of Measurement: 10 cm From Medial Instep 22 cm Knee To Floor Left: Right: From Medial Instep 43 cm Vascular Assessment Pulses: Dorsalis Pedis Palpable: [Right:Yes] Doppler Audible: [Right:Yes] Blood Pressure: Brachial: [Right:131] Ankle: [Right:Dorsalis Pedis: 80 0.61] Electronic Signature(s) Signed: 02/20/2023 3:12:30 PM By: [name] RN Entered By: [name] on 02/19/2023 09:26:15 -------------------------------------------------------------------------------- Multi Wound Chart Details Patient Name: Date of Service: [name] Mons CK W. 02/19/2023 8:45 A M Medical Record Number: [id number] Patient Account Number: [id number] Date of Birth/Sex: Treating RN: October 14, 1946 (76 y.o. [name] [name]) [name] Primary Care Kord Monette: Miguel Aschoff Other Clinician: Referring Artasia Thang: Treating Suhan Paci/Extender: RO BSO N, MICHA EL [place] Bailiff in Treatment: 0 Vital Signs Height(in): Pulse(bpm): 53 Weight(lbs): Blood Pressure(mmHg): 131/70 Body Mass Index(BMI): Temperature(F): 98.1 Respiratory Rate(breaths/min): 18 [1:Photos:] [N/A:N/A] Left, Anterior Lower Leg N/A N/A Wound Location: Trauma N/A N/A Wounding Event: Abrasion N/A N/A Primary Etiology: Hypertension, Myocardial Infarction, N/A N/A Comorbid HistoryEMETERIO, KLAES ([id number]) [id number] Page 5 of 9 Peripheral Venous Disease, Received Radiation 02/11/2023 N/A N/A Date Acquired: 0 N/A N/A Weeks of Treatment: Open N/A N/A Wound Status: No N/A  N/A Wound Recurrence: 6.5x3.5x0.1 N/A N/A Measurements L x W x D (cm) 17.868 N/A N/A A (cm) : rea 1.787 N/A N/A Volume (cm) : Full Thickness Without Exposed N/A N/A Classification: Support Structures Medium N/A N/A Exudate Amount: Serosanguineous N/A N/A Exudate Type: red, brown N/A N/A Exudate Color: Medium (34-66%) N/A N/A Granulation Amount: Pink N/A N/A Granulation Quality: Medium (34-66%) N/A N/A Necrotic Amount: Fat Layer (Subcutaneous Tissue): Yes N/A N/A Exposed Structures: Fascia: No Tendon: No Muscle: No Joint: No Bone: No Treatment Notes Electronic Signature(s) Signed: 02/20/2023 3:12:30 PM By: [name] RN Entered By: [name] on 02/19/2023 09:26:37 -------------------------------------------------------------------------------- Multi-Disciplinary Care Plan Details Patient Name: Date of Service: [name] Mons CK W. 02/19/2023 8:45 A M Medical Record Number: [id number] Patient Account Number:  [id number] Date of Birth/Sex: Treating RN: 1946/03/13 (76 y.o. [name] [name]) [name] Primary Care Nayleah Gamel: Miguel Aschoff Other Clinician: Referring Iveliz Garay: Treating Beatriz Quintela/Extender: RO BSO N, MICHA EL [place] Bailiff in Treatment: 0 Active Inactive Abuse / Safety / Falls / Self Care Management Nursing Diagnoses: Potential for injury related to falls Goals: Patient will not experience any injury related to falls Date Initiated: 02/19/2023 Target Resolution Date: 03/22/2023 Goal Status: Active Interventions: Assess Activities of Daily Living upon admission and as needed Assess fall risk on admission and as needed Assess: immobility, friction, shearing, incontinence upon admission and as needed Assess impairment of mobility on admission and as needed per policy Assess personal safety and home safety (as indicated) on admission and as needed Assess self care needs on admission and as needed Notes: DEKON, TANN ([id number])  [phone].pdf Page 6 of 9 Electronic Signature(s) Signed: 02/20/2023 3:12:30 PM By: [name] RN Entered By: [name] on 02/19/2023 09:27:29 -------------------------------------------------------------------------------- Pain Assessment Details Patient Name: Date of Service: [name] Mons CK W. 02/19/2023 8:45 A M Medical Record Number: [id number] Patient Account Number: [id number] Date of Birth/Sex: Treating RN: 01-11-1946 (76 y.o. [name] [name]) [name] Primary Care Charlayne Vultaggio: Miguel Aschoff Other Clinician: Referring Izaha Shughart: Treating Hadassah Rana/Extender: RO BSO N, MICHA EL [place] Bailiff in Treatment: 0 Active Problems Location of Pain Severity and Description of Pain Patient Has Paino No Site Locations Pain Management and Medication Current Pain Management: Electronic Signature(s) Signed: 02/20/2023 3:12:30 PM By: [name] RN Entered By: [name] on 02/19/2023 09:01:31 -------------------------------------------------------------------------------- Patient/Caregiver Education Details Patient Name: Date of Service: Deatra Ina 3/5/2024andnbsp8:45 A Isaias Sakai ([id number]) [id number] Page 7 of 9 Medical Record Number: [id number] Patient Account Number: [id number] Date of Birth/Gender: Treating RN: 11/04/1946 (76 y.o. [name] [name]) [name] Primary Care Physician: Miguel Aschoff Other Clinician: Referring Physician: Treating Physician/Extender: RO BSO N, MICHA EL [place] Bailiff in Treatment: 0 Education Assessment Education Provided To: Patient Education Topics Provided Welcome T [facility]New [facility] Packet: o Methods: Explain/Verbal Responses: State content correctly Wound/Skin Impairment: Electronic Signature(s) Signed: 02/20/2023 3:12:30 PM By: [name] RN Entered By: [name] on 02/19/2023  09:27:45 -------------------------------------------------------------------------------- Wound Assessment Details Patient Name: Date of Service: [name] Mons CK W. 02/19/2023 8:45 A M Medical Record Number: [id number] Patient Account Number: [id number] Date of Birth/Sex: Treating RN: [birth year] (76 y.o. [name] [name]) [name] Primary Care Sherie Dobrowolski: Miguel Aschoff Other Clinician: Referring Kirklin Mcduffee: Treating Iziah Cates/Extender: RO BSO N, MICHA EL [place] Bailiff in Treatment: 0 Wound Status Wound Number: 1 Primary Abrasion Etiology: Wound Location: Left, Anterior Lower Leg Wound Open Wounding Event: Trauma Status: Date Acquired: 02/11/2023 Comorbid Hypertension, Myocardial Infarction, Peripheral Venous Weeks Of Treatment: 0 History: Disease, Received Radiation Clustered Wound: No Photos Wound Measurements Length: (cm) 6.5 Width: (cm) 3.5 Depth: (cm) 0.1 Area: (cm) 17.868 Volume: (cm) 1.787 Kyllonen, Alaa W ([id number]) % Reduction in Area: % Reduction in Volume: Tunneling: No Undermining: No [phone] [phone].pdf Page 8 of 9 Wound Description Classification: Full Thickness Without Exposed Suppor Exudate Amount: Medium Exudate Type: Serosanguineous Exudate Color: red, brown t Structures Foul Odor After Cleansing: No Slough/Fibrino Yes Wound Bed Granulation Amount: Medium (34-66%) Exposed Structure Granulation Quality: Pink Fascia Exposed: No Necrotic Amount: Medium (34-66%) Fat Layer (Subcutaneous Tissue) Exposed: Yes Necrotic Quality: Adherent Slough Tendon Exposed: No Muscle Exposed: No Joint Exposed: No Bone Exposed: No Treatment Notes Wound #1 (Lower Leg) Wound Laterality: Left, Anterior Cleanser Byram Ancillary Kit - 15 Day Supply Discharge Instruction: Use  supplies as instructed; Kit contains: (15) Saline Bullets; (15) 3x3 Gauze; 15 pr Gloves Soap and Water Discharge Instruction: Gently cleanse wound with antibacterial soap, rinse and pat  dry prior to dressing wounds Peri-Wound Care Topical Primary Dressing Hydrofera Blue Ready Transfer Foam, 4x5 (in/in) Discharge Instruction: Apply Hydrofera Blue Ready to wound bed as directed Secondary Dressing (BORDER) Zetuvit Plus SILICONE BORDER Dressing 5x5 (in/in) Discharge Instruction: Please do not put silicone bordered dressings under wraps. Use non-bordered dressing only. Secured With Compression Wrap Compression Stockings Environmental education officer) Signed: 02/20/2023 3:12:30 PM By: [name] RN Entered By: [name] on 02/19/2023 09:24:49 -------------------------------------------------------------------------------- Vitals Details Patient Name: Date of Service: [name] Mons CK W. 02/19/2023 8:45 A M Medical Record Number: [id number] Patient Account Number: [id number] Date of Birth/Sex: Treating RN: 1946-07-15 (76 y.o. [name] Primary Care Ruqayya Ventress: Miguel Aschoff Other Clinician: Referring Letita Prentiss: Treating Eulalie Speights/Extender: RO BSO N, MICHA EL [place] Bailiff in Treatment: 0 Vital [address] KEMARRION, OKRAY ([id number]) [id number] Page 9 of 9 Time Taken: 09:00 Temperature (F): 98.1 Pulse (bpm): 57 Respiratory Rate (breaths/min): 18 Blood Pressure (mmHg): 131/70 Reference Range: 80 - 120 mg / dl Electronic Signature(s) Signed: 02/20/2023 3:12:30 PM By: [name] RN Entered By: [name] on 02/19/2023 09:02:08

## 2023-02-22 NOTE — Progress Notes (Signed)
[name] [name] ([id number]) [id number].pdf Page 1 of 5 Visit Report for 02/19/2023 Abuse Risk Screen Details Patient Name: Date of Service: [name] [name], [name] [name]. 02/19/2023 8:45 A M Medical Record Number: [id number] Patient Account Number: [id number] Date of Birth/Sex: Treating RN: 1945-12-28 (76 y.o. [name] [name]) [name] Primary Care [name] [name]: [name] [name] Other Clinician: Referring [name] [name]: Treating Claris Pech/Extender: RO BSO N, [name] [name] in Treatment: 0 Abuse Risk Screen Items Answer ABUSE RISK SCREEN: Has anyone close to you tried to hurt or harm you recentlyo No Do you feel uncomfortable with anyone in your familyo No Has anyone forced you do things that you didnt want to doo No Electronic Signature(s) Signed: 02/20/2023 3:12:30 PM By: [name] RN Entered By: [name] on 02/19/2023 09:06:10 -------------------------------------------------------------------------------- Activities of Daily Living Details Patient Name: Date of Service: [name] [name], [name] [name] 02/19/2023 8:45 A M Medical Record Number: [id number] Patient Account Number: [id number] Date of Birth/Sex: Treating RN: [birth year] (76 y.o. [name] [name]) [name] Primary Care [name] [name]: [name] [name] Other Clinician: Referring [name] [name]: Treating [name] [name]: RO BSO N, [name] [name] in Treatment: 0 Activities of Daily Living Items Answer Activities of Daily Living (Please select one for each item) Drive Automobile Completely Able T Medications ake Completely Able Use T elephone Completely Able Care for Appearance Need Assistance Use T oilet Completely Able Bath / Shower Completely Able Dress Self Completely Able Feed Self Completely Able Walk Completely Able Get In / Out Bed Completely Able Housework Completely [name] [name] ([id number]) [phone] [id number].pdf Page 2 of 5 Prepare Meals Completely Able Handle Money Completely  Able Shop for Self Completely Able Electronic Signature(s) Signed: 02/20/2023 3:12:30 PM By: [name] RN Entered By: [name] on 02/19/2023 09:06:50 -------------------------------------------------------------------------------- Education Screening Details Patient Name: Date of Service: [name] Mons CK W. 02/19/2023 8:45 A M Medical Record Number: [id number] Patient Account Number: [id number] Date of Birth/Sex: Treating RN: [birth year] (76 y.o. [name] [name]) [name] Primary Care [name] [name]: [name] [name] Other Clinician: Referring [name] [name]: Treating [name] [name]/Extender: RO BSO N, [name] [name] in Treatment: 0 Primary Learner Assessed: Patient Learning Preferences/Education Level/Primary Language Learning Preference: Explanation Highest Education Level: College or Above Preferred Language: English Cognitive Barrier Language Barrier: No Translator Needed: No Memory Deficit: No Emotional Barrier: No Cultural/Religious Beliefs Affecting Medical Care: No Physical Barrier Impaired Vision: Yes Glasses Impaired Hearing: No Decreased Hand dexterity: No Knowledge/Comprehension Knowledge Level: Medium Comprehension Level: High Ability to understand written instructions: High Ability to understand verbal instructions: High Motivation Anxiety Level: Anxious Cooperation: Cooperative Education Importance: Acknowledges Need Interest in Health Problems: Asks Questions Perception: Coherent Willingness to Engage in Self-Management High Activities: Readiness to Engage in Self-Management High Activities: Electronic Signature(s) Signed: 02/20/2023 3:12:30 PM By: [name] RN Entered By: [name] on 02/19/2023 09:07:41 [name] [name] ([id number]) [phone] [id number].pdf Page 3 of 5 -------------------------------------------------------------------------------- Fall Risk Assessment Details Patient Name: Date of Service: [name] [name], [name] [name]. 02/19/2023 8:45  A M Medical Record Number: [id number] Patient Account Number: [id number] Date of Birth/Sex: Treating RN: 07/12/1946 (76 y.o. [name] [name]) [name] Primary Care [name] [name]: [name] [name] Other Clinician: Referring [name] [name]: Treating [name] [name]: RO BSO N, [name] [name] in Treatment: 0 Fall Risk Assessment Items Have you had 2 or more falls in the last 12 monthso 0 Yes Have you had any fall that resulted in injury in the last 12 monthso 0 Yes FALLS RISK SCREEN History of falling - immediate or within 3 months 25  Yes Secondary diagnosis (Do you have 2 or more medical diagnoseso) 0 No Ambulatory aid None/bed rest/wheelchair/nurse 0 Yes Crutches/cane/walker 0 No Furniture 0 No Intravenous therapy Access/Saline/Heparin Lock 0 No Gait/Transferring Normal/ bed rest/ wheelchair 0 Yes Weak (short steps with or without shuffle, stooped but able to lift head while walking, may seek 0 No support from furniture) Impaired (short steps with shuffle, may have difficulty arising from chair, head down, impaired 0 No balance) Mental Status Oriented to own ability 0 Yes Electronic Signature(s) Signed: 02/20/2023 3:12:30 PM By: [name] RN Entered By: [name] on 02/19/2023 09:08:05 -------------------------------------------------------------------------------- Foot Assessment Details Patient Name: Date of Service: [name] Mons CK W. 02/19/2023 8:45 A M Medical Record Number: [id number] Patient Account Number: [id number] Date of Birth/Sex: Treating RN: November 14, 1946 (76 y.o. [name] [name]) [name] Primary Care Dnasia Gauna: [name] [name] Other Clinician: Referring [name] [name]: Treating [name] [name]/Extender: RO BSO N, [name] [name] in Treatment: 0 Foot Assessment Items [address] [name] [name], [name] [name] ([id number]) [phone] [id number].pdf Page 4 of 5 + = Sensation present, - = Sensation absent, C = Callus, U = Ulcer R = Redness, W = Warmth, M = Maceration,  PU = Pre-ulcerative lesion F = Fissure, S = Swelling, D = Dryness Assessment Right: Left: Other Deformity: No No Prior Foot Ulcer: No No Prior Amputation: No No Charcot Joint: No No Ambulatory Status: Ambulatory Without Help Gait: Steady Electronic Signature(s) Signed: 02/20/2023 3:12:30 PM By: [name] RN Entered By: [name] on 02/19/2023 09:22:34 -------------------------------------------------------------------------------- Nutrition Risk Screening Details Patient Name: Date of Service: [name] [name], [name] [name] 02/19/2023 8:45 A M Medical Record Number: [id number] Patient Account Number: [id number] Date of Birth/Sex: Treating RN: 1946-11-12 (76 y.o. [name] Primary Care Monita Swier: [name] [name] Other Clinician: Referring Anastasiya Gowin: Treating Kasi Lasky/Extender: RO BSO N, [name] [name] in Treatment: 0 Height (in): Weight (lbs): Body Mass Index (BMI): Nutrition Risk Screening Items Score Screening NUTRITION RISK SCREEN: I have an illness or condition that made me change the kind and/or amount of food I eat 0 No I eat fewer than two meals per day 0 No I eat few fruits and vegetables, or milk products 0 No I have three or more drinks of beer, liquor or wine almost every day 0 No I have tooth or mouth problems that make it hard for me to eat 0 No I don't always have enough money to buy the food I need 0 No [name] [name], [name] [name] ([id number]) [phone] [id number].pdf Page 5 of 5 I eat alone most of the time 0 No I take three or more different prescribed or over-the-counter drugs a day 1 Yes Without wanting to, I have lost or gained 10 pounds in the last six months 0 No I am not always physically able to shop, cook and/or feed myself 0 No Nutrition Protocols Good Risk Protocol 0 No interventions needed Moderate Risk Protocol High Risk Proctocol Risk Level: Good Risk Score: 1 Electronic Signature(s) Signed: 02/20/2023 3:12:30 PM By: [name] RN Entered By: [name] on 02/19/2023 09:08:43

## 2023-02-22 NOTE — Progress Notes (Signed)
[name] [name] ([id number]) [id number] Page 1 of 8 Visit Report for 02/19/2023 Chief Complaint Document Details Patient Name: Date of Service: [name] [name], [name] [name]. 02/19/2023 8:45 A M Medical Record Number: [id number] Patient Account Number: [id number] Date of Birth/Sex: Treating RN: [name] [name]-04-[name] (77 y.o. [name] [name]) [name] Primary Care Provider: Miguel [name] Other Clinician: Referring Provider: Treating Provider/Extender: RO BSO N, MICHA EL [place] Bailiff in Treatment: 0 Information Obtained from: Patient Chief Complaint ; 02/20/2023. Patient is here for review of an abrasion injury on the right anterior mid tibia Electronic Signature(s) Signed: 02/19/2023 4:03:34 PM By: Linton Ham MD Entered By: Linton Ham on 02/19/2023 09:54:16 -------------------------------------------------------------------------------- Debridement Details Patient Name: Date of Service: [name] Mons CK W. 02/19/2023 8:45 A M Medical Record Number: [id number] Patient Account Number: [id number] Date of Birth/Sex: Treating RN: [name] [name], [name] [name] (77 y.o. [name] [name]) [name] Primary Care Provider: Miguel [name] Other Clinician: Referring Provider: Treating Provider/Extender: RO BSO N, MICHA EL [place] Bailiff in Treatment: 0 Debridement Performed for Assessment: Wound #1 Left,Anterior Lower Leg Performed By: Physician [name] Dillon, MD Debridement Type: Debridement Level of Consciousness (Pre-procedure): Awake and Alert Pre-procedure Verification/Time Out Yes - 09:40 Taken: Start Time: 09:40 T Area Debrided (L x W): otal 3.5 (cm) x 2 (cm) = 7 (cm) Tissue and other material debrided: Viable, Non-Viable, Slough, Subcutaneous, Slough Level: Skin/Subcutaneous Tissue Debridement Description: Excisional Instrument: Curette Bleeding: Minimum Hemostasis Achieved: Pressure End Time: 09:43 Procedural Pain: 0 Post Procedural Pain: 0 Response to Treatment: Procedure was tolerated  well Level of Consciousness [name], [name] ([id number]) [id number] Page 2 of 8 Level of Consciousness (Post- Awake and Alert procedure): Post Debridement Measurements of Total Wound Length: (cm) 6.5 Width: (cm) 3.5 Depth: (cm) 0.1 Volume: (cm) 1.787 Character of Wound/Ulcer Post Debridement: Improved Post Procedure Diagnosis Same as Pre-procedure Electronic Signature(s) Signed: 02/19/2023 4:03:34 PM By: Linton Ham MD Signed: 02/20/2023 3:12:30 PM By: [name] RN Entered By: [name] on 02/19/2023 09:50:39 -------------------------------------------------------------------------------- HPI Details Patient Name: Date of Service: [name] Mons CK W. 02/19/2023 8:45 A M Medical Record Number: [id number] Patient Account Number: [id number] Date of Birth/Sex: Treating RN: [name] [name] (77 y.o. [name] [name] Primary Care Provider: Miguel [name] Other Clinician: Referring Provider: Treating Provider/Extender: RO BSO N, MICHA EL [place] Bailiff in Treatment: 0 History of Present Illness HPI Description: Admission 02/17/2022 This is a patient who fell in his room about a week and a half ago. He saw his primary doctor was felt to have cellulitis of the right leg. Noticed to have a wound of the right leg. He has been applying Neosporin since 3/1. Also was placed on Bactrim DS 4 days ago. He is not in a lot of pain. No prior wound history. He has a history of PAD, Parkinson's, B12 deficiency, prostate cancer, nonrheumatic mitral valve disease, MI 1994, B12 deficiency, history of Mohs surgery followed by dermatology and carotid artery stenosis ABI on the right leg was 0.61. Electronic Signature(s) Signed: 02/19/2023 4:03:34 PM By: Linton Ham MD Entered By: Linton Ham on 02/19/2023 09:56:43 -------------------------------------------------------------------------------- Physical Exam Details Patient Name: Date of Service: [name] Mons CK W.  02/19/2023 8:45 A [name] [name] ([id number]) [id number] Page 3 of 8 Medical Record Number: [id number] Patient Account Number: [id number] Date of Birth/Sex: Treating RN: [name] [name], [name] [name] (77 y.o. [name] [name]) [name] Primary Care Provider: Miguel [name] Other Clinician: Referring Provider: Treating Provider/Extender: RO BSO N, MICHA EL [place] Bailiff in Treatment: 0 Constitutional Sitting or standing Blood Pressure  is within target range for patient.. Pulse regular and within target range for patient.Marland Kitchen Respirations regular, non-labored and within target range.. Temperature is normal and within the target range for the patient.Marland Kitchen appears in no distress. Cardiovascular Pedal pulses are slightly reduced on the right but palpable.. Notes Wound exam; right anterior mid tibia. The wound does not look bad in terms of surface no purulence minimal surrounding erythema. I used a #3 curette to remove the circumference of the wound. No considerable evidence of infection today. Electronic Signature(s) Signed: 02/19/2023 4:03:34 PM By: Linton Ham MD Entered By: Linton Ham on 02/19/2023 09:58:38 -------------------------------------------------------------------------------- Physician Orders Details Patient Name: Date of Service: [name] Mons CK W. 02/19/2023 8:45 A M Medical Record Number: [id number] Patient Account Number: [id number] Date of Birth/Sex: Treating RN: [name] [name], [name] [name] (77 y.o. [name] [name] Primary Care Provider: Miguel [name] Other Clinician: Referring Provider: Treating Provider/Extender: RO BSO N, MICHA EL [place] Bailiff in Treatment: 0 Verbal / Phone Orders: No Diagnosis Coding Follow-up Appointments Return Appointment in 1 week. Bathing/ Shower/ Hygiene May shower; gently cleanse wound with antibacterial soap, rinse and pat dry prior to dressing wounds Edema Control - Lymphedema / Segmental Compressive Device / Other Elevate, Exercise  Daily and A void Standing for Long Periods of Time. Elevate legs to the level of the heart and pump ankles as often as possible Elevate leg(s) parallel to the floor when sitting. Wound Treatment Wound #1 - Lower Leg Wound Laterality: Left, Anterior Cleanser: Byram Ancillary Kit - 15 Day Supply (DME) (Generic) Every Other Day/30 Days Discharge Instructions: Use supplies as instructed; Kit contains: (15) Saline Bullets; (15) 3x3 Gauze; 15 pr Gloves Cleanser: Soap and Water Every Other Day/30 Days Discharge Instructions: Gently cleanse wound with antibacterial soap, rinse and pat dry prior to dressing wounds Prim Dressing: Hydrofera Blue Ready Transfer Foam, 4x5 (in/in) Every Other Day/30 Days ary Discharge Instructions: Apply Hydrofera Blue Ready to wound bed as directed Secondary Dressing: (BORDER) Zetuvit Plus SILICONE BORDER Dressing 5x5 (in/in) (DME) (Generic) Every Other Day/30 Days Discharge Instructions: Please do not put silicone bordered dressings under wraps. Use non-bordered dressing only. Electronic Signature(s) TAURUS, MAJKUT ([id number]) [id number] Page 4 of 8 Signed: 02/19/2023 4:03:34 PM By: Linton Ham MD Signed: 02/20/2023 3:12:30 PM By: [name] RN Entered By: [name] on 02/19/2023 09:44:41 -------------------------------------------------------------------------------- Problem List Details Patient Name: Date of Service: [name] Mons CK W. 02/19/2023 8:45 A M Medical Record Number: [id number] Patient Account Number: [id number] Date of Birth/Sex: Treating RN: [name] [name] (76 y.o. [name] [name]) [name] Primary Care Provider: Miguel [name] Other Clinician: Referring Provider: Treating Provider/Extender: RO BSO N, MICHA EL [place] Bailiff in Treatment: 0 Active Problems ICD-10 Encounter Code Description Active Date MDM Diagnosis S80.811D Abrasion, right lower leg, subsequent encounter 02/19/2023 No Yes L97.211 Non-pressure chronic ulcer  of right calf limited to breakdown of skin 02/19/2023 No Yes Inactive Problems Resolved Problems Electronic Signature(s) Signed: 02/19/2023 4:03:34 PM By: Linton Ham MD Entered By: Linton Ham on 02/19/2023 09:52:41 -------------------------------------------------------------------------------- Progress Note Details Patient Name: Date of Service: [name] Mons CK W. 02/19/2023 8:45 A M Medical Record Number: [id number] Patient Account Number: [id number] Date of Birth/Sex: Treating RN: [name] [name] (76 y.o. [name] [name] Primary Care Provider: Miguel [name] Other Clinician: Referring Provider: Treating Provider/Extender: RO BSO N, MICHA EL [place] Bailiff in Treatment: [address] [name] [name], LAURITSEN ([id number]) [id number] Page 5 of 8 Chief Complaint Information obtained from Patient ; 02/20/2023. Patient is here for review of an abrasion injury  on the right anterior mid tibia History of Present Illness (HPI) Admission 02/17/2022 This is a patient who fell in his room about a week and a half ago. He saw his primary doctor was felt to have cellulitis of the right leg. Noticed to have a wound of the right leg. He has been applying Neosporin since 3/1. Also was placed on Bactrim DS 4 days ago. He is not in a lot of pain. No prior wound history. He has a history of PAD, Parkinson's, B12 deficiency, prostate cancer, nonrheumatic mitral valve disease, MI 1994, B12 deficiency, history of Mohs surgery followed by dermatology and carotid artery stenosis ABI on the right leg was 0.61. Patient History Allergies donepezil, penicillin Social History Never smoker, Marital Status - Married, Alcohol Use - Daily, Drug Use - No History, Caffeine Use - Moderate. Medical History Cardiovascular Patient has history of Hypertension, Myocardial Infarction, Peripheral Venous Disease Oncologic Patient has history of Received Radiation - 2020 Review of Systems  (ROS) Eyes Complains or has symptoms of Vision Changes. Oncologic prostat Objective Constitutional Sitting or standing Blood Pressure is within target range for patient.. Pulse regular and within target range for patient.Marland Kitchen Respirations regular, non-labored and within target range.. Temperature is normal and within the target range for the patient.Marland Kitchen appears in no distress. Vitals Time Taken: 9:00 AM, Temperature: 98.1 F, Pulse: 57 bpm, Respiratory Rate: 18 breaths/min, Blood Pressure: 131/70 mmHg. Cardiovascular Pedal pulses are slightly reduced on the right but palpable.. General Notes: Wound exam; right anterior mid tibia. The wound does not look bad in terms of surface no purulence minimal surrounding erythema. I used a #3 curette to remove the circumference of the wound. No considerable evidence of infection today. Integumentary (Hair, Skin) Wound #1 status is Open. Original cause of wound was Trauma. The date acquired was: 02/11/2023. The wound is located on the Left,Anterior Lower Leg. The wound measures 6.5cm length x 3.5cm width x 0.1cm depth; 17.868cm^2 area and 1.787cm^3 volume. There is Fat Layer (Subcutaneous Tissue) exposed. There is no tunneling or undermining noted. There is a medium amount of serosanguineous drainage noted. There is medium (34-66%) pink granulation within the wound bed. There is a medium (34-66%) amount of necrotic tissue within the wound bed including Adherent Slough. Assessment Active Problems ICD-10 Abrasion, right lower leg, subsequent encounter Non-pressure chronic ulcer of right calf limited to breakdown of skin Procedures Wound #1 Pre-procedure diagnosis of Wound #1 is an Abrasion located on the Left,Anterior Lower Leg . There was a Excisional Skin/Subcutaneous Tissue Debridement HAYATO, LEVI ([id number]) [id number] Page 6 of 8 with a total area of 7 sq cm performed by [name] Dillon, MD. With the following  instrument(s): Curette to remove Viable and Non-Viable tissue/material. Material removed includes Subcutaneous Tissue and Slough and. A time out was conducted at 09:40, prior to the start of the procedure. A Minimum amount of bleeding was controlled with Pressure. The procedure was tolerated well with a pain level of 0 throughout and a pain level of 0 following the procedure. Post Debridement Measurements: 6.5cm length x 3.5cm width x 0.1cm depth; 1.787cm^3 volume. Character of Wound/Ulcer Post Debridement is improved. Post procedure Diagnosis Wound #1: Same as Pre-Procedure Plan Follow-up Appointments: Return Appointment in 1 week. Bathing/ Shower/ Hygiene: May shower; gently cleanse wound with antibacterial soap, rinse and pat dry prior to dressing wounds Edema Control - Lymphedema / Segmental Compressive Device / Other: Elevate, Exercise Daily and Avoid Standing for Long Periods of Time. Elevate legs to the  level of the heart and pump ankles as often as possible Elevate leg(s) parallel to the floor when sitting. WOUND #1: - Lower Leg Wound Laterality: Left, Anterior Cleanser: Byram Ancillary Kit - 15 Day Supply (DME) (Generic) Every Other Day/30 Days Discharge Instructions: Use supplies as instructed; Kit contains: (15) Saline Bullets; (15) 3x3 Gauze; 15 pr Gloves Cleanser: Soap and Water Every Other Day/30 Days Discharge Instructions: Gently cleanse wound with antibacterial soap, rinse and pat dry prior to dressing wounds Prim Dressing: Hydrofera Blue Ready Transfer Foam, 4x5 (in/in) Every Other Day/30 Days ary Discharge Instructions: Apply Hydrofera Blue Ready to wound bed as directed Secondary Dressing: (BORDER) Zetuvit Plus SILICONE BORDER Dressing 5x5 (in/in) (DME) (Generic) Every Other Day/30 Days Discharge Instructions: Please do not put silicone bordered dressings under wraps. Use non-bordered dressing only. 1. Abrasion injury on the right anterior lower leg 2. Superficial  debridement around the wound surface as noted 3. We are going to apply [facility] [employer] and border foam they are going to change this every second day 4. They are to continue their antibiotics as originally prescribed by primary care 4 days ago 5. We will see them again next week Electronic Signature(s) Signed: 02/19/2023 4:03:34 PM By: Linton Ham MD Entered By: Linton Ham on 02/19/2023 09:59:34 -------------------------------------------------------------------------------- ROS/PFSH Details Patient Name: Date of Service: [name] Mons CK W. 02/19/2023 8:45 A M Medical Record Number: [id number] Patient Account Number: [id number] Date of Birth/Sex: Treating RN: [name] [name] (76 y.o. [name] [name] Primary Care Provider: Miguel [name] Other Clinician: Referring Provider: Treating Provider/Extender: RO BSO N, MICHA EL [place] Bailiff in Treatment: 0 Eyes Complaints and Symptoms: Positive for: Vision Changes Cardiovascular Medical History: Positive for: Hypertension; Myocardial Infarction; Peripheral Venous Disease Integumentary (Skin) VIOLET, BOIVIN ([id number]) [id number] Page 7 of 8 Oncologic Complaints and Symptoms: Review of System Notes: prostat Medical History: Positive for: Received Radiation - 2020 Immunizations Pneumococcal Vaccine: Received Pneumococcal Vaccination: No Implantable Devices None Family and Social History Never smoker; Marital Status - Married; Alcohol Use: Daily; Drug Use: No History; Caffeine Use: Moderate Electronic Signature(s) Signed: 02/19/2023 4:03:34 PM By: Linton Ham MD Signed: 02/20/2023 3:12:30 PM By: [name] RN Entered By: [name] on 02/19/2023 09:[name]:02 -------------------------------------------------------------------------------- SuperBill Details Patient Name: Date of Service: [name] Mons CK W. 02/19/2023 Medical Record Number: [id number] Patient Account Number: [id number] Date of Birth/Sex:  Treating RN: [birth year][name] [name] (76 y.o. [name] [name]) [name] Primary Care Provider: Miguel [name] Other Clinician: Referring Provider: Treating Provider/Extender: RO BSO N, MICHA EL [place] Bailiff in Treatment: 0 Diagnosis Coding ICD-10 Codes Code Description [phone] Abrasion, right lower leg, subsequent encounter L97.211 Non-pressure chronic ulcer of right calf limited to breakdown of skin Facility Procedures : [place] Code: [id number] Description: [place] VISIT-LEV 3 EST PT Modifier: Quantity: 1 : CPT4 Code: [id number] Description: [place] - DEB SUBQ TISSUE [name] SQ CM/< ICD-10 Diagnosis Description L97.211 Non-pressure chronic ulcer of right calf limited to breakdown of skin Modifier: Quantity: 1 Physician Procedures : CPT4 Code Description Modifier [id number] WC PHYS LEVEL 3 NEW PT [name] ICD-10 Diagnosis Description S80.811D Abrasion, right lower leg, subsequent encounter L97.211 Non-pressure chronic ulcer of right calf limited to breakdown of skin Quantity: 1 : [id number] - WC PHYS SUBQ TISS [name] SQ CM ICD-10 Diagnosis Description JONG, NELLS ([id number]) [id number] ICD-10 Diagnosis Description L97.211 Non-pressure chronic ulcer of right calf limited to breakdown of skin Quantity: 1 [id number].pdf Page 8 of 8 Electronic Signature(s) Signed: 02/19/2023 4:03:34 PM By: Linton Ham MD Entered  By: Linton Ham on 02/19/2023 10:00:02

## 2023-02-26 ENCOUNTER — Telehealth: Payer: Self-pay

## 2023-02-26 ENCOUNTER — Encounter (HOSPITAL_BASED_OUTPATIENT_CLINIC_OR_DEPARTMENT_OTHER): Payer: Medicare HMO | Admitting: Internal Medicine

## 2023-02-26 DIAGNOSIS — G20A1 Parkinson's disease without dyskinesia, without mention of fluctuations: Secondary | ICD-10-CM | POA: Diagnosis not present

## 2023-02-26 DIAGNOSIS — S80811D Abrasion, right lower leg, subsequent encounter: Secondary | ICD-10-CM | POA: Diagnosis not present

## 2023-02-26 DIAGNOSIS — L97911 Non-pressure chronic ulcer of unspecified part of right lower leg limited to breakdown of skin: Secondary | ICD-10-CM | POA: Diagnosis not present

## 2023-02-26 DIAGNOSIS — L97211 Non-pressure chronic ulcer of right calf limited to breakdown of skin: Secondary | ICD-10-CM | POA: Diagnosis not present

## 2023-02-26 DIAGNOSIS — Z8546 Personal history of malignant neoplasm of prostate: Secondary | ICD-10-CM | POA: Diagnosis not present

## 2023-02-26 DIAGNOSIS — I10 Essential (primary) hypertension: Secondary | ICD-10-CM | POA: Diagnosis not present

## 2023-02-26 DIAGNOSIS — I252 Old myocardial infarction: Secondary | ICD-10-CM | POA: Diagnosis not present

## 2023-02-26 DIAGNOSIS — Z923 Personal history of irradiation: Secondary | ICD-10-CM | POA: Diagnosis not present

## 2023-02-26 DIAGNOSIS — I6529 Occlusion and stenosis of unspecified carotid artery: Secondary | ICD-10-CM | POA: Diagnosis not present

## 2023-02-26 DIAGNOSIS — L97811 Non-pressure chronic ulcer of other part of right lower leg limited to breakdown of skin: Secondary | ICD-10-CM | POA: Diagnosis not present

## 2023-02-26 NOTE — Telephone Encounter (Signed)
Ok, noted, pt is welcome to make an appt if desires

## 2023-02-26 NOTE — Telephone Encounter (Signed)
Call came from pt requesting labs results per agent. Shared results available and told pt that there were still labs pending.  PT stated he wanted to talk to Dr. Rosanna Randy. I told him he is no longer with [facility].  Pt mentioned that BP this morning was 80/50. He stated he feels fine.I told him that I wanted to ask him more questions about that.   PT wanted Dr. Rosanna Randy as Dr. Rosanna Randy is his doctor. And did not want to speak to me.   Gave pt Dr. Alben Spittle office number

## 2023-02-27 DIAGNOSIS — I10 Essential (primary) hypertension: Secondary | ICD-10-CM | POA: Diagnosis not present

## 2023-02-27 DIAGNOSIS — I2510 Atherosclerotic heart disease of native coronary artery without angina pectoris: Secondary | ICD-10-CM | POA: Diagnosis not present

## 2023-02-27 DIAGNOSIS — I6529 Occlusion and stenosis of unspecified carotid artery: Secondary | ICD-10-CM | POA: Diagnosis not present

## 2023-02-27 DIAGNOSIS — G20B1 Parkinson's disease with dyskinesia, without mention of fluctuations: Secondary | ICD-10-CM | POA: Diagnosis not present

## 2023-02-27 DIAGNOSIS — M79671 Pain in right foot: Secondary | ICD-10-CM | POA: Diagnosis not present

## 2023-02-27 DIAGNOSIS — M79672 Pain in left foot: Secondary | ICD-10-CM | POA: Diagnosis not present

## 2023-02-27 DIAGNOSIS — I959 Hypotension, unspecified: Secondary | ICD-10-CM | POA: Diagnosis not present

## 2023-02-27 NOTE — Progress Notes (Signed)
FERDINANDO, TRAINO ([id number]) [id number] Page 1 of 7 Visit Report for 02/26/2023 Chief Complaint Document Details Patient Name: Date of Service: LILTON, NOLASCO. 02/26/2023 8:45 A M Medical Record Number: [id number] Patient Account Number: [id number] Date of Birth/Sex: Treating RN: [birth year] (77 y.o. Verl Blalock Primary Care Provider: Miguel Aschoff Other Clinician: Massie Kluver Referring Provider: Treating Provider/Extender: Osvaldo Human in Treatment: 1 Information Obtained from: Patient Chief Complaint 02/20/2023. Patient is here for review of an abrasion injury on the right anterior mid tibia Electronic Signature(s) Signed: 02/26/2023 1:27:41 PM By: Kalman Shan DO Entered By: Kalman Shan on 02/26/2023 09:36:54 -------------------------------------------------------------------------------- Debridement Details Patient Name: Date of Service: [name] 02/26/2023 8:45 A M Medical Record Number: [id number] Patient Account Number: [id number] Date of Birth/Sex: Treating RN: 1946/11/14 (77 y.o. Isac Sarna, Maudie Mercury Primary Care Provider: Miguel Aschoff Other Clinician: Massie Kluver Referring Provider: Treating Provider/Extender: Osvaldo Human in Treatment: 1 Debridement Performed for Assessment: Wound #1 Dorsal,Anterior Lower Leg Performed By: Physician Kalman Shan, MD Debridement Type: Debridement Level of Consciousness (Pre-procedure): Awake and Alert Pre-procedure Verification/Time Out Yes - 09:17 Taken: T Area Debrided (L x W): otal 3 (cm) x 1 (cm) = 3 (cm) Tissue and other material debrided: Viable, Non-Viable, Slough, Slough Level: Non-Viable Tissue Debridement Description: Selective/Open Wound Instrument: Curette Bleeding: Minimum Hemostasis Achieved: Pressure Response to Treatment: Procedure was tolerated well Level of Consciousness (Post- Awake and Alert procedure): Post  Debridement Measurements of Total Wound AMAUD, MACALLISTER ([id number]) [id number] Page 2 of 7 Length: (cm) 5.8 Width: (cm) 2.2 Depth: (cm) 0.1 Volume: (cm) 1.002 Character of Wound/Ulcer Post Debridement: Stable Post Procedure Diagnosis Same as Pre-procedure Electronic Signature(s) Signed: 02/26/2023 9:45:50 AM By: Massie Kluver Signed: 02/26/2023 1:27:41 PM By: Kalman Shan DO Signed: 02/27/2023 7:50:42 AM By: Gretta Cool, BSN, RN, CWS, Kim RN, BSN Entered By: Massie Kluver on 02/26/2023 09:18:50 -------------------------------------------------------------------------------- HPI Details Patient Name: Date of Service: [name] 02/26/2023 8:45 A M Medical Record Number: [id number] Patient Account Number: [id number] Date of Birth/Sex: Treating RN: 1946-09-15 (77 y.o. Verl Blalock Primary Care Provider: Miguel Aschoff Other Clinician: Massie Kluver Referring Provider: Treating Provider/Extender: Osvaldo Human in Treatment: 1 History of Present Illness HPI Description: Admission 02/17/2022 This is a patient who fell in his room about a week and a half ago. He saw his primary doctor was felt to have cellulitis of the right leg. Noticed to have a wound of the right leg. He has been applying Neosporin since 3/1. Also was placed on Bactrim DS 4 days ago. He is not in a lot of pain. No prior wound history. He has a history of PAD, Parkinson's, B12 deficiency, prostate cancer, nonrheumatic mitral valve disease, MI 1994, B12 deficiency, history of Mohs surgery followed by dermatology and carotid artery stenosis ABI on the right leg was 0.61. 3/12; patient presents for follow-up. He has been using Hydrofera Blue to the wound bed. There is been improvement in healing. Patient has no issues or complaints today. Electronic Signature(s) Signed: 02/26/2023 1:27:41 PM By: Kalman Shan DO Entered By: Kalman Shan on 02/26/2023  09:37:21 -------------------------------------------------------------------------------- Physical Exam Details Patient Name: Date of Service: [name] 02/26/2023 8:45 A Isaias Sakai ([id number]) [id number] Page 3 of 7 Medical Record Number: [id number] Patient Account Number: [id number] Date of Birth/Sex: Treating RN: [birth year] (77 y.o. Verl Blalock Primary Care Provider: Miguel Aschoff Other Clinician: Massie Kluver Referring Provider: Treating Provider/Extender: Kalman Shan  Miguel Aschoff Weeks in Treatment: 1 Constitutional . Cardiovascular . Psychiatric . Notes Right lower extremity: T the anterior mid tibia there is an open wound with granulation tissue, epithelization and nonviable tissue. No evidence of surrounding o soft tissue infection. Electronic Signature(s) Signed: 02/26/2023 1:27:41 PM By: Kalman Shan DO Entered By: Kalman Shan on 02/26/2023 09:38:09 -------------------------------------------------------------------------------- Physician Orders Details Patient Name: Date of Service: [name] 02/26/2023 8:45 A M Medical Record Number: [id number] Patient Account Number: [id number] Date of Birth/Sex: Treating RN: 1946/07/03 (77 y.o. Verl Blalock Primary Care Provider: Miguel Aschoff Other Clinician: Massie Kluver Referring Provider: Treating Provider/Extender: Osvaldo Human in Treatment: 1 Verbal / Phone Orders: No Diagnosis Coding Follow-up Appointments Return Appointment in 1 week. Bathing/ Shower/ Hygiene May shower; gently cleanse wound with antibacterial soap, rinse and pat dry prior to dressing wounds Edema Control - Lymphedema / Segmental Compressive Device / Other Elevate, Exercise Daily and A void Standing for Long Periods of Time. Elevate legs to the level of the heart and pump ankles as often as possible Elevate leg(s) parallel to the floor when  sitting. Wound Treatment Wound #1 - Lower Leg Wound Laterality: Dorsal, Anterior Cleanser: Byram Ancillary Kit - 15 Day Supply (Generic) Every Other Day/30 Days Discharge Instructions: Use supplies as instructed; Kit contains: (15) Saline Bullets; (15) 3x3 Gauze; 15 pr Gloves Cleanser: Soap and Water Every Other Day/30 Days Discharge Instructions: Gently cleanse wound with antibacterial soap, rinse and pat dry prior to dressing wounds Prim Dressing: Hydrofera Blue Ready Transfer Foam, 4x5 (in/in) Every Other Day/30 Days ary Discharge Instructions: Apply Hydrofera Blue Ready to wound bed as directed Secondary Dressing: (BORDER) Zetuvit Plus SILICONE BORDER Dressing 5x5 (in/in) (Generic) Every Other Day/30 Days Discharge Instructions: Please do not put silicone bordered dressings under wraps. Use non-bordered dressing only. MORELAND, DEGENOVA ([id number]) [id number] Page 4 of 7 Electronic Signature(s) Signed: 02/26/2023 1:27:41 PM By: Kalman Shan DO Entered By: Kalman Shan on 02/26/2023 09:40:38 -------------------------------------------------------------------------------- Problem List Details Patient Name: Date of Service: [name] 02/26/2023 8:45 A M Medical Record Number: [id number] Patient Account Number: [id number] Date of Birth/Sex: Treating RN: 1946/09/10 (77 y.o. 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 02/26/2023 8:45 A M Medical Record Number: [id number] Patient Account Number: [id number] Date of Birth/Sex: Treating RN: 1946-01-19 (77 y.o. Isac Sarna, Maudie Mercury Primary Care Provider: Miguel Aschoff Other Clinician: Massie Kluver Referring Provider: Treating Provider/Extender: Osvaldo Human in Treatment: [address] GLORIA, DELAMATER ([id number]) [id number] Page 5 of 7 Chief Complaint Information obtained from Patient 02/20/2023. Patient is here for review of an abrasion injury on the right anterior mid tibia History of Present Illness (HPI) Admission 02/17/2022 This is a patient who fell in his room about a week and a half ago. He saw his primary doctor was felt to have cellulitis of the right leg. Noticed to have a wound of the right leg. He has been applying Neosporin since 3/1.  Also was placed on Bactrim DS 4 days ago. He is not in a lot of pain. No prior wound history. He has a history of PAD, Parkinson's, B12 deficiency, prostate cancer, nonrheumatic mitral valve disease, MI 1994, B12 deficiency, history of Mohs surgery followed by dermatology and carotid artery stenosis ABI on the right leg was 0.61. 3/12; patient presents for follow-up. He has been using Hydrofera Blue to the wound bed. There is been improvement in healing. Patient has no issues or complaints today. Objective Constitutional Vitals Time Taken: 8:57 AM, Temperature: 97.6 F, Pulse: 67 bpm, Respiratory Rate: 16 breaths/min, Blood Pressure: 77/50 mmHg. General Notes: Patient states he feels fine General Notes: Right lower extremity: T the anterior mid tibia there is an open wound with granulation tissue, epithelization and nonviable tissue. No evidence o of surrounding soft  tissue infection. Integumentary (Hair, Skin) Wound #1 status is Open. Original cause of wound was Trauma. The date acquired was: 02/11/2023. The wound has been in treatment 1 weeks. The wound is located on the Dorsal,Anterior Lower Leg. The wound measures 5.8cm length x 2.2cm width x 0.1cm depth; 10.022cm^2 area and 1.002cm^3 volume. There is Fat Layer (Subcutaneous Tissue) exposed. There is a medium amount of serosanguineous drainage noted. There is medium (34-66%) pink granulation within the wound bed. There is a medium (34-66%) amount of necrotic tissue within the wound bed including Adherent Slough. 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 I debrided nonviable tissue. I recommended continuing the course with Hydrofera Blue. Follow-up in 1 week. Procedures Wound #1 Pre-procedure diagnosis of Wound #1 is an Abrasion located on the Dorsal,Anterior Lower Leg . There was a Selective/Open Wound Non-Viable Tissue Debridement with a total area of 3 sq cm performed by Kalman Shan, MD. With the following instrument(s): Curette to remove Viable and Non-Viable tissue/material. Material removed includes [place]. A time out was conducted at 09:17, prior to the start of the procedure. A Minimum amount of bleeding was controlled with Pressure. The procedure was tolerated well. Post Debridement Measurements: 5.8cm length x 2.2cm width x 0.1cm depth; 1.002cm^3 volume. Character of Wound/Ulcer Post Debridement is stable. Post procedure Diagnosis Wound #1: Same as Pre-Procedure Plan Follow-up Appointments: Return Appointment in 1 week. Bathing/ Shower/ Hygiene: May shower; gently cleanse wound with antibacterial soap, rinse and pat dry prior to dressing wounds MOHIT, CLAYCAMP ([id number])  [id number] Page 6 of 7 Edema Control - Lymphedema / Segmental Compressive Device / Other: Elevate, Exercise Daily and Avoid Standing for Long Periods of Time. Elevate legs to the level of the heart and pump ankles as often as possible Elevate leg(s) parallel to the floor when sitting. WOUND #1: - Lower Leg Wound Laterality: Dorsal, Anterior Cleanser: Byram Ancillary Kit - 15 Day Supply (Generic) Every Other Day/30 Days Discharge Instructions: Use supplies as instructed; Kit contains: (15) Saline Bullets; (15) 3x3 Gauze; 15 pr Gloves Cleanser: Soap and Water Every Other Day/30 Days Discharge Instructions: Gently cleanse wound with antibacterial soap, rinse and pat dry prior to dressing wounds Prim Dressing: Hydrofera Blue Ready Transfer Foam, 4x5 (in/in) Every Other Day/30 Days ary Discharge Instructions: Apply Hydrofera Blue Ready to wound bed as directed Secondary Dressing: (BORDER) Zetuvit Plus SILICONE BORDER Dressing 5x5 (in/in) (Generic) Every Other Day/30 Days Discharge Instructions: Please do not put silicone bordered dressings under wraps. Use non-bordered  dressing only. 1. In office sharp debridement 2. Hydrofera Blue 3. Follow-up in 1 week Electronic Signature(s) Signed: 02/26/2023 1:27:41 PM By: Kalman Shan DO Entered By: Kalman Shan on 02/26/2023 09:40:23 -------------------------------------------------------------------------------- SuperBill Details Patient Name: Date of Service: [name] 02/26/2023 Medical Record Number: [id number] Patient Account Number: [id number] Date of Birth/Sex: Treating RN: October 23, 1946 (77 y.o. 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 limited to breakdown of skin Facility Procedures : CPT4 Code: [id number] Description: [phone] - DEBRIDE WOUND 1ST 20 SQ CM OR < ICD-10 Diagnosis Description L97.911 Non-pressure chronic ulcer of unspecified part of right lower leg limited to brea S80.811D Abrasion, right lower leg, subsequent encounter Modifier: kdown of skin Quantity: 1 Physician Procedures : CPT4 Code Description Modifier [id number] - WC PHYS DEBR WO ANESTH 20 SQ CM ICD-10 Diagnosis Description [id number] 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

## 2023-02-27 NOTE — Progress Notes (Signed)
[name] [name], [name] [name] ([id number]) [id number] Page 1 of 9 Visit Report for 02/26/2023 Arrival Information Details Patient Name: Date of Service: [name] [name]. 02/26/2023 8:45 A M Medical Record Number: [id number] Patient Account Number: [id number] Date of Birth/Sex: Treating RN: Sep 01, 1946 (77 y.o. Isac Sarna, Maudie Mercury Primary Care Alinna Siple: Miguel Aschoff Other Clinician: Massie Kluver Referring Gretna Bergin: Treating Teiara Baria/Extender: Osvaldo Human in Treatment: 1 Visit Information History Since Last Visit All ordered tests and consults were completed: No Patient Arrived: Ambulatory Added or deleted any medications: No Arrival Time: 08:55 Any new allergies or adverse reactions: No Transfer Assistance: None Had a fall or experienced change in No Patient Identification Verified: Yes activities of daily living that may affect Secondary Verification Process Completed: Yes risk of falls: Patient Requires Transmission-Based Precautions: No Signs or symptoms of abuse/neglect since last visito No Patient Has Alerts: No Hospitalized since last visit: No Implantable device outside of the clinic excluding No cellular tissue based products placed in the center since last visit: Has Dressing in Place as Prescribed: Yes Pain Present Now: Yes Electronic Signature(s) Signed: 02/26/2023 9:45:50 AM By: Massie Kluver Entered By: Massie Kluver on 02/26/2023 08:56:24 -------------------------------------------------------------------------------- Clinic Level of Care Assessment Details Patient Name: Date of Service: [name] [name]. 02/26/2023 8:45 A M Medical Record Number: [id number] Patient Account Number: [id number] Date of Birth/Sex: Treating RN: Apr 13, 1946 (77 y.o. Isac Sarna, Maudie Mercury Primary Care Kalayla Shadden: Miguel Aschoff Other Clinician: Massie Kluver Referring Macon Lesesne: Treating Draden Cottingham/Extender: Osvaldo Human in  Treatment: 1 Clinic Level of Care Assessment Items TOOL 1 Quantity Score '[]'$  - 0 Use when EandM and Procedure is performed on INITIAL visit ASSESSMENTS - Nursing Assessment / Reassessment '[]'$  - 0 General Physical Exam (combine w/ comprehensive assessment (listed just below) when performed on new pt. evals) '[]'$  - 0 Comprehensive Assessment (HX, ROS, Risk Assessments, Wounds Hx, etc.) [name] [name], [name] [name] ([id number]) [id number] Page 2 of 9 ASSESSMENTS - Wound and Skin Assessment / Reassessment '[]'$  - 0 Dermatologic / Skin Assessment (not related to wound area) ASSESSMENTS - Ostomy and/or Continence Assessment and Care '[]'$  - 0 Incontinence Assessment and Management '[]'$  - 0 Ostomy Care Assessment and Management (repouching, etc.) PROCESS - Coordination of Care '[]'$  - 0 Simple Patient / Family Education for ongoing care '[]'$  - 0 Complex (extensive) Patient / Family Education for ongoing care '[]'$  - 0 Staff obtains Programmer, systems, Records, T Results / Process Orders est '[]'$  - 0 Staff telephones HHA, Nursing Homes / Clarify orders / etc '[]'$  - 0 Routine Transfer to another Facility (non-emergent condition) '[]'$  - 0 Routine Hospital Admission (non-emergent condition) '[]'$  - 0 New Admissions / Biomedical engineer / Ordering NPWT Apligraf, etc. , '[]'$  - 0 Emergency Hospital Admission (emergent condition) PROCESS - Special Needs '[]'$  - 0 Pediatric / Minor Patient Management '[]'$  - 0 Isolation Patient Management '[]'$  - 0 Hearing / Language / Visual special needs '[]'$  - 0 Assessment of Community assistance (transportation, D/C planning, etc.) '[]'$  - 0 Additional assistance / Altered mentation '[]'$  - 0 Support Surface(s) Assessment (bed, cushion, seat, etc.) INTERVENTIONS - Miscellaneous '[]'$  - 0 External ear exam '[]'$  - 0 Patient Transfer (multiple staff / Civil Service fast streamer / Similar devices) '[]'$  - 0 Simple Staple / Suture removal (25 or less) '[]'$  - 0 Complex Staple / Suture removal (26 or  more) '[]'$  - 0 Hypo/Hyperglycemic Management (do not check if billed separately) '[]'$  - 0 Ankle / Brachial Index (ABI) - do not check if billed separately Has the  patient been seen at the hospital within the last three years: Yes Total Score: 0 Level Of Care: ____ Electronic Signature(s) Signed: 02/26/2023 9:45:50 AM By: Massie Kluver Entered By: Massie Kluver on 02/26/2023 09:19:11 -------------------------------------------------------------------------------- Encounter Discharge Information Details Patient Name: Date of Service: [name] [name] CK W. 02/26/2023 8:45 A M Medical Record Number: [id number] Patient Account Number: [id number] Date of Birth/Sex: Treating RN: Oct 22, 1946 (77 y.o. Verl Blalock Primary Care Armella Stogner: Miguel Aschoff Other Clinician: Massie Kluver Referring Gregg Winchell: Treating Kawan Valladolid/Extender: [name] [name] ([id number]) [id number] Page 3 of 9 Weeks in Treatment: 1 Encounter Discharge Information Items Post Procedure Vitals Discharge Condition: Stable Temperature (F): 97.6 Ambulatory Status: Ambulatory Pulse (bpm): 67 Discharge Destination: Home Respiratory Rate (breaths/min): 16 Transportation: Private Auto Blood Pressure (mmHg): 77/50 Accompanied By: wife Schedule Follow-up Appointment: Yes Clinical Summary of Care: Electronic Signature(s) Signed: 02/26/2023 9:45:50 AM By: Massie Kluver Entered By: Massie Kluver on 02/26/2023 09:20:30 -------------------------------------------------------------------------------- Lower Extremity Assessment Details Patient Name: Date of Service: [name] [name], [name] CK W. 02/26/2023 8:45 A M Medical Record Number: [id number] Patient Account Number: [id number] Date of Birth/Sex: Treating RN: 16-Dec-1946 (77 y.o. Isac Sarna, Maudie Mercury Primary Care Andreina Outten: Miguel Aschoff Other Clinician: Massie Kluver Referring Rhyse Skowron: Treating Mabeline Varas/Extender: Osvaldo Human in Treatment: 1 Edema Assessment Assessed: [Left: No] [Right: Yes] Edema: [Left: N] [Right: o] Calf Left: Right: Point of Measurement: 33 cm From Medial Instep 34 cm Ankle Left: Right: Point of Measurement: 10 cm From Medial Instep 23.5 cm Vascular Assessment Pulses: Dorsalis Pedis Palpable: [Right:Yes] Electronic Signature(s) Signed: 02/26/2023 9:45:50 AM By: Massie Kluver Signed: 02/27/2023 7:50:42 AM By: Gretta Cool, BSN, RN, CWS, Kim RN, BSN Entered By: Massie Kluver on 02/26/2023 09:11:04 [name] ([id number] Page 4 of 9 -------------------------------------------------------------------------------- Multi Wound Chart Details Patient Name: Date of Service: [name] [name], BORTNER. 02/26/2023 8:45 A M Medical Record Number: [id number] Patient Account Number: [id number] Date of Birth/Sex: Treating RN: 1946/10/08 (77 y.o. Isac Sarna, Maudie Mercury Primary Care Fynley Chrystal: Miguel Aschoff Other Clinician: Massie Kluver Referring Lavinia Mcneely: Treating Bravery Ketcham/Extender: Osvaldo Human in Treatment: 1 Vital Signs Height(in): Pulse(bpm): 50 Weight(lbs): Blood Pressure(mmHg): 77/50 Body Mass Index(BMI): Temperature(F): 97.6 Respiratory Rate(breaths/min): 16 [1:Photos:] [N/A:N/A] Dorsal, Anterior Lower Leg N/A N/A Wound Location: Trauma N/A N/A Wounding Event: Abrasion N/A N/A Primary Etiology: Hypertension, Myocardial Infarction, N/A N/A Comorbid History: Peripheral Venous Disease, Received Radiation 02/11/2023 N/A N/A Date Acquired: 1 N/A N/A Weeks of Treatment: Open N/A N/A Wound Status: No N/A N/A Wound Recurrence: 5.8x2.2x0.1 N/A N/A Measurements L x W x D (cm) 10.022 N/A N/A A (cm) : rea 1.002 N/A N/A Volume (cm) : 43.90% N/A N/A % Reduction in Area: 43.90% N/A N/A % Reduction in Volume: Full Thickness Without Exposed N/A N/A Classification: Support Structures Medium N/A N/A Exudate  Amount: Serosanguineous N/A N/A Exudate Type: red, brown N/A N/A Exudate Color: Medium (34-66%) N/A N/A Granulation Amount: Pink N/A N/A Granulation Quality: Medium (34-66%) N/A N/A Necrotic Amount: Fat Layer (Subcutaneous Tissue): Yes N/A N/A Exposed Structures: Fascia: No Tendon: No Muscle: No Joint: No Bone: No Treatment Notes Electronic Signature(s) Signed: 02/26/2023 9:45:50 AM By: Massie Kluver Entered By: Massie Kluver on 02/26/2023 09:11:15 [name] ([id number] Page 5 of 9 -------------------------------------------------------------------------------- Multi-Disciplinary Care Plan Details Patient Name: Date of Service: [name] [name], DUFFEK. 02/26/2023 8:45 A M Medical Record Number: [id number] Patient Account Number: [id number] Date of Birth/Sex: Treating RN: Apr 24, 1946 (76 y.o. Verl Blalock Primary Care Lin Glazier: Miguel Aschoff Other  Clinician: Massie Kluver Referring Marilea Gwynne: Treating Barbarajean Kinzler/Extender: Osvaldo Human in Treatment: 1 Active Inactive Abuse / Safety / Falls / Self Care Management Nursing Diagnoses: Potential for injury related to falls Goals: Patient will not experience any injury related to falls Date Initiated: 02/19/2023 Target Resolution Date: 03/22/2023 Goal Status: Active Interventions: Assess Activities of Daily Living upon admission and as needed Assess fall risk on admission and as needed Assess: immobility, friction, shearing, incontinence upon admission and as needed Assess impairment of mobility on admission and as needed per policy Assess personal safety and home safety (as indicated) on admission and as needed Assess self care needs on admission and as needed Notes: Electronic Signature(s) Signed: 02/26/2023 9:45:50 AM By: Massie Kluver Signed: 02/27/2023 7:50:42 AM By: Gretta Cool, BSN, RN, CWS, Kim RN, BSN Entered By: Massie Kluver on 02/26/2023  09:19:18 -------------------------------------------------------------------------------- Pain Assessment Details Patient Name: Date of Service: [name] [name] CK W. 02/26/2023 8:45 A M Medical Record Number: [id number] Patient Account Number: [id number] Date of Birth/Sex: Treating RN: [birth year] (77 y.o. Isac Sarna, Maudie Mercury Primary Care Mulki Roesler: Miguel Aschoff Other Clinician: Massie Kluver Referring Jonan Seufert: Treating Betti Goodenow/Extender: Osvaldo Human in Treatment: [address] [name] [name], [name] [name] ([id number]) [id number] Page 6 of 9 Location of Pain Severity and Description of Pain Patient Has Paino Yes Site Locations Pain Location: Pain in Ulcers Duration of the Pain. Constant / Intermittento Intermittent Rate the pain. Current Pain Level: 3 Character of Pain Describe the Pain: Aching Pain Management and Medication Current Pain Management: Medication: No Cold Application: No Rest: Yes Massage: No Activity: No T.E.N.S.: No Heat Application: No Leg drop or elevation: No Is the Current Pain Management Adequate: Inadequate How does your wound impact your activities of daily livingo Sleep: No Bathing: No Appetite: No Relationship With Others: No Bladder Continence: No Emotions: No Bowel Continence: No Work: No Toileting: No Drive: No Dressing: No Hobbies: No Electronic Signature(s) Signed: 02/26/2023 9:45:50 AM By: Massie Kluver Signed: 02/27/2023 7:50:42 AM By: Gretta Cool, BSN, RN, CWS, Kim RN, BSN Entered By: Massie Kluver on 02/26/2023 09:01:41 -------------------------------------------------------------------------------- Patient/Caregiver Education Details Patient Name: Date of Service: [name] [name] 3/12/2024andnbsp8:45 A M Medical Record Number: [id number] Patient Account Number: [id number] Date of Birth/Gender: Treating RN: October 03, 1946 (77 y.o. Verl Blalock Primary Care Physician: Miguel Aschoff Other  Clinician: Massie Kluver Referring Physician: Treating Physician/Extender: Osvaldo Human in Treatment: 1 Education Assessment Education Provided To: Patient [name] [name], [name] [name] ([id number]) [id number] Page 7 of 9 Education Topics Provided Wound/Skin Impairment: Handouts: Other: continue wound care as directed Methods: Explain/Verbal Responses: State content correctly Electronic Signature(s) Signed: 02/26/2023 9:45:50 AM By: Massie Kluver Entered By: Massie Kluver on 02/26/2023 09:19:34 -------------------------------------------------------------------------------- Wound Assessment Details Patient Name: Date of Service: [name] [name] CK W. 02/26/2023 8:45 A M Medical Record Number: [id number] Patient Account Number: [id number] Date of Birth/Sex: Treating RN: 02/19/1946 (77 y.o. Isac Sarna, Maudie Mercury Primary Care Kaiyden Simkin: Miguel Aschoff Other Clinician: Massie Kluver Referring Kindred Reidinger: Treating Dquan Cortopassi/Extender: Osvaldo Human in Treatment: 1 Wound Status Wound Number: 1 Primary Abrasion Etiology: Wound Location: Dorsal, Anterior Lower Leg Wound Open Wounding Event: Trauma Status: Date Acquired: 02/11/2023 Comorbid Hypertension, Myocardial Infarction, Peripheral Venous Weeks Of Treatment: 1 History: Disease, Received Radiation Clustered Wound: No Photos Wound Measurements Length: (cm) 5.8 Width: (cm) 2.2 Depth: (cm) 0.1 Area: (cm) 10.022 Volume: (cm) 1.002 % Reduction in Area: 43.9% % Reduction in Volume: 43.9% Wound Description Classification: Full Thickness Without Exposed Suppor Exudate Amount: Medium Exudate Type:  Serosanguineous Exudate Color: red, brown t Structures Foul Odor After Cleansing: No Slough/Fibrino Yes Wound Bed Granulation Amount: Medium (34-66%) Exposed Structure Granulation Quality: Pink Fascia Exposed: No Necrotic Amount: Medium (34-66%) Fat Layer (Subcutaneous Tissue)  ExposedMAXXWELL, [name] [name] ([id number]) [id number] Page 8 of 9 Necrotic Quality: Adherent Slough Tendon Exposed: No Muscle Exposed: No Joint Exposed: No Bone Exposed: No Treatment Notes Wound #1 (Lower Leg) Wound Laterality: Dorsal, Anterior Cleanser Byram Ancillary Kit - 15 Day Supply Discharge Instruction: Use supplies as instructed; Kit contains: (15) Saline Bullets; (15) 3x3 Gauze; 15 pr Gloves Soap and Water Discharge Instruction: Gently cleanse wound with antibacterial soap, rinse and pat dry prior to dressing wounds Peri-Wound Care Topical Primary Dressing Hydrofera Blue Ready Transfer Foam, 4x5 (in/in) Discharge Instruction: Apply Hydrofera Blue Ready to wound bed as directed Secondary Dressing (BORDER) Zetuvit Plus SILICONE BORDER Dressing 5x5 (in/in) Discharge Instruction: Please do not put silicone bordered dressings under wraps. Use non-bordered dressing only. Secured With Compression Wrap Compression Stockings Environmental education officer) Signed: 02/26/2023 9:45:50 AM By: Massie Kluver Signed: 02/27/2023 7:50:42 AM By: Gretta Cool, BSN, RN, CWS, Kim RN, BSN Entered By: Massie Kluver on 02/26/2023 09:08:11 -------------------------------------------------------------------------------- Vitals Details Patient Name: Date of Service: [name] [name] CK W. 02/26/2023 8:45 A M Medical Record Number: [id number] Patient Account Number: [id number] Date of Birth/Sex: Treating RN: 1946/09/28 (77 y.o. Verl Blalock Primary Care Michaelina Blandino: Miguel Aschoff Other Clinician: Massie Kluver Referring Rayshun Kandler: Treating Uzoma Vivona/Extender: Osvaldo Human in Treatment: 1 Vital Signs Time Taken: 08:57 Temperature (F): 97.6 Pulse (bpm): 67 Respiratory Rate (breaths/min): 16 Blood Pressure (mmHg): 77/50 Reference Range: 80 - 120 mg / dl Notes Patient states he feels fine Electronic Signature(s) Signed: 02/26/2023 9:45:50 AM By:  Nemiah Commander ([id number] Page 9 of 9 Signed: 02/26/2023 9:45:50 AM By: Massie Kluver Entered By: Massie Kluver on 02/26/2023 09:01:37

## 2023-03-06 ENCOUNTER — Encounter (HOSPITAL_BASED_OUTPATIENT_CLINIC_OR_DEPARTMENT_OTHER): Payer: Medicare HMO | Admitting: Internal Medicine

## 2023-03-06 DIAGNOSIS — Z923 Personal history of irradiation: Secondary | ICD-10-CM | POA: Diagnosis not present

## 2023-03-06 DIAGNOSIS — L97911 Non-pressure chronic ulcer of unspecified part of right lower leg limited to breakdown of skin: Secondary | ICD-10-CM | POA: Diagnosis not present

## 2023-03-06 DIAGNOSIS — S80811D Abrasion, right lower leg, subsequent encounter: Secondary | ICD-10-CM | POA: Diagnosis not present

## 2023-03-06 DIAGNOSIS — Z8546 Personal history of malignant neoplasm of prostate: Secondary | ICD-10-CM | POA: Diagnosis not present

## 2023-03-06 DIAGNOSIS — L97811 Non-pressure chronic ulcer of other part of right lower leg limited to breakdown of skin: Secondary | ICD-10-CM | POA: Diagnosis not present

## 2023-03-06 DIAGNOSIS — G20A1 Parkinson's disease without dyskinesia, without mention of fluctuations: Secondary | ICD-10-CM | POA: Diagnosis not present

## 2023-03-06 DIAGNOSIS — L97211 Non-pressure chronic ulcer of right calf limited to breakdown of skin: Secondary | ICD-10-CM | POA: Diagnosis not present

## 2023-03-06 DIAGNOSIS — I10 Essential (primary) hypertension: Secondary | ICD-10-CM | POA: Diagnosis not present

## 2023-03-06 DIAGNOSIS — I6529 Occlusion and stenosis of unspecified carotid artery: Secondary | ICD-10-CM | POA: Diagnosis not present

## 2023-03-06 DIAGNOSIS — I252 Old myocardial infarction: Secondary | ICD-10-CM | POA: Diagnosis not present

## 2023-03-07 NOTE — Progress Notes (Signed)
[name], [name] ([id number]) [id number] Page 1 of 8 Visit Report for 03/06/2023 Arrival Information Details Patient Name: Date of Service: [name] [name], [name] [name]. 03/06/2023 8:45 A M Medical Record Number: [id number] Patient Account Number: [id number] Date of Birth/Sex: Treating RN: 1946/08/23 (76 Jeffreyo. [name] [name]) [name] [name] Primary Care [name] [name]: [name] [name] Other Clinician: Referring Brennan Karam: Treating [name] [name]/Extender: [name] [name] in Treatment: 2 Visit Information History Since Last Visit Added or deleted any medications: No Patient Arrived: Ambulatory Any new allergies or adverse reactions: No Arrival Time: 09:01 Had a fall or experienced change in No Accompanied By: wife activities of daily living that may affect Transfer Assistance: None risk of falls: Patient Identification Verified: Yes Signs or symptoms of abuse/neglect since last visito No Secondary Verification Process Completed: Yes Hospitalized since last visit: No Patient Requires Transmission-Based Precautions: No Implantable device outside of the clinic excluding No Patient Has Alerts: No cellular tissue based products placed in the center since last visit: Has Dressing in Place as Prescribed: Yes Pain Present Now: No Electronic Signature(s) Signed: 03/06/2023 10:49:26 AM By: [name] Coria RN Entered By: [name] [name] on 03/06/2023 09:08:45 -------------------------------------------------------------------------------- Clinic Level of Care Assessment Details Patient Name: Date of Service: [name] [name], [name] [name] 03/06/2023 8:45 A M Medical Record Number: [id number] Patient Account Number: [id number] Date of Birth/Sex: Treating RN: May 15, 1946 (76 Jeffreyo. [name] [name]) [name] [name] Primary Care [name] [name]: [name] [name] Other Clinician: Referring Robbin Escher: Treating Zoua Caporaso/Extender: [name] [name] in Treatment: 2 Clinic Level of Care Assessment Items TOOL 1  Quantity Score []  - 0 Use when EandM and Procedure is performed on INITIAL visit ASSESSMENTS - Nursing Assessment / Reassessment []  - 0 General Physical Exam (combine w/ comprehensive assessment (listed just below) when performed on new pt. evals) []  - 0 Comprehensive Assessment (HX, ROS, Risk Assessments, Wounds Hx, etc.) [name], [name] ([id number]) [id number] Page 2 of 8 ASSESSMENTS - Wound and Skin Assessment / Reassessment []  - 0 Dermatologic / Skin Assessment (not related to wound area) ASSESSMENTS - Ostomy and/or Continence Assessment and Care []  - 0 Incontinence Assessment and Management []  - 0 Ostomy Care Assessment and Management (repouching, etc.) PROCESS - Coordination of Care []  - 0 Simple Patient / Family Education for ongoing care []  - 0 Complex (extensive) Patient / Family Education for ongoing care []  - 0 Staff obtains Programmer, systems, Records, T Results / Process Orders est []  - 0 Staff telephones HHA, Nursing Homes / Clarify orders / etc []  - 0 Routine Transfer to another Facility (non-emergent condition) []  - 0 Routine Hospital Admission (non-emergent condition) []  - 0 New Admissions / Biomedical engineer / Ordering NPWT Apligraf, etc. , []  - 0 Emergency Hospital Admission (emergent condition) PROCESS - Special Needs []  - 0 Pediatric / Minor Patient Management []  - 0 Isolation Patient Management []  - 0 Hearing / Language / Visual special needs []  - 0 Assessment of Community assistance (transportation, D/C planning, etc.) []  - 0 Additional assistance / Altered mentation []  - 0 Support Surface(s) Assessment (bed, cushion, seat, etc.) INTERVENTIONS - Miscellaneous []  - 0 External ear exam []  - 0 Patient Transfer (multiple staff / Civil Service fast streamer / Similar devices) []  - 0 Simple Staple / Suture removal (25 or less) []  - 0 Complex Staple / Suture removal (26 or more) []  - 0 Hypo/Hyperglycemic Management (do not check if billed  separately) []  - 0 Ankle / Brachial Index (ABI) - do not check if billed separately Has the patient been seen at the hospital within the  last three years: Yes Total Score: 0 Level Of Care: ____ Electronic Signature(s) Signed: 03/06/2023 10:49:26 AM By: [name] Coria RN Entered By: [name] [name] on 03/06/2023 09:25:53 -------------------------------------------------------------------------------- Encounter Discharge Information Details Patient Name: Date of Service: [name] Mons CK W. 03/06/2023 8:45 A M Medical Record Number: [id number] Patient Account Number: [id number] Date of Birth/Sex: Treating RN: 11-29-1946 (76 Jeffreyo. [name] [name] Primary Care Amogh Komatsu: [name] [name] Other Clinician: Referring Hugo Lybrand: Treating [name] [name]/Extender: [name] [name] in Treatment: 2 [name], [name] ([id number]) [id number] Page 3 of 8 Encounter Discharge Information Items Post Procedure Vitals Discharge Condition: Stable Temperature (F): 98.6 Ambulatory Status: Ambulatory Pulse (bpm): 56 Discharge Destination: Home Respiratory Rate (breaths/min): 18 Transportation: Private Auto Blood Pressure (mmHg): 165/84 Accompanied By: wife Schedule Follow-up Appointment: Yes Clinical Summary of Care: Electronic Signature(s) Signed: 03/06/2023 10:49:26 AM By: [name] Coria RN Entered By: [name] [name] on 03/06/2023 09:26:33 -------------------------------------------------------------------------------- Lower Extremity Assessment Details Patient Name: Date of Service: [name] [name], [name] CK W. 03/06/2023 8:45 A M Medical Record Number: [id number] Patient Account Number: [id number] Date of Birth/Sex: Treating RN: 09/19/1946 (76 Jeffreyo. [name] [name]) [name] [name] Primary Care Makenli [name]: [name] [name] Other Clinician: Referring [name] [name]: Treating Jamyrah [name]/Extender: [name] [name] in Treatment: 2 Edema Assessment Assessed: [Left: No] [Right: No] Edema:  [Left: Ye] [Right: s] Calf Left: Right: Point of Measurement: 33 cm From Medial Instep 34 cm Ankle Left: Right: Point of Measurement: 10 cm From Medial Instep 23 cm Vascular Assessment Pulses: Dorsalis Pedis Palpable: [Right:Yes] Electronic Signature(s) Signed: 03/06/2023 10:49:26 AM By: [name] Coria RN Entered By: [name] [name] on 03/06/2023 09:17:28 [name] ([id number]) [id number] Page 4 of 8 -------------------------------------------------------------------------------- Multi Wound Chart Details Patient Name: Date of Service: [name] [name], WARNCKE. 03/06/2023 8:45 A M Medical Record Number: [id number] Patient Account Number: [id number] Date of Birth/Sex: Treating RN: Jul 10, 1946 (76 Jeffreyo. [name] [name]) [name] [name] Primary Care Daziyah Cogan: [name] [name] Other Clinician: Referring Shilo Pauwels: Treating Natosha Bou/Extender: [name] [name] in Treatment: 2 Vital Signs Height(in): Pulse(bpm): 85 Weight(lbs): Blood Pressure(mmHg): 165/84 Body Mass Index(BMI): Temperature(F): 98.6 Respiratory Rate(breaths/min): 18 [1:Photos:] [N/A:N/A] Right, Anterior Lower Leg N/A N/A Wound Location: Trauma N/A N/A Wounding Event: Abrasion N/A N/A Primary Etiology: Hypertension, Myocardial Infarction, N/A N/A Comorbid History: Peripheral Venous Disease, Received Radiation 02/11/2023 N/A N/A Date Acquired: 2 N/A N/A Weeks of Treatment: Open N/A N/A Wound Status: No N/A N/A Wound Recurrence: 4.2x1x0.1 N/A N/A Measurements L x W x D (cm) 3.299 N/A N/A A (cm) : rea 0.33 N/A N/A Volume (cm) : 81.50% N/A N/A % Reduction in Area: 81.50% N/A N/A % Reduction in Volume: Full Thickness Without Exposed N/A N/A Classification: Support Structures Medium N/A N/A Exudate Amount: Serosanguineous N/A N/A Exudate Type: red, brown N/A N/A Exudate Color: Medium (34-66%) N/A N/A Granulation Amount: Pink N/A N/A Granulation Quality: Medium (34-66%)  N/A N/A Necrotic Amount: Fat Layer (Subcutaneous Tissue): Yes N/A N/A Exposed Structures: Fascia: No Tendon: No Muscle: No Joint: No Bone: No Treatment Notes Electronic Signature(s) Signed: 03/06/2023 10:49:26 AM By: [name] Coria RN Entered By: [name] [name] on 03/06/2023 09:17:42 [name] ([id number]) [id number] Page 5 of 8 -------------------------------------------------------------------------------- Multi-Disciplinary Care Plan Details Patient Name: Date of Service: [name] [name], FURLONG. 03/06/2023 8:45 A M Medical Record Number: [id number] Patient Account Number: [id number] Date of Birth/Sex: Treating RN: Apr 12, 1946 (76 Jeffreyo. [name] [name]) [name] [name] Primary Care Floye Fesler: [name] [name] Other Clinician: Referring Tashira Torre: Treating Daemon Dowty/Extender: [name] [name] in Treatment: 2 Active Inactive Abuse / Safety / Falls / Self  Care Management Nursing Diagnoses: Potential for injury related to falls Goals: Patient will not experience any injury related to falls Date Initiated: 02/19/2023 Target Resolution Date: 03/22/2023 Goal Status: Active Interventions: Assess Activities of Daily Living upon admission and as needed Assess fall risk on admission and as needed Assess: immobility, friction, shearing, incontinence upon admission and as needed Assess impairment of mobility on admission and as needed per policy Assess personal safety and home safety (as indicated) on admission and as needed Assess self care needs on admission and as needed Notes: Electronic Signature(s) Signed: 03/06/2023 10:49:26 AM By: [name] Coria RN Entered By: [name] [name] on 03/06/2023 09:17:55 -------------------------------------------------------------------------------- Pain Assessment Details Patient Name: Date of Service: [name] [name], [name] CK W. 03/06/2023 8:45 A M Medical Record Number: [id number] Patient Account Number: [id number] Date of Birth/Sex: Treating  RN: 07/28/1946 (76 Jeffreyo. [name] [name] Primary Care Dasani Crear: [name] [name] Other Clinician: Referring Johnesha Acheampong: Treating Harlon Kutner/Extender: [name] [name] in Treatment: 2 Active Problems Location of Pain Severity and Description of Pain Patient Has Paino No JALAL, OVERGAARD ([id number]) [phone].pdf Page 6 of 8 Patient Has Paino No Site Locations Pain Management and Medication Current Pain Management: Electronic Signature(s) Signed: 03/06/2023 10:49:26 AM By: [name] Coria RN Entered By: [name] [name] on 03/06/2023 09:09:28 -------------------------------------------------------------------------------- Patient/Caregiver Education Details Patient Name: Date of Service: [name] [name] 3/20/2024andnbsp8:45 A M Medical Record Number: [id number] Patient Account Number: [id number] Date of Birth/Gender: Treating RN: 07/03/1946 (76 Jeffreyo. [name] [name] Primary Care Physician: [name] [name] Other Clinician: Referring Physician: Treating Physician/Extender: [name] [name] in Treatment: 2 Education Assessment Education Provided To: Patient Education Topics Provided Wound/Skin Impairment: Methods: Explain/Verbal Responses: State content correctly Electronic Signature(s) Signed: 03/06/2023 10:49:26 AM By: [name] Coria RN Entered By: [name] [name] on 03/06/2023 09:18:06 [name] ([id number]) [phone].pdf Page 7 of 8 -------------------------------------------------------------------------------- Wound Assessment Details Patient Name: Date of Service: [name] [name], [name] [name] 03/06/2023 8:45 A M Medical Record Number: [id number] Patient Account Number: [id number] Date of Birth/Sex: Treating RN: January 20, 1946 (76 Jeffreyo. [name] [name]) [name] [name] Primary Care Aundra Pung: [name] [name] Other Clinician: Referring Jenetta Wease: Treating Evoleht Hovatter/Extender: [name] [name] in Treatment:  2 Wound Status Wound Number: 1 Primary Abrasion Etiology: Wound Location: Right, Anterior Lower Leg Wound Open Wounding Event: Trauma Status: Date Acquired: 02/11/2023 Comorbid Hypertension, Myocardial Infarction, Peripheral Venous Weeks Of Treatment: 2 History: Disease, Received Radiation Clustered Wound: No Photos Wound Measurements Length: (cm) 4.2 Width: (cm) 1 Depth: (cm) 0.1 Area: (cm) 3.299 Volume: (cm) 0.33 % Reduction in Area: 81.5% % Reduction in Volume: 81.5% Tunneling: No Undermining: No Wound Description Classification: Full Thickness Without Exposed Support Structures Exudate Amount: Medium Exudate Type: Serosanguineous Exudate Color: red, brown Foul Odor After Cleansing: No Slough/Fibrino Yes Wound Bed Granulation Amount: Medium (34-66%) Exposed Structure Granulation Quality: Pink Fascia Exposed: No Necrotic Amount: Medium (34-66%) Fat Layer (Subcutaneous Tissue) Exposed: Yes Necrotic Quality: Adherent Slough Tendon Exposed: No Muscle Exposed: No Joint Exposed: No Bone Exposed: No Treatment Notes Wound #1 (Lower Leg) Wound Laterality: Right, Anterior Cleanser Byram Ancillary Kit - 15 Day Supply Discharge Instruction: Use supplies as instructed; Kit contains: (15) Saline Bullets; (15) 3x3 Gauze; 15 pr [address] HAMIN, MONTOUR ([id number]) [phone].pdf Page 8 of 8 Discharge Instruction: Gently cleanse wound with antibacterial soap, rinse and pat dry prior to dressing wounds Peri-Wound Care Topical Primary Dressing Hydrofera Blue Ready Transfer Foam, 4x5 (in/in) Discharge Instruction: Apply Hydrofera Blue Ready to wound bed as directed Secondary Dressing (BORDER) Zetuvit  Plus SILICONE BORDER Dressing 5x5 (in/in) Discharge Instruction: Please do not put silicone bordered dressings under wraps. Use non-bordered dressing only. Secured With Compression Wrap Compression Stockings Sport and exercise psychologist) Signed: 03/06/2023 10:49:26 AM By: [name] Coria RN Entered By: [name] [name] on 03/06/2023 09:17:00 -------------------------------------------------------------------------------- Vitals Details Patient Name: Date of Service: [name] Mons CK W. 03/06/2023 8:45 A M Medical Record Number: [id number] Patient Account Number: [id number] Date of Birth/Sex: Treating RN: January 03, 1946 (76 Jeffreyo. [name] [name]) [name] [name] Primary Care Zahid Carneiro: [name] [name] Other Clinician: Referring Roniel Halloran: Treating Shatavia Santor/Extender: [name] [name] in Treatment: 2 Vital Signs Time Taken: 09:08 Temperature (F): 98.6 Pulse (bpm): 56 Respiratory Rate (breaths/min): 18 Blood Pressure (mmHg): 165/84 Reference Range: 80 - 120 mg / dl Electronic Signature(s) Signed: 03/06/2023 10:49:26 AM By: [name] Coria RN Entered By: [name] [name] on 03/06/2023 [id number]

## 2023-03-07 NOTE — Progress Notes (Signed)
[name], [name] ([id number]) [id number] Page 1 of 7 Visit Report for 03/06/2023 Chief Complaint Document Details Patient Name: Date of Service: [name] [name], [name] [name]. 03/06/2023 8:45 A M Medical Record Number: [id number] Patient Account Number: [id number] Date of Birth/Sex: Treating RN: Aug 14, 1946 (76 y.o. [name] [name]) [name] Primary Care Provider: Miguel Aschoff Other Clinician: Referring Provider: Treating Provider/Extender: Osvaldo Human in Treatment: 2 Information Obtained from: Patient Chief Complaint 02/20/2023. Patient is here for review of an abrasion injury on the right anterior mid tibia Electronic Signature(s) Signed: 03/06/2023 2:38:02 PM By: Kalman Shan DO Entered By: Kalman Shan on 03/06/2023 09:29:56 -------------------------------------------------------------------------------- Debridement Details Patient Name: Date of Service: [name] 03/06/2023 8:45 A M Medical Record Number: [id number] Patient Account Number: [id number] Date of Birth/Sex: Treating RN: [birth year] (76 y.o. [name] [name]) [name] Primary Care Provider: Miguel Aschoff Other Clinician: Referring Provider: Treating Provider/Extender: Osvaldo Human in Treatment: 2 Debridement Performed for Assessment: Wound #1 Right,Anterior Lower Leg Performed By: Physician Kalman Shan, MD Debridement Type: Debridement Level of Consciousness (Pre-procedure): Awake and Alert Pre-procedure Verification/Time Out Yes - 09:22 Taken: Start Time: 09:22 T Area Debrided (L x W): otal 4.2 (cm) x 1 (cm) = 4.2 (cm) Tissue and other material debrided: Non-Viable, Subcutaneous Level: Skin/Subcutaneous Tissue Debridement Description: Excisional Instrument: Curette Bleeding: Minimum Hemostasis Achieved: Pressure End Time: 09:24 Procedural Pain: 0 Post Procedural Pain: 0 Response to Treatment: Procedure was tolerated well Level of  Consciousness [name], COVIN ([id number]) [id number] Page 2 of 7 Level of Consciousness (Post- Awake and Alert procedure): Post Debridement Measurements of Total Wound Length: (cm) 4.2 Width: (cm) 1 Depth: (cm) 0.1 Volume: (cm) 0.33 Character of Wound/Ulcer Post Debridement: Improved Post Procedure Diagnosis Same as Pre-procedure Electronic Signature(s) Signed: 03/06/2023 10:49:26 AM By: [name] RN Signed: 03/06/2023 2:38:02 PM By: Kalman Shan DO Entered By: [name] on 03/06/2023 09:25:09 -------------------------------------------------------------------------------- HPI Details Patient Name: Date of Service: [name] 03/06/2023 8:45 A M Medical Record Number: [id number] Patient Account Number: [id number] Date of Birth/Sex: Treating RN: 15-Sep-1946 (76 y.o. [name] [name] Primary Care Provider: Miguel Aschoff Other Clinician: Referring Provider: Treating Provider/Extender: Osvaldo Human in Treatment: 2 History of Present Illness HPI Description: Admission 02/17/2022 This is a patient who fell in his room about a week and a half ago. He saw his primary doctor was felt to have cellulitis of the right leg. Noticed to have a wound of the right leg. He has been applying Neosporin since 3/1. Also was placed on Bactrim DS 4 days ago. He is not in a lot of pain. No prior wound history. He has a history of PAD, Parkinson's, B12 deficiency, prostate cancer, nonrheumatic mitral valve disease, MI 1994, B12 deficiency, history of Mohs surgery followed by dermatology and carotid artery stenosis ABI on the right leg was 0.61. 3/12; patient presents for follow-up. He has been using Hydrofera Blue to the wound bed. There is been improvement in healing. Patient has no issues or complaints today. 3/20; patient presents for follow-up. He has been using Hydrofera Blue to the wound bed. Wound is smaller today. Patient has  no issues or complaints. Electronic Signature(s) Signed: 03/06/2023 2:38:02 PM By: Kalman Shan DO Entered By: Kalman Shan on 03/06/2023 09:30:55 [name] ([id number]) [id number] Page 3 of 7 -------------------------------------------------------------------------------- Physical Exam Details Patient Name: Date of Service: [name], [name] 03/06/2023 8:45 A M Medical Record Number: [id number] Patient Account Number: [id number] Date of Birth/Sex: Treating  RN: [birth year] (77 y.o. [name] [name] Primary Care Provider: Miguel Aschoff Other Clinician: Referring Provider: Treating Provider/Extender: Osvaldo Human in Treatment: 2 Constitutional . Cardiovascular . Psychiatric . Notes Right lower extremity: T the anterior mid tibia there is an open wound with granulation tissue, epithelization and nonviable tissue. No evidence of surrounding o soft tissue infection. Electronic Signature(s) Signed: 03/06/2023 2:38:02 PM By: Kalman Shan DO Entered By: Kalman Shan on 03/06/2023 09:31:13 -------------------------------------------------------------------------------- Physician Orders Details Patient Name: Date of Service: [name] 03/06/2023 8:45 A M Medical Record Number: [id number] Patient Account Number: [id number] Date of Birth/Sex: Treating RN: 10-27-1946 (76 y.o. [name] [name] Primary Care Provider: Miguel Aschoff Other Clinician: Referring Provider: Treating Provider/Extender: Osvaldo Human in Treatment: 2 Verbal / Phone Orders: No Diagnosis Coding Follow-up Appointments Return Appointment in 1 week. Bathing/ Shower/ Hygiene May shower; gently cleanse wound with antibacterial soap, rinse and pat dry prior to dressing wounds Edema Control - Lymphedema / Segmental Compressive Device / Other Elevate, Exercise Daily and A void Standing for Long Periods of Time. Elevate  legs to the level of the heart and pump ankles as often as possible Elevate leg(s) parallel to the floor when sitting. JAHSEH, STILLE ([id number]) [id number] Page 4 of 7 Wound Treatment Wound #1 - Lower Leg Wound Laterality: Right, Anterior Cleanser: Byram Ancillary Kit - 15 Day Supply (Generic) Every Other Day/30 Days Discharge Instructions: Use supplies as instructed; Kit contains: (15) Saline Bullets; (15) 3x3 Gauze; 15 pr Gloves Cleanser: Soap and Water Every Other Day/30 Days Discharge Instructions: Gently cleanse wound with antibacterial soap, rinse and pat dry prior to dressing wounds Prim Dressing: Hydrofera Blue Ready Transfer Foam, 4x5 (in/in) Every Other Day/30 Days ary Discharge Instructions: Apply Hydrofera Blue Ready to wound bed as directed Secondary Dressing: (BORDER) Zetuvit Plus SILICONE BORDER Dressing 5x5 (in/in) (Generic) Every Other Day/30 Days Discharge Instructions: Please do not put silicone bordered dressings under wraps. Use non-bordered dressing only. Electronic Signature(s) Signed: 03/06/2023 2:38:02 PM By: Kalman Shan DO Entered By: Kalman Shan on 03/06/2023 09:32:57 -------------------------------------------------------------------------------- Problem List Details Patient Name: Date of Service: [name] 03/06/2023 8:45 A M Medical Record Number: [id number] Patient Account Number: [id number] Date of Birth/Sex: Treating RN: 1946-05-03 (76 y.o. 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 [name] [name]) [name] Primary Care Provider: Miguel Aschoff Other Clinician: Referring Provider: Treating Provider/Extender: Osvaldo Human in Treatment: 2 Subjective Chief Complaint Information obtained from Patient 02/20/2023. Patient is here for review of an abrasion injury on the right anterior mid tibia History of Present Illness (HPI) Admission 02/17/2022 This is a patient who fell in his room about a week and a half ago. He saw his primary doctor was felt to have cellulitis of the right leg. Noticed to  have a wound of the right leg. He has been applying Neosporin since 3/1. Also was placed on Bactrim DS 4 days ago. He is not in a lot of pain. No prior wound history. He has a history of PAD, Parkinson's, B12 deficiency, prostate cancer, nonrheumatic mitral valve disease, MI 1994, B12 deficiency, history of Mohs surgery followed by dermatology and carotid artery stenosis ABI on the right leg was 0.61. 3/12; patient presents for follow-up. He has been using Hydrofera Blue to the wound bed. There is been improvement in healing. Patient has no issues or complaints today. 3/20; patient presents for follow-up. He has been using Hydrofera Blue to the wound bed. Wound is smaller today. Patient has no issues or complaints. Objective Constitutional Vitals Time Taken: 9:08 AM, Temperature: 98.6 F, Pulse: 56 bpm, Respiratory Rate: 18 breaths/min, Blood Pressure: 165/84  mmHg. General Notes: Right lower extremity: T the anterior mid tibia there is an open wound with granulation tissue, epithelization and nonviable tissue. No evidence o of surrounding soft tissue infection. Integumentary (Hair, Skin) Wound #1 status is Open. Original cause of wound was Trauma. The date acquired was: 02/11/2023. The wound has been in treatment 2 weeks. The wound is located on the Right,Anterior Lower Leg. The wound measures 4.2cm length x 1cm width x 0.1cm depth; 3.299cm^2 area and 0.33cm^3 volume. There is Fat Layer (Subcutaneous Tissue) exposed. There is no tunneling or undermining noted. There is a medium amount of serosanguineous drainage noted. There is medium (34-66%) pink granulation within the wound bed. There is a medium (34-66%) amount of necrotic tissue within the wound bed including Adherent Slough. 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 I debrided nonviable tissue. I recommended continue the course with Hydrofera Blue. Follow-up in 1 week. Procedures Wound #1 Pre-procedure diagnosis of Wound #1 is an Abrasion located on the Right,Anterior Lower Leg . There was a Excisional Skin/Subcutaneous Tissue Debridement with a total area of 4.2 sq cm performed by Kalman Shan, MD. With the following instrument(s): Curette to remove Non-Viable tissue/material. Material removed includes Subcutaneous Tissue. No specimens were taken. A time out was conducted at 09:22, prior to the start of the procedure. A Minimum amount of bleeding was controlled with Pressure. The procedure was tolerated well with a pain level of 0 throughout and a pain level of 0 following the procedure.  Post Debridement Measurements: 4.2cm length x 1cm width x 0.1cm depth; 0.33cm^3 volume. Character of Wound/Ulcer Post Debridement is improved. Post procedure Diagnosis Wound #1: Same as Pre-Procedure Plan Follow-up Appointments: Return Appointment in 1 week. Bathing/ Shower/ Hygiene: May shower; gently cleanse wound with antibacterial soap, rinse and pat dry prior to dressing wounds Edema Control - Lymphedema / Segmental Compressive Device / Other: Elevate, Exercise Daily and Avoid Standing for Long Periods of Time. Elevate legs to the level of the heart and pump ankles as often as possible Elevate leg(s) parallel to the floor when sitting. WOUND #1: - Lower Leg Wound Laterality: Right, Anterior Cleanser: Byram Ancillary Kit - 15 Day Supply (Generic) Every Other Day/30 Days Discharge Instructions: Use supplies as instructed; Kit contains: (15) Saline Bullets; (15) 3x3 Gauze; 15 pr Gloves Cleanser: Soap and Water Every Other Day/30 Days Discharge Instructions: Gently cleanse wound with antibacterial soap, rinse and pat dry  prior to dressing wounds Prim Dressing: Hydrofera Blue Ready Transfer Foam, 4x5 (in/in) Every Other Day/30 Days ary Discharge Instructions: Apply Hydrofera Blue Ready to wound bed as directed Secondary Dressing: (BORDER) Zetuvit Plus SILICONE BORDER Dressing 5x5 (in/in) (Generic) Every Other Day/30 Days Discharge Instructions: Please do not put silicone bordered dressings under wraps. Use non-bordered dressing only. 1. In office sharp debridement 2. Hydrofera Blue 3. Follow-up in 1 week Electronic Signature(s) Signed: 03/06/2023 2:38:02 PM By: Kalman Shan DO Entered By: Kalman Shan on 03/06/2023 09:32:40 -------------------------------------------------------------------------------- ROS/PFSH Details Patient Name: Date of Service: [name] 03/06/2023 8:45 A M Medical Record Number: [id number] Patient Account Number: [id number] Date of Birth/Sex:  Treating RN: 06-Mar-1946 (76 y.o. [name] [name]) [name] Primary Care Provider: Miguel Aschoff Other Clinician: Referring Provider: Treating Provider/Extender: Osvaldo Human in Treatment: 2 Cardiovascular Medical History: Positive for: Hypertension; Myocardial Infarction; Peripheral Venous Disease Oncologic ALEXY, STITELER ([id number]) [id number] Page 7 of 7 Medical History: Positive for: Received Radiation - 2020 Immunizations Pneumococcal Vaccine: Received Pneumococcal Vaccination: No Implantable Devices None Family and Social History Never smoker; Marital Status - Married; Alcohol Use: Daily; Drug Use: No History; Caffeine Use: Moderate Electronic Signature(s) Signed: 03/06/2023 10:49:26 AM By: [name] RN Signed: 03/06/2023 2:38:02 PM By: Kalman Shan DO Entered By: Kalman Shan on 03/06/2023 09:33:04 -------------------------------------------------------------------------------- SuperBill Details Patient Name: Date of Service: [name] 03/06/2023 Medical Record Number: [id number] Patient Account Number: [id number] Date of Birth/Sex: Treating RN: 1946/04/08 (76 y.o. 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

## 2023-03-13 ENCOUNTER — Encounter (HOSPITAL_BASED_OUTPATIENT_CLINIC_OR_DEPARTMENT_OTHER): Payer: Medicare HMO | Admitting: Internal Medicine

## 2023-03-13 DIAGNOSIS — S80811D Abrasion, right lower leg, subsequent encounter: Secondary | ICD-10-CM

## 2023-03-13 DIAGNOSIS — Z923 Personal history of irradiation: Secondary | ICD-10-CM | POA: Diagnosis not present

## 2023-03-13 DIAGNOSIS — L97211 Non-pressure chronic ulcer of right calf limited to breakdown of skin: Secondary | ICD-10-CM | POA: Diagnosis not present

## 2023-03-13 DIAGNOSIS — D0439 Carcinoma in situ of skin of other parts of face: Secondary | ICD-10-CM | POA: Diagnosis not present

## 2023-03-13 DIAGNOSIS — I6529 Occlusion and stenosis of unspecified carotid artery: Secondary | ICD-10-CM | POA: Diagnosis not present

## 2023-03-13 DIAGNOSIS — Z8546 Personal history of malignant neoplasm of prostate: Secondary | ICD-10-CM | POA: Diagnosis not present

## 2023-03-13 DIAGNOSIS — L97911 Non-pressure chronic ulcer of unspecified part of right lower leg limited to breakdown of skin: Secondary | ICD-10-CM | POA: Diagnosis not present

## 2023-03-13 DIAGNOSIS — I252 Old myocardial infarction: Secondary | ICD-10-CM | POA: Diagnosis not present

## 2023-03-13 DIAGNOSIS — L97811 Non-pressure chronic ulcer of other part of right lower leg limited to breakdown of skin: Secondary | ICD-10-CM | POA: Diagnosis not present

## 2023-03-13 DIAGNOSIS — I10 Essential (primary) hypertension: Secondary | ICD-10-CM | POA: Diagnosis not present

## 2023-03-13 DIAGNOSIS — G20A1 Parkinson's disease without dyskinesia, without mention of fluctuations: Secondary | ICD-10-CM | POA: Diagnosis not present

## 2023-03-13 NOTE — Progress Notes (Signed)
CHARON, BRANDES ([id number]) [id number] Page 1 of 7 Visit Report for 03/13/2023 Arrival Information Details Patient Name: Date of Service: [name] [name], ITZKOWITZ CK W. 03/13/2023 8:00 A M Medical Record Number: [id number] Patient Account Number: [id number] Date of Birth/Sex: Treating RN: 04-Aug-1946 (77 y.o. [name] [name]) [name] Primary Care Renny Gunnarson: Miguel Aschoff Other Clinician: Referring Shakeitha Umbaugh: Treating Rockland Kotarski/Extender: Osvaldo Human in Treatment: 3 Visit Information History Since Last Visit Added or deleted any medications: No Patient Arrived: Ambulatory Any new allergies or adverse reactions: No Arrival Time: 08:12 Had a fall or experienced change in No Accompanied By: self activities of daily living that may affect Transfer Assistance: None risk of falls: Patient Identification Verified: Yes Signs or symptoms of abuse/neglect since last visito No Secondary Verification Process Completed: Yes Hospitalized since last visit: No Patient Requires Transmission-Based Precautions: No Implantable device outside of the clinic excluding No Patient Has Alerts: No cellular tissue based products placed in the center since last visit: Has Dressing in Place as Prescribed: Yes Pain Present Now: No Electronic Signature(s) Signed: 03/13/2023 4:25:27 PM By: [name] RN Entered By: [name] on 03/13/2023 08:18:48 -------------------------------------------------------------------------------- Clinic Level of Care Assessment Details Patient Name: Date of Service: [name] [name], KERWICK CK W. 03/13/2023 8:00 A M Medical Record Number: [id number] Patient Account Number: [id number] Date of Birth/Sex: Treating RN: 08/07/1946 (77 y.o. [name] [name]) [name] Primary Care Jamya Starry: Miguel Aschoff Other Clinician: Referring Dalena Plantz: Treating Jerrik Housholder/Extender: Osvaldo Human in Treatment: 3 Clinic Level of Care Assessment Items TOOL 1 Quantity  Score []  - 0 Use when EandM and Procedure is performed on INITIAL visit ASSESSMENTS - Nursing Assessment / Reassessment []  - 0 General Physical Exam (combine w/ comprehensive assessment (listed just below) when performed on new pt. evals) []  - 0 Comprehensive Assessment (HX, ROS, Risk Assessments, Wounds Hx, etc.) ASSESSMENTS - Wound and Skin Assessment / Reassessment []  - 0 Dermatologic / Skin Assessment (not related to wound area) ASSESSMENTS - Ostomy and/or Continence Assessment and Care []  - 0 Incontinence Assessment and Management []  - 0 Ostomy Care Assessment and Management (repouching, etc.) PROCESS - Coordination of Care []  - 0 Simple Patient / Family Education for ongoing care []  - 0 Complex (extensive) Patient / Family Education for ongoing care []  - 0 Staff obtains Programmer, systems, Records, T Results / Process Orders est []  - 0 Staff telephones HHA, Nursing Homes / Clarify orders / etc []  - 0 Routine Transfer to another Facility (non-emergent condition) []  - 0 Routine Hospital Admission (non-emergent condition) []  - 0 New Admissions / Insurance Authorizations / Ordering NPWT Apligraf, etcDSHAUN, DEETS ([id number]) [id number] Page 2 of 7 []  - 0 Emergency Hospital Admission (emergent condition) PROCESS - Special Needs []  - 0 Pediatric / Minor Patient Management []  - 0 Isolation Patient Management []  - 0 Hearing / Language / Visual special needs []  - 0 Assessment of Community assistance (transportation, D/C planning, etc.) []  - 0 Additional assistance / Altered mentation []  - 0 Support Surface(s) Assessment (bed, cushion, seat, etc.) INTERVENTIONS - Miscellaneous []  - 0 External ear exam []  - 0 Patient Transfer (multiple staff / Civil Service fast streamer / Similar devices) []  - 0 Simple Staple / Suture removal (25 or less) []  - 0 Complex Staple / Suture removal (26 or more) []  - 0 Hypo/Hyperglycemic Management (do not check if billed  separately) []  - 0 Ankle / Brachial Index (ABI) - do not check if billed separately Has the patient been seen at the hospital within the  last three years: Yes Total Score: 0 Level Of Care: ____ Electronic Signature(s) Signed: 03/13/2023 4:25:27 PM By: [name] RN Entered By: [name] on 03/13/2023 08:40:34 -------------------------------------------------------------------------------- Encounter Discharge Information Details Patient Name: Date of Service: [name] [name] CK W. 03/13/2023 8:00 A M Medical Record Number: [id number] Patient Account Number: [id number] Date of Birth/Sex: Treating RN: 1946-03-05 (77 y.o. [name] [name]) [name] Primary Care Jaquise Faux: Miguel Aschoff Other Clinician: Referring Chijioke Lasser: Treating Torey Reinard/Extender: Osvaldo Human in Treatment: 3 Encounter Discharge Information Items Post Procedure Vitals Discharge Condition: Stable Temperature (F): 97.8 Ambulatory Status: Ambulatory Pulse (bpm): 68 Discharge Destination: Home Respiratory Rate (breaths/min): 18 Transportation: Private Auto Blood Pressure (mmHg): 176/89 Accompanied By: self Schedule Follow-up Appointment: Yes Clinical Summary of Care: Electronic Signature(s) Signed: 03/13/2023 4:25:27 PM By: [name] RN Entered By: [name] on 03/13/2023 08:41:19 -------------------------------------------------------------------------------- Lower Extremity Assessment Details Patient Name: Date of Service: [name] [name] CK W. 03/13/2023 8:00 A M Medical Record Number: [id number] Patient Account Number: [id number] Date of Birth/Sex: Treating RN: 1945/12/21 (77 y.o. [name] Primary Care Dequan Kindred: Miguel Aschoff Other Clinician: Referring Jaylie Neaves: Treating Anniebell Bedore/Extender: Osvaldo Human in Treatment: 3 Edema Assessment Assessed: [Left: No] [Right: No] Edema: [Left: N] [Right: o] L[LeftGRAM, CAPLE [id number] [Right:  [id number] Page 3 of 7] Calf Left: Right: Point of Measurement: 33 cm From Medial Instep 33 cm Ankle Left: Right: Point of Measurement: 10 cm From Medial Instep 22 cm Vascular Assessment Pulses: Dorsalis Pedis Palpable: [Right:Yes] Electronic Signature(s) Signed: 03/13/2023 4:25:27 PM By: [name] RN Entered By: [name] on 03/13/2023 08:25:35 -------------------------------------------------------------------------------- Multi Wound Chart Details Patient Name: Date of Service: [name] [name] CK W. 03/13/2023 8:00 A M Medical Record Number: [id number] Patient Account Number: [id number] Date of Birth/Sex: Treating RN: 11-29-1946 (77 y.o. [name] [name]) [name] Primary Care Makale Pindell: Miguel Aschoff Other Clinician: Referring Rally Ouch: Treating Decklan Mau/Extender: Osvaldo Human in Treatment: 3 Vital Signs Height(in): Pulse(bpm): 68 Weight(lbs): Blood Pressure(mmHg): 176/89 Body Mass Index(BMI): Temperature(F): 97.8 Respiratory Rate(breaths/min): 18 [1:Photos:] [N/A:N/A] Right, Anterior Lower Leg N/A N/A Wound Location: Trauma N/A N/A Wounding Event: Abrasion N/A N/A Primary Etiology: Hypertension, Myocardial Infarction, N/A N/A Comorbid History: Peripheral Venous Disease, Received Radiation 02/11/2023 N/A N/A Date Acquired: 3 N/A N/A Weeks of Treatment: Open N/A N/A Wound Status: No N/A N/A Wound Recurrence: 2.5x0.6x0.1 N/A N/A Measurements L x W x D (cm) 1.178 N/A N/A A (cm) : rea 0.118 N/A N/A Volume (cm) : 93.40% N/A N/A % Reduction in Area: 93.40% N/A N/A % Reduction in Volume: Full Thickness Without Exposed N/A N/A Classification: Support Structures Medium N/A N/A Exudate Amount: Serosanguineous N/A N/A Exudate Type: red, brown N/A N/A Exudate Color: Medium (34-66%) N/A N/A Granulation Amount: Pink N/A N/A Granulation Quality: Medium (34-66%) N/A N/A Necrotic Amount: Fat Layer  (Subcutaneous Tissue): Yes N/A N/A Exposed Structures: Fascia: No Tendon: No ADIEL, HORNG ([id number]) [id number] Page 4 of 7 Muscle: No Joint: No Bone: No Debridement - Selective/Open Wound N/A N/A Debridement: Pre-procedure Verification/Time Out 08:35 N/A N/A Taken: [facility] N/A N/A Tissue Debrided: Non-Viable Tissue N/A N/A Level: 1.5 N/A N/A Debridement A (sq cm): rea Curette N/A N/A Instrument: Minimum N/A N/A Bleeding: Pressure N/A N/A Hemostasis A chieved: 0 N/A N/A Procedural Pain: 0 N/A N/A Post Procedural Pain: Procedure was tolerated well N/A N/A Debridement Treatment Response: 2.5x0.6x0.1 N/A N/A Post Debridement Measurements L x W x D (cm) 0.118 N/A N/A Post Debridement Volume: (cm) Debridement N/A N/A Procedures Performed: Treatment  Notes Wound #1 (Lower Leg) Wound Laterality: Right, Anterior Cleanser Byram Ancillary Kit - 15 Day Supply Discharge Instruction: Use supplies as instructed; Kit contains: (15) Saline Bullets; (15) 3x3 Gauze; 15 pr Gloves Soap and Water Discharge Instruction: Gently cleanse wound with antibacterial soap, rinse and pat dry prior to dressing wounds Peri-Wound Care Topical Primary Dressing Hydrofera Blue Ready Transfer Foam, 4x5 (in/in) Discharge Instruction: Apply Hydrofera Blue Ready to wound bed as directed Secondary Dressing (BORDER) Zetuvit Plus SILICONE BORDER Dressing 5x5 (in/in) Discharge Instruction: Please do not put silicone bordered dressings under wraps. Use non-bordered dressing only. Secured With Compression Wrap Compression Stockings Add-Ons Electronic Signature(s) Signed: 03/13/2023 11:48:34 AM By: Kalman Shan DO Entered By: Kalman Shan on 03/13/2023 08:51:27 -------------------------------------------------------------------------------- Multi-Disciplinary Care Plan Details Patient Name: Date of Service: [name] [name] CK W. 03/13/2023 8:00 A M Medical Record Number:  [id number] Patient Account Number: [id number] Date of Birth/Sex: Treating RN: [birth year] (77 y.o. [name] [name]) [name] Primary Care Tiyanna Larcom: Miguel Aschoff Other Clinician: Referring Charlise Giovanetti: Treating Denman Pichardo/Extender: Osvaldo Human in Treatment: 3 Active Inactive Abuse / Safety / Falls / Self Care Management Nursing Diagnoses: Potential for injury related to falls Goals: Patient will not experience any injury related to falls KELL, ELPERS ([id number]) [id number] Page 5 of 7 Date Initiated: 02/19/2023 Target Resolution Date: 03/22/2023 Goal Status: Active Interventions: Assess Activities of Daily Living upon admission and as needed Assess fall risk on admission and as needed Assess: immobility, friction, shearing, incontinence upon admission and as needed Assess impairment of mobility on admission and as needed per policy Assess personal safety and home safety (as indicated) on admission and as needed Assess self care needs on admission and as needed Notes: Electronic Signature(s) Signed: 03/13/2023 4:25:27 PM By: [name] RN Entered By: [name] on 03/13/2023 08:26:01 -------------------------------------------------------------------------------- Pain Assessment Details Patient Name: Date of Service: [name] [name] CK W. 03/13/2023 8:00 A M Medical Record Number: [id number] Patient Account Number: [id number] Date of Birth/Sex: Treating RN: [birth year] (77 y.o. [name] [name]) [name] Primary Care Jahara Dail: Miguel Aschoff Other Clinician: Referring Jennet Scroggin: Treating Ilaria Much/Extender: Osvaldo Human in Treatment: 3 Active Problems Location of Pain Severity and Description of Pain Patient Has Paino No Site Locations Pain Management and Medication Current Pain Management: Electronic Signature(s) Signed: 03/13/2023 4:25:27 PM By: [name] RN Entered By: [name] on 03/13/2023  08:19:41 -------------------------------------------------------------------------------- Patient/Caregiver Education Details Patient Name: Date of Service: Lohn, JA CK W. 3/27/2024andnbsp8:00 A M Medical Record Number: [id number] Patient Account Number: [id number] Date of Birth/Gender: Treating RN: 10/04/1946 (77 y.o. [name] Primary Care Physician: Miguel Aschoff Other Clinician: Referring Physician: Treating Physician/Extender: Osvaldo Human in Treatment: 3 Education Assessment [name] ([id number]) [id number] Page 6 of 7 Education Provided To: Patient Education Topics Provided Wound/Skin Impairment: Handouts: Caring for Your Ulcer Methods: Explain/Verbal Responses: State content correctly Electronic Signature(s) Signed: 03/13/2023 4:25:27 PM By: [name] RN Entered By: [name] on 03/13/2023 08:26:32 -------------------------------------------------------------------------------- Wound Assessment Details Patient Name: Date of Service: [name] [name] CK W. 03/13/2023 8:00 A M Medical Record Number: [id number] Patient Account Number: [id number] Date of Birth/Sex: Treating RN: [birth year] (77 y.o. [name] [name]) [name] Primary Care Giovanny Dugal: Miguel Aschoff Other Clinician: Referring Mayda Shippee: Treating Hayat Warbington/Extender: Osvaldo Human in Treatment: 3 Wound Status Wound Number: 1 Primary Abrasion Etiology: Wound Location: Right, Anterior Lower Leg Wound Open Wounding Event: Trauma Status: Date Acquired: 02/11/2023 Comorbid Hypertension, Myocardial Infarction, Peripheral Venous Weeks Of Treatment: 3 History: Disease, Received Radiation  Clustered Wound: No Photos Wound Measurements Length: (cm) 2.5 Width: (cm) 0.6 Depth: (cm) 0.1 Area: (cm) 1.178 Volume: (cm) 0.118 % Reduction in Area: 93.4% % Reduction in Volume: 93.4% Tunneling: No Undermining: No Wound  Description Classification: Full Thickness Without Exposed Suppor Exudate Amount: Medium Exudate Type: Serosanguineous Exudate Color: red, brown t Structures Foul Odor After Cleansing: No Slough/Fibrino Yes Wound Bed Granulation Amount: Medium (34-66%) Exposed Structure Granulation Quality: Pink Fascia Exposed: No Necrotic Amount: Medium (34-66%) Fat Layer (Subcutaneous Tissue) Exposed: Yes Necrotic Quality: Adherent Slough Tendon Exposed: No Muscle Exposed: No Joint Exposed: No Bone Exposed: No CHRISTOPHR, MILETO ([id number]) [id number] Page 7 of 7 Treatment Notes Wound #1 (Lower Leg) Wound Laterality: Right, Anterior Cleanser Byram Ancillary Kit - 15 Day Supply Discharge Instruction: Use supplies as instructed; Kit contains: (15) Saline Bullets; (15) 3x3 Gauze; 15 pr Gloves Soap and Water Discharge Instruction: Gently cleanse wound with antibacterial soap, rinse and pat dry prior to dressing wounds Peri-Wound Care Topical Primary Dressing Hydrofera Blue Ready Transfer Foam, 4x5 (in/in) Discharge Instruction: Apply Hydrofera Blue Ready to wound bed as directed Secondary Dressing (BORDER) Zetuvit Plus SILICONE BORDER Dressing 5x5 (in/in) Discharge Instruction: Please do not put silicone bordered dressings under wraps. Use non-bordered dressing only. Secured With Compression Wrap Compression Stockings Environmental education officer) Signed: 03/13/2023 4:25:27 PM By: [name] RN Entered By: [name] on 03/13/2023 08:24:40 -------------------------------------------------------------------------------- Vitals Details Patient Name: Date of Service: [name] [name] CK W. 03/13/2023 8:00 A M Medical Record Number: [id number] Patient Account Number: [id number] Date of Birth/Sex: Treating RN: [birth year] (77 y.o. [name] [name]) [name] Primary Care Adaysha Dubinsky: Miguel Aschoff Other Clinician: Referring Matisse Salais: Treating Annett Boxwell/Extender: Osvaldo Human in Treatment: 3 Vital Signs Time Taken: 08:18 Temperature (F): 97.8 Pulse (bpm): 68 Respiratory Rate (breaths/min): 18 Blood Pressure (mmHg): 176/89 Reference Range: 80 - 120 mg / dl Electronic Signature(s) Signed: 03/13/2023 4:25:27 PM By: [name] RN Entered By: [name] on 03/13/2023 08:19:33

## 2023-03-13 NOTE — Progress Notes (Signed)
KAOS, HOYE ([id number]) [id number] Page 1 of 6 Visit Report for 03/13/2023 Chief Complaint Document Details Patient Name: Date of Service: [name] [name], [name] [name]. 03/13/2023 8:00 A M Medical Record Number: [id number] Patient Account Number: [id number] Date of Birth/Sex: Treating RN: 05-Nov-1946 (76 y.o. [name] [name]) [name] Primary Care Provider: Miguel Aschoff Other Clinician: Referring Provider: Treating Provider/Extender: Osvaldo Human in Treatment: 3 Information Obtained from: Patient Chief Complaint 02/20/2023. Patient is here for review of an abrasion injury on the right anterior mid tibia Electronic Signature(s) Signed: 03/13/2023 11:48:34 AM By: Kalman Shan DO Entered By: Kalman Shan on 03/13/2023 08:47:02 -------------------------------------------------------------------------------- Debridement Details Patient Name: Date of Service: [name] 03/13/2023 8:00 A M Medical Record Number: [id number] Patient Account Number: [id number] Date of Birth/Sex: Treating RN: Jun 08, 1946 (76 y.o. [name] [name]) [name] Primary Care Provider: Miguel Aschoff Other Clinician: Referring Provider: Treating Provider/Extender: Osvaldo Human in Treatment: 3 Debridement Performed for Assessment: Wound #1 Right,Anterior Lower Leg Performed By: Physician Kalman Shan, MD Debridement Type: Debridement Level of Consciousness (Pre-procedure): Awake and Alert Pre-procedure Verification/Time Out Yes - 08:35 Taken: Start Time: 08:35 T Area Debrided (L x W): otal 2.5 (cm) x 0.6 (cm) = 1.5 (cm) Tissue and other material debrided: Viable, Non-Viable, Slough, Slough Level: Non-Viable Tissue Debridement Description: Selective/Open Wound Instrument: Curette Bleeding: Minimum Hemostasis Achieved: Pressure End Time: 08:37 Procedural Pain: 0 Post Procedural Pain: 0 Response to Treatment: Procedure was tolerated well Level  of Consciousness (Post- Awake and Alert procedure): Post Debridement Measurements of Total Wound Length: (cm) 2.5 Width: (cm) 0.6 Depth: (cm) 0.1 Volume: (cm) 0.118 Character of Wound/Ulcer Post Debridement: Improved Post Procedure Diagnosis Same as Pre-procedure Electronic Signature(s) Signed: 03/13/2023 11:48:34 AM By: Kalman Shan DO Signed: 03/13/2023 4:25:27 PM By: [name] RN Entered By: [name] on 03/13/2023 08:40:07 [name] ([id number]) [id number] Page 2 of 6 -------------------------------------------------------------------------------- HPI Details Patient Name: Date of Service: [name], [name]. 03/13/2023 8:00 A M Medical Record Number: [id number] Patient Account Number: [id number] Date of Birth/Sex: Treating RN: Apr 24, 1946 (76 y.o. [name] [name]) [name] Primary Care Provider: Miguel Aschoff Other Clinician: Referring Provider: Treating Provider/Extender: Osvaldo Human in Treatment: 3 History of Present Illness HPI Description: Admission 02/17/2022 This is a patient who fell in his room about a week and a half ago. He saw his primary doctor was felt to have cellulitis of the right leg. Noticed to have a wound of the right leg. He has been applying Neosporin since 3/1. Also was placed on Bactrim DS 4 days ago. He is not in a lot of pain. No prior wound history. He has a history of PAD, Parkinson's, B12 deficiency, prostate cancer, nonrheumatic mitral valve disease, MI 1994, B12 deficiency, history of Mohs surgery followed by dermatology and carotid artery stenosis ABI on the right leg was 0.61. 3/12; patient presents for follow-up. He has been using Hydrofera Blue to the wound bed. There is been improvement in healing. Patient has no issues or complaints today. 3/20; patient presents for follow-up. He has been using Hydrofera Blue to the wound bed. Wound is smaller today. Patient has no issues or  complaints. 3/27; patient presents for follow-up. He has been using Hydrofera Blue to the wound bed. The wound is smaller today. He has no issues or complaints today. Electronic Signature(s) Signed: 03/13/2023 11:48:34 AM By: Kalman Shan DO Entered By: Kalman Shan on 03/13/2023 08:49:12 -------------------------------------------------------------------------------- Physical Exam Details Patient Name: Date of Service: [name] CK  W. 03/13/2023 8:00 A M Medical Record Number: [id number] Patient Account Number: [id number] Date of Birth/Sex: Treating RN: 11-Feb-1946 (76 y.o. [name] [name]) [name] Primary Care Provider: Miguel Aschoff Other Clinician: Referring Provider: Treating Provider/Extender: Osvaldo Human in Treatment: 3 Constitutional . Cardiovascular . Psychiatric . Notes Right lower extremity: T the anterior mid tibia there is an open wound with granulation tissue, epithelization and slough. No evidence of surrounding soft tissue o infection. Electronic Signature(s) Signed: 03/13/2023 11:48:34 AM By: Kalman Shan DO Entered By: Kalman Shan on 03/13/2023 08:50:21 -------------------------------------------------------------------------------- Physician Orders Details Patient Name: Date of Service: [name] 03/13/2023 8:00 A M Medical Record Number: [id number] Patient Account Number: [id number] Date of Birth/Sex: Treating RN: October 10, 1946 (76 y.o. [name] [name]) [name] Primary Care Provider: Miguel Aschoff Other Clinician: Referring Provider: Treating Provider/Extender: Osvaldo Human in Treatment: 3 BODIN, WIGGER ([id number]) [id number] Page 3 of 6 Verbal / Phone Orders: No Diagnosis Coding Follow-up Appointments Return Appointment in 1 week. Bathing/ Shower/ Hygiene May shower; gently cleanse wound with antibacterial soap, rinse and pat dry prior to dressing wounds Edema  Control - Lymphedema / Segmental Compressive Device / Other Elevate, Exercise Daily and A void Standing for Long Periods of Time. Elevate legs to the level of the heart and pump ankles as often as possible Elevate leg(s) parallel to the floor when sitting. Wound Treatment Wound #1 - Lower Leg Wound Laterality: Right, Anterior Cleanser: Byram Ancillary Kit - 15 Day Supply (Generic) Every Other Day/30 Days Discharge Instructions: Use supplies as instructed; Kit contains: (15) Saline Bullets; (15) 3x3 Gauze; 15 pr Gloves Cleanser: Soap and Water Every Other Day/30 Days Discharge Instructions: Gently cleanse wound with antibacterial soap, rinse and pat dry prior to dressing wounds Prim Dressing: Hydrofera Blue Ready Transfer Foam, 4x5 (in/in) Every Other Day/30 Days ary Discharge Instructions: Apply Hydrofera Blue Ready to wound bed as directed Secondary Dressing: (BORDER) Zetuvit Plus SILICONE BORDER Dressing 5x5 (in/in) (Generic) Every Other Day/30 Days Discharge Instructions: Please do not put silicone bordered dressings under wraps. Use non-bordered dressing only. Electronic Signature(s) Signed: 03/13/2023 11:48:34 AM By: Kalman Shan DO Entered By: Kalman Shan on 03/13/2023 08:51:14 -------------------------------------------------------------------------------- Problem List Details Patient Name: Date of Service: [name] 03/13/2023 8:00 A M Medical Record Number: [id number] Patient Account Number: [id number] Date of Birth/Sex: Treating RN: [birth year] (76 y.o. 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 03/13/2023 8:00 A M Medical Record Number: [id number] Patient Account Number: [id number] Date of Birth/Sex: Treating RN: [birth year] (76 y.o. [name] [name]) [name] Primary Care Provider: Miguel Aschoff Other Clinician: Referring Provider: Treating Provider/Extender: Osvaldo Human in Treatment: 3 Subjective Chief Complaint Information obtained from Patient 02/20/2023. Patient is here for review of an abrasion injury on the right anterior mid tibia History of Present Illness (HPI) Admission 02/17/2022 This is a patient who fell in his room about a week and a half ago.  He saw his primary doctor was felt to have cellulitis of the right leg. Noticed to have a wound of the right leg. He has been applying Neosporin since 3/1. Also was placed on Bactrim DS 4 days ago. He is not in a lot of pain. No prior wound history. He has a history of PAD, Parkinson's, B12 deficiency, prostate cancer, nonrheumatic mitral valve disease, MI 1994, B12 deficiency, history of Mohs surgery followed by dermatology and carotid artery stenosis ABI on the right leg was 0.61. 3/12; patient presents for follow-up. He has been using Hydrofera Blue to the wound bed. There is been improvement in healing. Patient has no issues or complaints today. 3/20; patient presents for follow-up. He has been using Hydrofera Blue to the wound bed. Wound is smaller today. Patient has no issues or complaints. 3/27; patient presents for follow-up. He has been using Hydrofera Blue to the wound bed.  The wound is smaller today. He has no issues or complaints today. Objective Constitutional Vitals Time Taken: 8:18 AM, Temperature: 97.8 F, Pulse: 68 bpm, Respiratory Rate: 18 breaths/min, Blood Pressure: 176/89 mmHg. General Notes: Right lower extremity: T the anterior mid tibia there is an open wound with granulation tissue, epithelization and slough. No evidence of o surrounding soft tissue infection. Integumentary (Hair, Skin) Wound #1 status is Open. Original cause of wound was Trauma. The date acquired was: 02/11/2023. The wound has been in treatment 3 weeks. The wound is located on the Right,Anterior Lower Leg. The wound measures 2.5cm length x 0.6cm width x 0.1cm depth; 1.178cm^2 area and 0.118cm^3 volume. There is Fat Layer (Subcutaneous Tissue) exposed. There is no tunneling or undermining noted. There is a medium amount of serosanguineous drainage noted. There is medium (34-66%) pink granulation within the wound bed. There is a medium (34-66%) amount of necrotic tissue within the wound bed including Adherent Slough. 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 I debrided nonviable tissue. I recommended continue the course with Hydrofera Blue. Follow-up in 2 weeks. Procedures Wound #1 Pre-procedure diagnosis of Wound #1 is an Abrasion located on the Right,Anterior Lower Leg . There was a Selective/Open Wound Non-Viable Tissue Debridement with a total area of 1.5 sq cm performed by Kalman Shan, MD. With the following instrument(s): Curette to remove Viable and Non-Viable tissue/material. Material removed includes [place]. No specimens were taken. A time out was conducted at 08:35, prior to the start of the procedure. BARI, DOORLEY ([id number])  [id number] Page 5 of 6 Minimum amount of bleeding was controlled with Pressure. The procedure was tolerated well with a pain level of 0 throughout and a pain level of 0 following the procedure. Post Debridement Measurements: 2.5cm length x 0.6cm width x 0.1cm depth; 0.118cm^3 volume. Character of Wound/Ulcer Post Debridement is improved. Post procedure Diagnosis Wound #1: Same as Pre-Procedure Plan Follow-up Appointments: Return Appointment in 1 week. Bathing/ Shower/ Hygiene: May shower; gently cleanse wound with antibacterial soap, rinse and pat dry prior to dressing wounds Edema Control - Lymphedema / Segmental Compressive Device / Other: Elevate, Exercise Daily and Avoid Standing for Long Periods of Time. Elevate legs to the level of the heart and pump ankles as often as possible Elevate leg(s) parallel to the floor when sitting. WOUND #1: - Lower Leg Wound Laterality: Right, Anterior Cleanser: Byram Ancillary Kit - 15  Day Supply (Generic) Every Other Day/30 Days Discharge Instructions: Use supplies as instructed; Kit contains: (15) Saline Bullets; (15) 3x3 Gauze; 15 pr Gloves Cleanser: Soap and Water Every Other Day/30 Days Discharge Instructions: Gently cleanse wound with antibacterial soap, rinse and pat dry prior to dressing wounds Prim Dressing: Hydrofera Blue Ready Transfer Foam, 4x5 (in/in) Every Other Day/30 Days ary Discharge Instructions: Apply Hydrofera Blue Ready to wound bed as directed Secondary Dressing: (BORDER) Zetuvit Plus SILICONE BORDER Dressing 5x5 (in/in) (Generic) Every Other Day/30 Days Discharge Instructions: Please do not put silicone bordered dressings under wraps. Use non-bordered dressing only. 1. In office sharp debridement 2. Hydrofera Blue 3. Follow-up in 2 weeks Electronic Signature(s) Signed: 03/13/2023 11:48:34 AM By: Kalman Shan DO Entered By: Kalman Shan on 03/13/2023  08:50:57 -------------------------------------------------------------------------------- ROS/PFSH Details Patient Name: Date of Service: [name] 03/13/2023 8:00 A M Medical Record Number: [id number] Patient Account Number: [id number] Date of Birth/Sex: Treating RN: May 18, 1946 (76 y.o. [name] [name]) [name] Primary Care Provider: Miguel Aschoff Other Clinician: Referring Provider: Treating Provider/Extender: Osvaldo Human in Treatment: 3 Cardiovascular Medical History: Positive for: Hypertension; Myocardial Infarction; Peripheral Venous Disease Oncologic Medical History: Positive for: Received Radiation - 2020 Immunizations Pneumococcal Vaccine: Received Pneumococcal Vaccination: No Implantable Devices None Family and Social History Never smoker; Marital Status - Married; Alcohol Use: Daily; Drug Use: No History; Caffeine Use: Moderate Electronic Signature(s) Signed: 03/13/2023 11:48:34 AM By: Kalman Shan DO Signed: 03/13/2023 4:25:27 PM By: [name] RN Entered By: Kalman Shan on 03/13/2023 08:51:22 [name] ([id number]) [id number] Page 6 of 6 -------------------------------------------------------------------------------- SuperBill Details Patient Name: Date of Service: THI, SOLDNER 03/13/2023 Medical Record Number: [id number] Patient Account Number: [id number] Date of Birth/Sex: Treating RN: [birth year] (76 y.o. 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

## 2023-03-27 ENCOUNTER — Encounter: Payer: Medicare HMO | Attending: Internal Medicine | Admitting: Internal Medicine

## 2023-03-27 DIAGNOSIS — S80811D Abrasion, right lower leg, subsequent encounter: Secondary | ICD-10-CM

## 2023-03-27 DIAGNOSIS — S80811A Abrasion, right lower leg, initial encounter: Secondary | ICD-10-CM | POA: Diagnosis not present

## 2023-03-27 DIAGNOSIS — L97812 Non-pressure chronic ulcer of other part of right lower leg with fat layer exposed: Secondary | ICD-10-CM | POA: Insufficient documentation

## 2023-03-27 DIAGNOSIS — W19XXXA Unspecified fall, initial encounter: Secondary | ICD-10-CM | POA: Diagnosis not present

## 2023-03-27 DIAGNOSIS — L97911 Non-pressure chronic ulcer of unspecified part of right lower leg limited to breakdown of skin: Secondary | ICD-10-CM

## 2023-03-28 NOTE — Progress Notes (Signed)
[name] [name], [name] [name] ([id number]) [id number] Page 1 of 7 Visit Report for 03/27/2023 Arrival Information Details Patient Name: Date of Service: [name] [name]. 03/27/2023 8:00 A M Medical Record Number: [id number] Patient Account Number: [id number] Date of Birth/Sex: Treating RN: 29-Apr-1946 (77 y.o. [name] [name]) Yevonne PaxEpps, Carrie Primary Care Rayvn Rickerson: Julieanne MansonGilbert, Richard Other Clinician: Referring Korianna Washer: Treating Shyloh Krinke/Extender: Estill BakesHoffman, Jessica Gilbert, Richard Weeks in Treatment: 5 Visit Information History Since Last Visit Added or deleted any medications: No Patient Arrived: Ambulatory Any new allergies or adverse reactions: No Arrival Time: 08:03 Had a fall or experienced change in No Accompanied By: self activities of daily living that may affect Transfer Assistance: None risk of falls: Patient Identification Verified: Yes Signs or symptoms of abuse/neglect since last visito No Secondary Verification Process Completed: Yes Hospitalized since last visit: No Patient Requires Transmission-Based Precautions: No Implantable device outside of the clinic excluding No Patient Has Alerts: No cellular tissue based products placed in the center since last visit: Has Dressing in Place as Prescribed: Yes Pain Present Now: No Electronic Signature(s) Signed: 03/28/2023 4:28:11 PM By: Yevonne PaxEpps, Carrie RN Entered By: Yevonne PaxEpps, Carrie on 03/27/2023 08:07:08 -------------------------------------------------------------------------------- Clinic Level of Care Assessment Details Patient Name: Date of Service: [name] [name]. 03/27/2023 8:00 A M Medical Record Number: [id number] Patient Account Number: [id number] Date of Birth/Sex: Treating RN: 29-Apr-1946 (77 y.o. [name] [name]) Yevonne PaxEpps, Carrie Primary Care Kahner Yanik: Julieanne MansonGilbert, Richard Other Clinician: Referring Ladarien Beeks: Treating Bettina Warn/Extender: Estill BakesHoffman, Jessica Gilbert, Richard Weeks in Treatment: 5 Clinic Level of Care Assessment Items TOOL 1 Quantity  Score []  - 0 Use when EandM and Procedure is performed on INITIAL visit ASSESSMENTS - Nursing Assessment / Reassessment []  - 0 General Physical Exam (combine [name]/ comprehensive assessment (listed just below) when performed on new pt. evals) []  - 0 Comprehensive Assessment (HX, ROS, Risk Assessments, Wounds Hx, etc.) ASSESSMENTS - Wound and Skin Assessment / Reassessment []  - 0 Dermatologic / Skin Assessment (not related to wound area) ASSESSMENTS - Ostomy and/or Continence Assessment and Care []  - 0 Incontinence Assessment and Management []  - 0 Ostomy Care Assessment and Management (repouching, etc.) PROCESS - Coordination of Care []  - 0 Simple Patient / Family Education for ongoing care []  - 0 Complex (extensive) Patient / Family Education for ongoing care []  - 0 Staff obtains ChiropractorConsents, Records, T Results / Process Orders est []  - 0 Staff telephones HHA, Nursing Homes / Clarify orders / etc []  - 0 Routine Transfer to another Facility (non-emergent condition) []  - 0 Routine Hospital Admission (non-emergent condition) []  - 0 New Admissions / Insurance Authorizations / Ordering NPWT Apligraf, etcDarene Lamer. , Nantz, Reily [name] ([id number]) [id number] Page 2 of 7 []  - 0 Emergency Hospital Admission (emergent condition) PROCESS - Special Needs []  - 0 Pediatric / Minor Patient Management []  - 0 Isolation Patient Management []  - 0 Hearing / Language / Visual special needs []  - 0 Assessment of Community assistance (transportation, D/C planning, etc.) []  - 0 Additional assistance / Altered mentation []  - 0 Support Surface(s) Assessment (bed, cushion, seat, etc.) INTERVENTIONS - Miscellaneous []  - 0 External ear exam []  - 0 Patient Transfer (multiple staff / Nurse, adultHoyer Lift / Similar devices) []  - 0 Simple Staple / Suture removal (25 or less) []  - 0 Complex Staple / Suture removal (26 or more) []  - 0 Hypo/Hyperglycemic Management (do not check if billed  separately) []  - 0 Ankle / Brachial Index (ABI) - do not check if billed separately Has the patient been seen at the hospital within the  last three years: Yes Total Score: 0 Level Of Care: ____ Electronic Signature(s) Signed: 03/28/2023 4:28:11 PM By: Yevonne Pax RN Entered By: Yevonne Pax on 03/27/2023 08:43:53 -------------------------------------------------------------------------------- Encounter Discharge Information Details Patient Name: Date of Service: [name] [name]. 03/27/2023 8:00 A M Medical Record Number: [id number] Patient Account Number: [id number] Date of Birth/Sex: Treating RN: August 01, 1946 (77 y.o. [name] Petit) Yevonne Pax Primary Care Persia Lintner: Julieanne Manson Other Clinician: Referring Arin Vanosdol: Treating Bejamin Hackbart/Extender: Estill Bakes in Treatment: 5 Encounter Discharge Information Items Post Procedure Vitals Discharge Condition: Stable Temperature (F): 98.2 Ambulatory Status: Ambulatory Pulse (bpm): 52 Discharge Destination: Home Respiratory Rate (breaths/min): 16 Transportation: Private Auto Blood Pressure (mmHg): 142/83 Accompanied By: self Schedule Follow-up Appointment: Yes Clinical Summary of Care: Electronic Signature(s) Signed: 03/28/2023 4:28:11 PM By: Yevonne Pax RN Entered By: Yevonne Pax on 03/27/2023 08:44:33 -------------------------------------------------------------------------------- Lower Extremity Assessment Details Patient Name: Date of Service: [name] [name]. 03/27/2023 8:00 A M Medical Record Number: [id number] Patient Account Number: [id number] Date of Birth/Sex: Treating RN: 1946/06/23 (77 y.o. [name] [name] Primary Care Jamason Peckham: Julieanne Manson Other Clinician: Referring Girtha Kilgore: Treating Callahan Wild/Extender: Estill Bakes in Treatment: 5 Edema Assessment Assessed: [Left: No] [Right: No] Edema: [Left: N] [Right: o] L[LeftRAEVON, BROOM ([id number])] [Right:  [id number] Page 3 of 7] Calf Left: Right: Point of Measurement: 33 cm From Medial Instep 33.5 cm Ankle Left: Right: Point of Measurement: 10 cm From Medial Instep 22 cm Vascular Assessment Pulses: Dorsalis Pedis Palpable: [Right:Yes] Electronic Signature(s) Signed: 03/28/2023 4:28:11 PM By: Yevonne Pax RN Entered By: Yevonne Pax on 03/27/2023 08:11:13 -------------------------------------------------------------------------------- Multi Wound Chart Details Patient Name: Date of Service: [name] [name]. 03/27/2023 8:00 A M Medical Record Number: [id number] Patient Account Number: [id number] Date of Birth/Sex: Treating RN: Jan 23, 1946 (77 y.o. [name] Petit) Yevonne Pax Primary Care Quantavius Humm: Julieanne Manson Other Clinician: Referring Josephine Rudnick: Treating Friedrich Harriott/Extender: Estill Bakes in Treatment: 5 Vital Signs Height(in): Pulse(bpm): 52 Weight(lbs): Blood Pressure(mmHg): 142/83 Body Mass Index(BMI): Temperature(F): 98.2 Respiratory Rate(breaths/min): 16 [1:Photos:] [N/A:N/A] Right, Anterior Lower Leg N/A N/A Wound Location: Trauma N/A N/A Wounding Event: Abrasion N/A N/A Primary Etiology: Hypertension, Myocardial Infarction, N/A N/A Comorbid History: Peripheral Venous Disease, Received Radiation 02/11/2023 N/A N/A Date Acquired: 5 N/A N/A Weeks of Treatment: Open N/A N/A Wound Status: No N/A N/A Wound Recurrence: 1.5x0.3x0.1 N/A N/A Measurements L x [name] x D (cm) 0.353 N/A N/A A (cm) : rea 0.035 N/A N/A Volume (cm) : 98.00% N/A N/A % Reduction in Area: 98.00% N/A N/A % Reduction in Volume: Full Thickness Without Exposed N/A N/A Classification: Support Structures Medium N/A N/A Exudate Amount: Serosanguineous N/A N/A Exudate Type: red, brown N/A N/A Exudate Color: Medium (34-66%) N/A N/A Granulation Amount: Pink N/A N/A Granulation Quality: Medium (34-66%) N/A N/A Necrotic Amount: Fat Layer  (Subcutaneous Tissue): Yes N/A N/A Exposed Structures: Fascia: No Tendon: No [name] [name], [name] [name] ([id number]) [id number] Page 4 of 7 Muscle: No Joint: No Bone: No Medium (34-66%) N/A N/A Epithelialization: Treatment Notes Electronic Signature(s) Signed: 03/28/2023 4:28:11 PM By: Yevonne Pax RN Entered By: Yevonne Pax on 03/27/2023 08:11:17 -------------------------------------------------------------------------------- Multi-Disciplinary Care Plan Details Patient Name: Date of Service: [name] [name]. 03/27/2023 8:00 A M Medical Record Number: [id number] Patient Account Number: [id number] Date of Birth/Sex: Treating RN: April 03, 1946 (77 y.o. [name] [name] Primary Care Brihany Butch: Julieanne Manson Other Clinician: Referring Zarai Orsborn: Treating Claron Rosencrans/Extender: Estill Bakes in Treatment: 5 Active Inactive Abuse / Safety / Falls / Self Care Management  Nursing Diagnoses: Potential for injury related to falls Goals: Patient will not experience any injury related to falls Date Initiated: 02/19/2023 Target Resolution Date: 04/21/2023 Goal Status: Active Interventions: Assess Activities of Daily Living upon admission and as needed Assess fall risk on admission and as needed Assess: immobility, friction, shearing, incontinence upon admission and as needed Assess impairment of mobility on admission and as needed per policy Assess personal safety and home safety (as indicated) on admission and as needed Assess self care needs on admission and as needed Notes: Electronic Signature(s) Signed: 03/28/2023 4:28:11 PM By: Yevonne Pax RN Entered By: Yevonne Pax on 03/27/2023 08:11:36 -------------------------------------------------------------------------------- Pain Assessment Details Patient Name: Date of Service: [name] [name], [name] [name] CK [name]. 03/27/2023 8:00 A M Medical Record Number: [id number] Patient Account Number: [id number] Date of Birth/Sex:  Treating RN: January 25, 1946 (77 y.o. [name] [name] Primary Care Stephon Weathers: Julieanne Manson Other Clinician: Referring Nadja Lina: Treating Jameir Ake/Extender: Estill Bakes in Treatment: 5 Active Problems Location of Pain Severity and Description of Pain Patient Has Paino No Site Locations [name] [name], [name] [name] ([id number]) [id number] Page 5 of 7 Pain Management and Medication Current Pain Management: Electronic Signature(s) Signed: 03/28/2023 4:28:11 PM By: Yevonne Pax RN Entered By: Yevonne Pax on 03/27/2023 08:07:36 -------------------------------------------------------------------------------- Patient/Caregiver Education Details Patient Name: Date of Service: [name] [name]. 4/10/2024andnbsp8:00 A M Medical Record Number: [id number] Patient Account Number: [id number] Date of Birth/Gender: Treating RN: 03/10/1946 (77 y.o. [name] Petit) Yevonne Pax Primary Care Physician: Julieanne Manson Other Clinician: Referring Physician: Treating Physician/Extender: Estill Bakes in Treatment: 5 Education Assessment Education Provided To: Patient Education Topics Provided Wound/Skin Impairment: Handouts: Caring for Your Ulcer Methods: Explain/Verbal Responses: State content correctly Electronic Signature(s) Signed: 03/28/2023 4:28:11 PM By: Yevonne Pax RN Entered By: Yevonne Pax on 03/27/2023 08:11:51 -------------------------------------------------------------------------------- Wound Assessment Details Patient Name: Date of Service: [name] [name]. 03/27/2023 8:00 A M Medical Record Number: [id number] Patient Account Number: [id number] Date of Birth/Sex: Treating RN: 20-Jan-1946 (77 y.o. [name] [name] Primary Care Waldemar Siegel: Julieanne Manson Other Clinician: Referring Karlei Waldo: Treating Casi Westerfeld/Extender: Estill Bakes in Treatment: 5 Wound Status Wound Number: 1 Primary  Abrasion Etiology: Wound Location: Right, Anterior Lower Leg Wound Open Wounding Event: Trauma Status: Date Acquired: 02/11/2023 Comorbid Hypertension, Myocardial Infarction, Peripheral Venous Weeks Of Treatment: 5 [name] [name], [name] [name] ([id number]) [id number].pdf Page 6 of 7 Weeks Of Treatment: 5 History: Disease, Received Radiation Clustered Wound: No Photos Wound Measurements Length: (cm) 1.5 Width: (cm) 0.3 Depth: (cm) 0.1 Area: (cm) 0.353 Volume: (cm) 0.035 % Reduction in Area: 98% % Reduction in Volume: 98% Epithelialization: Medium (34-66%) Tunneling: No Undermining: No Wound Description Classification: Full Thickness Without Exposed Support Structures Exudate Amount: Medium Exudate Type: Serosanguineous Exudate Color: red, brown Foul Odor After Cleansing: No Slough/Fibrino Yes Wound Bed Granulation Amount: Medium (34-66%) Exposed Structure Granulation Quality: Pink Fascia Exposed: No Necrotic Amount: Medium (34-66%) Fat Layer (Subcutaneous Tissue) Exposed: Yes Necrotic Quality: Adherent Slough Tendon Exposed: No Muscle Exposed: No Joint Exposed: No Bone Exposed: No Treatment Notes Wound #1 (Lower Leg) Wound Laterality: Right, Anterior Cleanser Byram Ancillary Kit - 15 Day Supply Discharge Instruction: Use supplies as instructed; Kit contains: (15) Saline Bullets; (15) 3x3 Gauze; 15 pr Gloves Soap and Water Discharge Instruction: Gently cleanse wound with antibacterial soap, rinse and pat dry prior to dressing wounds Peri-Wound Care Topical Primary Dressing Hydrofera Blue Ready Transfer Foam, 4x5 (in/in) Discharge Instruction: Apply Hydrofera Blue Ready to wound bed as directed Secondary Dressing (BORDER) Zetuvit Plus SILICONE BORDER  Dressing 5x5 (in/in) Discharge Instruction: Please do not put silicone bordered dressings under wraps. Use non-bordered dressing only. Secured With Compression Wrap Compression  Stockings Facilities manager) Signed: 03/28/2023 4:28:11 PM By: Yevonne Pax RN Entered By: Yevonne Pax on 03/27/2023 08:10:27 [name] Lamer ([id number]) [id number] Page 7 of 7 -------------------------------------------------------------------------------- Vitals Details Patient Name: Date of Service: [name] [name], [name] [name]. 03/27/2023 8:00 A M Medical Record Number: [id number] Patient Account Number: [id number] Date of Birth/Sex: Treating RN: 11/27/1946 (77 y.o. [name] Petit) Yevonne Pax Primary Care Holle Sprick: Julieanne Manson Other Clinician: Referring Shondra Capps: Treating Chason Mciver/Extender: Estill Bakes in Treatment: 5 Vital Signs Time Taken: 08:07 Temperature (F): 98.2 Pulse (bpm): 52 Respiratory Rate (breaths/min): 16 Blood Pressure (mmHg): 142/83 Reference Range: 80 - 120 mg / dl Electronic Signature(s) Signed: 03/28/2023 4:28:11 PM By: Yevonne Pax RN Entered By: Yevonne Pax on 03/27/2023 08:07:30

## 2023-03-28 NOTE — Progress Notes (Signed)
IMRANE, GIGANTI ([id number]) [id number] Page 1 of 6 Visit Report for 03/27/2023 Chief Complaint Document Details Patient Name: Date of Service: [name] [name], [name] [name]. 03/27/2023 8:00 A M Medical Record Number: [id number] Patient Account Number: [id number] Date of Birth/Sex: Treating RN: 1946-07-29 (76 y.o. [name] [name]) [name] [name] Primary Care Provider: Julieanne [name] Other Clinician: Referring Provider: Treating Provider/Extender: [name] [name] in Treatment: 5 Information Obtained from: Patient Chief Complaint 02/20/2023. Patient is here for review of an abrasion injury on the right anterior mid tibia Electronic Signature(s) Signed: 03/27/2023 9:41:04 AM By: Geralyn Corwin DO Entered By: Geralyn Corwin on 03/27/2023 09:01:02 -------------------------------------------------------------------------------- Debridement Details Patient Name: Date of Service: [name] Ina CK [name]. 03/27/2023 8:00 A M Medical Record Number: [id number] Patient Account Number: [id number] Date of Birth/Sex: Treating RN: [birth year] (76 y.o. [name] [name]) [name] [name] Primary Care Provider: Julieanne [name] Other Clinician: Referring Provider: Treating Provider/Extender: [name] [name] in Treatment: 5 Debridement Performed for Assessment: Wound #1 Right,Anterior Lower Leg Performed By: Physician Geralyn Corwin, MD Debridement Type: Debridement Level of Consciousness (Pre-procedure): Awake and Alert Pre-procedure Verification/Time Out Yes - 08:41 Taken: Start Time: 08:41 Pain Control: Lidocaine 4% T opical Solution T Area Debrided (L x [name]): otal 1.5 (cm) x 0.3 (cm) = 0.45 (cm) Tissue and other material debrided: Non-Viable, Slough, Slough Level: Non-Viable Tissue Debridement Description: Selective/Open Wound Instrument: Curette Bleeding: Minimum End Time: 08:42 Procedural Pain: 0 Post Procedural Pain: 0 Response to Treatment: Procedure was tolerated  well Level of Consciousness (Post- Awake and Alert procedure): Post Debridement Measurements of Total Wound Length: (cm) 1.5 Width: (cm) 0.3 Depth: (cm) 0.1 Volume: (cm) 0.035 Character of Wound/Ulcer Post Debridement: Improved Post Procedure Diagnosis Same as Pre-procedure Electronic Signature(s) Signed: 03/27/2023 9:41:04 AM By: Geralyn Corwin DO Signed: 03/28/2023 4:28:11 PM By: [name] Pax RN Entered By: [name] [name] on 03/27/2023 08:43:46 [name] [name] ([id number]) [id number] Page 2 of 6 -------------------------------------------------------------------------------- HPI Details Patient Name: Date of Service: [name] [name], [name] [name]. 03/27/2023 8:00 A M Medical Record Number: [id number] Patient Account Number: [id number] Date of Birth/Sex: Treating RN: 1946/04/30 (76 y.o. [name] [name]) [name] [name] Primary Care Provider: Julieanne [name] Other Clinician: Referring Provider: Treating Provider/Extender: [name] [name] in Treatment: 5 History of Present Illness HPI Description: Admission 02/17/2022 This is a patient who fell in his room about a week and a half ago. He saw his primary doctor was felt to have cellulitis of the right leg. Noticed to have a wound of the right leg. He has been applying Neosporin since 3/1. Also was placed on Bactrim DS 4 days ago. He is not in a lot of pain. No prior wound history. He has a history of PAD, Parkinson's, B12 deficiency, prostate cancer, nonrheumatic mitral valve disease, MI 1994, B12 deficiency, history of Mohs surgery followed by dermatology and carotid artery stenosis ABI on the right leg was 0.61. 3/12; patient presents for follow-up. He has been using Hydrofera Blue to the wound bed. There is been improvement in healing. Patient has no issues or complaints today. 3/20; patient presents for follow-up. He has been using Hydrofera Blue to the wound bed. Wound is smaller today. Patient has no issues or  complaints. 3/27; patient presents for follow-up. He has been using Hydrofera Blue to the wound bed. The wound is smaller today. He has no issues or complaints today. 4/10; patient presents for follow-up. We have been using Hydrofera Blue to the wound bed. The wound is smaller today. He has some slough buildup. He  has no issues or complaints today. Electronic Signature(s) Signed: 03/27/2023 9:41:04 AM By: Geralyn CorwinHoffman, Nivek Powley DO Entered By: Geralyn CorwinHoffman, Tyjai Charbonnet on 03/27/2023 09:02:05 -------------------------------------------------------------------------------- Physical Exam Details Patient Name: Date of Service: [name] [name], [name] [name]. 03/27/2023 8:00 A M Medical Record Number: [id number] Patient Account Number: [id number] Date of Birth/Sex: Treating RN: [birth year] (76 y.o. [name] FloridaM) [name], [name] Primary Care Provider: Julieanne MansonGilbert, [name] Other Clinician: Referring Provider: Treating Provider/Extender: [name] BakesHoffman, Dyllan Kats [name], [name] Weeks in Treatment: 5 Constitutional . Cardiovascular . Psychiatric . Notes Right lower extremity: T the anterior mid tibia there is an open wound with granulation tissue, epithelization and slough. No evidence of surrounding soft tissue o infection. Electronic Signature(s) Signed: 03/27/2023 9:41:04 AM By: Geralyn CorwinHoffman, Kalsey Lull DO Entered By: Geralyn CorwinHoffman, Lakaya Tolen on 03/27/2023 09:02:26 -------------------------------------------------------------------------------- Physician Orders Details Patient Name: Date of Service: [name] [name], [name] [name]. 03/27/2023 8:00 A M Medical Record Number: [id number] Patient Account Number: [id number] Date of Birth/Sex: Treating RN: [birth year] (76 y.o. [name] FloridaM) [name], [name] Primary Care Provider: Julieanne MansonGilbert, [name] Other Clinician: Darene LamerLEWIS, [name] [name] ([id number]) [id number] Page 3 of 6 Referring Provider: Treating Provider/Extender: [name] BakesHoffman, Mikaela Hilgeman [name], [name] Weeks in Treatment: 5 Verbal / Phone Orders: No Diagnosis Coding Follow-up  Appointments Return Appointment in 1 week. Bathing/ Shower/ Hygiene May shower; gently cleanse wound with antibacterial soap, rinse and pat dry prior to dressing wounds Edema Control - Lymphedema / Segmental Compressive Device / Other Elevate, Exercise Daily and A void Standing for Long Periods of Time. Elevate legs to the level of the heart and pump ankles as often as possible Elevate leg(s) parallel to the floor when sitting. Wound Treatment Wound #1 - Lower Leg Wound Laterality: Right, Anterior Cleanser: Byram Ancillary Kit - 15 Day Supply (Generic) Every Other Day/30 Days Discharge Instructions: Use supplies as instructed; Kit contains: (15) Saline Bullets; (15) 3x3 Gauze; 15 pr Gloves Cleanser: Soap and Water Every Other Day/30 Days Discharge Instructions: Gently cleanse wound with antibacterial soap, rinse and pat dry prior to dressing wounds Prim Dressing: Hydrofera Blue Ready Transfer Foam, 4x5 (in/in) Every Other Day/30 Days ary Discharge Instructions: Apply Hydrofera Blue Ready to wound bed as directed Secondary Dressing: (BORDER) Zetuvit Plus SILICONE BORDER Dressing 5x5 (in/in) (Generic) Every Other Day/30 Days Discharge Instructions: Please do not put silicone bordered dressings under wraps. Use non-bordered dressing only. Electronic Signature(s) Signed: 03/27/2023 9:41:04 AM By: Geralyn CorwinHoffman, Hatem Cull DO Entered By: Geralyn CorwinHoffman, Rondia Higginbotham on 03/27/2023 09:03:53 -------------------------------------------------------------------------------- Problem List Details Patient Name: Date of Service: [name] [name], [name] [name]. 03/27/2023 8:00 A M Medical Record Number: [id number] Patient Account Number: [id number] Date of Birth/Sex: Treating RN: [birth year] (76 y.o. 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 03/27/2023 8:00 A M Medical Record Number: [id number] Patient Account Number: [id number] Date of Birth/Sex: Treating RN: [birth year] (76 y.o. [name] PetitM) [name] PaxEpps, [name] Primary Care Provider: Julieanne MansonGilbert, [name] Other Clinician: Referring Provider: Treating Provider/Extender: [name] BakesHoffman, Bocephus Cali [name], [name] Weeks in Treatment: 5 Subjective Chief Complaint Information obtained from Patient 02/20/2023. Patient is here  for review of an abrasion injury on the right anterior mid tibia History of Present Illness (HPI) Admission 02/17/2022 This is a patient who fell in his room about a week and a half ago. He saw his primary doctor was felt to have cellulitis of the right leg. Noticed to have a wound of the right leg. He has been applying Neosporin since 3/1. Also was placed on Bactrim DS 4 days ago. He is not in a lot of pain. No prior wound history. He has a history of PAD, Parkinson's, B12 deficiency, prostate cancer, nonrheumatic mitral valve disease, MI 1994, B12 deficiency, history of Mohs surgery followed by dermatology and carotid artery stenosis ABI on the right leg was 0.61. 3/12; patient presents for follow-up. He has been using Hydrofera Blue to the wound bed. There is been improvement in healing. Patient has no issues or complaints today. 3/20; patient presents for follow-up. He has been using Hydrofera  Blue to the wound bed. Wound is smaller today. Patient has no issues or complaints. 3/27; patient presents for follow-up. He has been using Hydrofera Blue to the wound bed. The wound is smaller today. He has no issues or complaints today. 4/10; patient presents for follow-up. We have been using Hydrofera Blue to the wound bed. The wound is smaller today. He has some slough buildup. He has no issues or complaints today. Objective Constitutional Vitals Time Taken: 8:07 AM, Temperature: 98.2 F, Pulse: 52 bpm, Respiratory Rate: 16 breaths/min, Blood Pressure: 142/83 mmHg. General Notes: Right lower extremity: T the anterior mid tibia there is an open wound with granulation tissue, epithelization and slough. No evidence of o surrounding soft tissue infection. Integumentary (Hair, Skin) Wound #1 status is Open. Original cause of wound was Trauma. The date acquired was: 02/11/2023. The wound has been in treatment 5 weeks. The wound is located on the Right,Anterior Lower Leg. The wound measures 1.5cm length x 0.3cm width x 0.1cm depth; 0.353cm^2 area and 0.035cm^3 volume. There is Fat Layer (Subcutaneous Tissue) exposed. There is no tunneling or undermining noted. There is a medium amount of serosanguineous drainage noted. There is medium (34-66%) pink granulation within the wound bed. There is a medium (34-66%) amount of necrotic tissue within the wound bed including Adherent Slough. 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 I debrided slough. I recommended continuing with Hydrofera Blue. Follow-up in 1 week. Procedures [name] [name], [name] [name] ([id number]) [id number] Page 5 of 6 Wound #1 Pre-procedure diagnosis of Wound #1 is an Abrasion located on the  Right,Anterior Lower Leg . There was a Selective/Open Wound Non-Viable Tissue Debridement with a total area of 0.45 sq cm performed by Geralyn Corwin, MD. With the following instrument(s): Curette to remove Non-Viable tissue/material. Material removed includes [facility] after achieving pain control using Lidocaine 4% Topical Solution. No specimens were taken. A time out was conducted at 08:41, prior to the start of the procedure. A Minimum amount of bleeding was controlled with N/A. The procedure was tolerated well with a pain level of 0 throughout and a pain level of 0 following the procedure. Post Debridement Measurements: 1.5cm length x 0.3cm width x 0.1cm depth; 0.035cm^3 volume. Character of Wound/Ulcer Post Debridement is improved. Post procedure Diagnosis Wound #1: Same as Pre-Procedure Plan Follow-up Appointments: Return Appointment in 1 week. Bathing/ Shower/ Hygiene: May shower; gently cleanse  wound with antibacterial soap, rinse and pat dry prior to dressing wounds Edema Control - Lymphedema / Segmental Compressive Device / Other: Elevate, Exercise Daily and Avoid Standing for Long Periods of Time. Elevate legs to the level of the heart and pump ankles as often as possible Elevate leg(s) parallel to the floor when sitting. WOUND #1: - Lower Leg Wound Laterality: Right, Anterior Cleanser: Byram Ancillary Kit - 15 Day Supply (Generic) Every Other Day/30 Days Discharge Instructions: Use supplies as instructed; Kit contains: (15) Saline Bullets; (15) 3x3 Gauze; 15 pr Gloves Cleanser: Soap and Water Every Other Day/30 Days Discharge Instructions: Gently cleanse wound with antibacterial soap, rinse and pat dry prior to dressing wounds Prim Dressing: Hydrofera Blue Ready Transfer Foam, 4x5 (in/in) Every Other Day/30 Days ary Discharge Instructions: Apply Hydrofera Blue Ready to wound bed as directed Secondary Dressing: (BORDER) Zetuvit Plus SILICONE BORDER Dressing 5x5 (in/in) (Generic)  Every Other Day/30 Days Discharge Instructions: Please do not put silicone bordered dressings under wraps. Use non-bordered dressing only. 1. In office sharp debridement 2. Hydrofera Blue 3. Follow-up in 1 week Electronic Signature(s) Signed: 03/27/2023 9:41:04 AM By: Geralyn Corwin DO Entered By: Geralyn Corwin on 03/27/2023 09:03:38 -------------------------------------------------------------------------------- ROS/PFSH Details Patient Name: Date of Service: [name] Ina CK [name]. 03/27/2023 8:00 A M Medical Record Number: [id number] Patient Account Number: [id number] Date of Birth/Sex: Treating RN: [birth year] (76 y.o. [name] [name]) [name] [name] Primary Care Provider: Julieanne [name] Other Clinician: Referring Provider: Treating Provider/Extender: [name] [name] in Treatment: 5 Cardiovascular Medical History: Positive for: Hypertension; Myocardial Infarction; Peripheral Venous Disease Oncologic Medical History: Positive for: Received Radiation - 2020 Immunizations Pneumococcal Vaccine: Received Pneumococcal Vaccination: No Implantable Devices None Family and Social History Never smoker; Marital Status - Married; Alcohol Use: Daily; Drug Use: No History; Caffeine Use: Moderate [name] [name], [name] [name] ([id number]) [phone].pdf Page 6 of 6 Electronic Signature(s) Signed: 03/27/2023 9:41:04 AM By: Geralyn Corwin DO Signed: 03/28/2023 4:28:11 PM By: [name] Pax RN Entered By: Geralyn Corwin on 03/27/2023 09:04:01 -------------------------------------------------------------------------------- SuperBill Details Patient Name: Date of Service: [name] Ina CK [name]. 03/27/2023 Medical Record Number: [id number] Patient Account Number: [id number] Date of Birth/Sex: Treating RN: 08-15-1946 (76 y.o. 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 limited to breakdown of skin Facility Procedures : CPT4 Code: [id number] Description: 97597 - DEBRIDE WOUND 1ST 20 SQ CM OR < ICD-10 Diagnosis Description L97.911 Non-pressure chronic ulcer of unspecified part of right lower leg limited to brea S80.811D Abrasion, right lower leg, subsequent encounter Modifier: kdown of skin Quantity: 1 Physician Procedures : CPT4 Code Description Modifier [id number] - WC PHYS DEBR WO ANESTH 20 SQ CM ICD-10 Diagnosis Description L97.911 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

## 2023-04-03 ENCOUNTER — Encounter (HOSPITAL_BASED_OUTPATIENT_CLINIC_OR_DEPARTMENT_OTHER): Payer: Medicare HMO | Admitting: Internal Medicine

## 2023-04-03 DIAGNOSIS — S80811A Abrasion, right lower leg, initial encounter: Secondary | ICD-10-CM | POA: Diagnosis not present

## 2023-04-03 DIAGNOSIS — L97911 Non-pressure chronic ulcer of unspecified part of right lower leg limited to breakdown of skin: Secondary | ICD-10-CM

## 2023-04-03 DIAGNOSIS — L97812 Non-pressure chronic ulcer of other part of right lower leg with fat layer exposed: Secondary | ICD-10-CM | POA: Diagnosis not present

## 2023-04-03 DIAGNOSIS — S80811D Abrasion, right lower leg, subsequent encounter: Secondary | ICD-10-CM

## 2023-04-03 DIAGNOSIS — W19XXXA Unspecified fall, initial encounter: Secondary | ICD-10-CM | POA: Diagnosis not present

## 2023-04-03 NOTE — Progress Notes (Signed)
[name], [name] ([id number]) [id number] Page 1 of 5 Visit Report for 04/03/2023 Chief Complaint Document Details Patient Name: Date of Service: [name] [name], [name] CK W. 04/03/2023 8:00 A M Medical Record Number: [id number] Patient Account Number: [id number] Date of Birth/Sex: Treating RN: [birth year] (76 y.o. Judie Petit) Yevonne Pax Primary Care Provider: Julieanne Manson Other Clinician: Referring Provider: Treating Provider/Extender: Estill Bakes in Treatment: 6 Information Obtained from: Patient Chief Complaint 02/20/2023. Patient is here for review of an abrasion injury on the right anterior mid tibia Electronic Signature(s) Signed: 04/03/2023 11:12:29 AM By: Geralyn Corwin DO Entered By: Geralyn Corwin on 04/03/2023 08:44:38 -------------------------------------------------------------------------------- HPI Details Patient Name: Date of Service: [name] Ina CK W. 04/03/2023 8:00 A M Medical Record Number: [id number] Patient Account Number: [id number] Date of Birth/Sex: Treating RN: 02/15/1946 (76 y.o. [name] [name] Primary Care Provider: Julieanne Manson Other Clinician: Referring Provider: Treating Provider/Extender: Estill Bakes in Treatment: 6 History of Present Illness HPI Description: Admission 02/17/2022 This is a patient who fell in his room about a week and a half ago. He saw his primary doctor was felt to have cellulitis of the right leg. Noticed to have a wound of the right leg. He has been applying Neosporin since 3/1. Also was placed on Bactrim DS 4 days ago. He is not in a lot of pain. No prior wound history. He has a history of PAD, Parkinson's, B12 deficiency, prostate cancer, nonrheumatic mitral valve disease, MI 1994, B12 deficiency, history of Mohs surgery followed by dermatology and carotid artery stenosis ABI on the right leg was 0.61. 3/12; patient presents for follow-up. He has been using  Hydrofera Blue to the wound bed. There is been improvement in healing. Patient has no issues or complaints today. 3/20; patient presents for follow-up. He has been using Hydrofera Blue to the wound bed. Wound is smaller today. Patient has no issues or complaints. 3/27; patient presents for follow-up. He has been using Hydrofera Blue to the wound bed. The wound is smaller today. He has no issues or complaints today. 4/10; patient presents for follow-up. We have been using Hydrofera Blue to the wound bed. The wound is smaller today. He has some slough buildup. He has no issues or complaints today. 4/17; patient presents for follow-up. He has been using Hydrofera Blue to the wound bed. The wound is smaller today. He has no issues or complaints. Electronic Signature(s) Signed: 04/03/2023 11:12:29 AM By: Geralyn Corwin DO Entered By: Geralyn Corwin on 04/03/2023 08:45:06 -------------------------------------------------------------------------------- Physical Exam Details Patient Name: Date of Service: [name] Ina CK W. 04/03/2023 8:00 A M Medical Record Number: [id number] Patient Account Number: [id number] Date of Birth/Sex: Treating RN: Nov 17, 1946 (76 y.o. [name] [name] Primary Care Provider: Julieanne Manson Other Clinician: Referring Provider: Treating Provider/Extender: Jaycen, Vercher ([id number]) [id number] Page 2 of 5 Weeks in Treatment: 6 Constitutional . Cardiovascular . Psychiatric . Notes Right lower extremity: T the anterior mid tibia there is an open wound with granulation tissue. No signs of surrounding infection. o Electronic Signature(s) Signed: 04/03/2023 11:12:29 AM By: Geralyn Corwin DO Entered By: Geralyn Corwin on 04/03/2023 08:46:21 -------------------------------------------------------------------------------- Physician Orders Details Patient Name: Date of Service: [name] Ina CK W. 04/03/2023 8:00 A  M Medical Record Number: [id number] Patient Account Number: [id number] Date of Birth/Sex: Treating RN: 01-03-1946 (76 y.o. [name] [name] Primary Care Provider: Julieanne Manson Other Clinician: Referring Provider: Treating Provider/Extender: Estill Bakes in Treatment: 6 Verbal / Phone Orders: No  Diagnosis Coding Follow-up Appointments Return Appointment in 2 weeks. Bathing/ Shower/ Hygiene May shower; gently cleanse wound with antibacterial soap, rinse and pat dry prior to dressing wounds Edema Control - Lymphedema / Segmental Compressive Device / Other Elevate, Exercise Daily and A void Standing for Long Periods of Time. Elevate legs to the level of the heart and pump ankles as often as possible Elevate leg(s) parallel to the floor when sitting. Wound Treatment Wound #1 - Lower Leg Wound Laterality: Right, Anterior Cleanser: Soap and Water 1 x Per Day/30 Days Discharge Instructions: Gently cleanse wound with antibacterial soap, rinse and pat dry prior to dressing wounds Topical: Triple Antibiotic Ointment, 1 (oz) Tube 1 x Per Day/30 Days Secondary Dressing: Coverlet Latex-Free Fabric Adhesive Dressings 1 x Per Day/30 Days Discharge Instructions: 1.5 x 2 Electronic Signature(s) Signed: 04/03/2023 11:12:29 AM By: Geralyn Corwin DO Entered By: Geralyn Corwin on 04/03/2023 08:48:10 -------------------------------------------------------------------------------- Problem List Details Patient Name: Date of Service: [name] Ina CK W. 04/03/2023 8:00 A M Medical Record Number: [id number] Patient Account Number: [id number] Date of Birth/Sex: Treating RN: 09/13/1946 (76 y.o. 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 04/03/2023 8:00 A M Medical Record Number: [id number] Patient Account Number: [id number] Date of Birth/Sex: Treating RN: 01/31/1946 (76 y.o. [name] [name] Primary Care Provider: Julieanne Manson Other Clinician: Referring Provider: Treating Provider/Extender: Estill Bakes in Treatment: 6 Subjective Chief Complaint Information obtained from Patient 02/20/2023. Patient is here for review of an abrasion injury on the right anterior mid tibia History of Present Illness (HPI) Admission 02/17/2022 This is a patient who fell in his room about a week and a half ago. He saw his primary doctor was felt to have cellulitis of the right leg. Noticed to have a wound of the right leg. He has been applying Neosporin since 3/1. Also was placed on Bactrim DS 4 days ago. He is not in a lot of pain. No prior wound history. He has a history of PAD, Parkinson's, B12 deficiency, prostate cancer, nonrheumatic mitral valve disease, MI 1994, B12 deficiency, history of Mohs surgery followed by dermatology and carotid artery stenosis ABI on the right leg was 0.61. 3/12; patient presents for follow-up. He has been using Hydrofera Blue to the wound bed. There is been improvement in healing. Patient has no issues or complaints today. 3/20; patient presents for follow-up. He has been using Hydrofera Blue to the wound bed. Wound  is smaller today. Patient has no issues or complaints. 3/27; patient presents for follow-up. He has been using Hydrofera Blue to the wound bed. The wound is smaller today. He has no issues or complaints today. 4/10; patient presents for follow-up. We have been using Hydrofera Blue to the wound bed. The wound is smaller today. He has some slough buildup. He has no issues or complaints today. 4/17; patient presents for follow-up. He has been using Hydrofera Blue to the wound bed. The wound is  smaller today. He has no issues or complaints. Objective Constitutional Vitals Time Taken: 8:19 AM, Temperature: 97.6+ F, Pulse: 55 bpm, Respiratory Rate: 16 breaths/min, Blood Pressure: 152/88 mmHg. General Notes: Right lower extremity: T the anterior mid tibia there is an open wound with granulation tissue. No signs of surrounding infection. DARRILL, LAZO ([id number]) [id number] Page 4 of 5 Integumentary (Hair, Skin) Wound #1 status is Open. Original cause of wound was Trauma. The date acquired was: 02/11/2023. The wound has been in treatment 6 weeks. The wound is located on the Right,Anterior Lower Leg. The wound measures 0.3cm length x 0.3cm width x 0.1cm depth; 0.071cm^2 area and 0.007cm^3 volume. There is Fat Layer (Subcutaneous Tissue) exposed. There is no tunneling or undermining noted. There is a medium amount of serosanguineous drainage noted. There is medium (34-66%) pink granulation within the wound bed. There is a medium (34-66%) amount of necrotic tissue within the wound bed including Adherent Slough. 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 The wound is too small for dressing. I recommended antibiotic ointment and keeping the area covered daily.  Follow-up in 2 weeks. Wound should be healed by then. Plan Follow-up Appointments: Return Appointment in 2 weeks. Bathing/ Shower/ Hygiene: May shower; gently cleanse wound with antibacterial soap, rinse and pat dry prior to dressing wounds Edema Control - Lymphedema / Segmental Compressive Device / Other: Elevate, Exercise Daily and Avoid Standing for Long Periods of Time. Elevate legs to the level of the heart and pump ankles as often as possible Elevate leg(s) parallel to the floor when sitting. WOUND #1: - Lower Leg Wound Laterality: Right, Anterior Cleanser: Soap and Water 1 x Per Day/30 Days Discharge Instructions: Gently cleanse wound with antibacterial soap, rinse and pat dry prior to dressing wounds Topical: Triple Antibiotic Ointment, 1 (oz) Tube 1 x Per Day/30 Days Secondary Dressing: Coverlet Latex-Free Fabric Adhesive Dressings 1 x Per Day/30 Days Discharge Instructions: 1.5 x 2 1. Antibiotic ointment 2. Follow-up in 2 weeks Electronic Signature(s) Signed: 04/03/2023 11:12:29 AM By: Geralyn Corwin DO Entered By: Geralyn Corwin on 04/03/2023 08:47:38 -------------------------------------------------------------------------------- SuperBill Details Patient Name: Date of Service: [name] Ina CK W. 04/03/2023 Medical Record Number: [id number] Patient Account Number: [id number] Date of Birth/Sex: Treating RN: [birth year] (76 y.o. 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 limited to breakdown of skin Facility Procedures : CPT4 Code: 62703500 Description: 93818 - WOUND CARE VISIT-LEV 2 EST PT Modifier: Quantity: 1 Physician Procedures DENVER, KIBBEY ([id number]):  CPT4 Code Description [id number] 99213 - WC PHYS LEVEL 3 - EST PT ICD-10 Diagnosis Description S80.811D Abrasion, right lower leg, subsequent encounter L97.911 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

## 2023-04-04 DIAGNOSIS — M79671 Pain in right foot: Secondary | ICD-10-CM | POA: Diagnosis not present

## 2023-04-04 DIAGNOSIS — I10 Essential (primary) hypertension: Secondary | ICD-10-CM | POA: Diagnosis not present

## 2023-04-04 DIAGNOSIS — Z8546 Personal history of malignant neoplasm of prostate: Secondary | ICD-10-CM | POA: Diagnosis not present

## 2023-04-04 DIAGNOSIS — M79672 Pain in left foot: Secondary | ICD-10-CM | POA: Diagnosis not present

## 2023-04-04 DIAGNOSIS — I2510 Atherosclerotic heart disease of native coronary artery without angina pectoris: Secondary | ICD-10-CM | POA: Diagnosis not present

## 2023-04-04 DIAGNOSIS — G20B1 Parkinson's disease with dyskinesia, without mention of fluctuations: Secondary | ICD-10-CM | POA: Diagnosis not present

## 2023-04-04 NOTE — Progress Notes (Signed)
LORCAN, SHELP ([id number]) [id number] Page 1 of 7 Visit Report for 04/03/2023 Arrival Information Details Patient Name: Date of Service: [name] [name], [name] CK W. 04/03/2023 8:00 A M Medical Record Number: [id number] Patient Account Number: [id number] Date of Birth/Sex: Treating RN: 1946-06-03 (77 y.o. Judie Petit) Yevonne Pax Primary Care Bhavika Schnider: Julieanne Manson Other Clinician: Referring Jakyrie Totherow: Treating Canyon Lohr/Extender: Estill Bakes in Treatment: 6 Visit Information History Since Last Visit Added or deleted any medications: No Patient Arrived: Ambulatory Any new allergies or adverse reactions: No Arrival Time: 08:16 Had a fall or experienced change in No Accompanied By: self activities of daily living that may affect Transfer Assistance: None risk of falls: Patient Identification Verified: Yes Signs or symptoms of abuse/neglect since last visito No Secondary Verification Process Completed: Yes Hospitalized since last visit: No Patient Requires Transmission-Based Precautions: No Implantable device outside of the clinic excluding No Patient Has Alerts: No cellular tissue based products placed in the center since last visit: Has Dressing in Place as Prescribed: Yes Pain Present Now: No Electronic Signature(s) Signed: 04/03/2023 4:41:51 PM By: Yevonne Pax RN Entered By: Yevonne Pax on 04/03/2023 08:19:46 -------------------------------------------------------------------------------- Clinic Level of Care Assessment Details Patient Name: Date of Service: [name] [name], INCLAN. 04/03/2023 8:00 A M Medical Record Number: [id number] Patient Account Number: [id number] Date of Birth/Sex: Treating RN: 10-04-1946 (77 y.o. Judie Petit) Yevonne Pax Primary Care Nieve Rojero: Julieanne Manson Other Clinician: Referring Mikiyah Glasner: Treating Porcha Deblanc/Extender: Estill Bakes in Treatment: 6 Clinic Level of Care Assessment Items TOOL 4 Quantity  Score X- 1 0 Use when only an EandM is performed on FOLLOW-UP visit ASSESSMENTS - Nursing Assessment / Reassessment X- 1 10 Reassessment of Co-morbidities (includes updates in patient status) X- 1 5 Reassessment of Adherence to Treatment Plan ASSESSMENTS - Wound and Skin A ssessment / Reassessment X - Simple Wound Assessment / Reassessment - one wound 1 5 []  - 0 Complex Wound Assessment / Reassessment - multiple wounds []  - 0 Dermatologic / Skin Assessment (not related to wound area) ASSESSMENTS - Focused Assessment []  - 0 Circumferential Edema Measurements - multi extremities []  - 0 Nutritional Assessment / Counseling / Intervention []  - 0 Lower Extremity Assessment (monofilament, tuning fork, pulses) []  - 0 Peripheral Arterial Disease Assessment (using hand held doppler) ASSESSMENTS - Ostomy and/or Continence Assessment and Care []  - 0 Incontinence Assessment and Management []  - 0 Ostomy Care Assessment and Management (repouching, etc.) PROCESS - Coordination of Care X - Simple Patient / Family Education for ongoing care 1 [address] ([id number]) [phone].pdf Page 2 of 7 []  - 0 Complex (extensive) Patient / Family Education for ongoing care []  - 0 Staff obtains Chiropractor, Records, T Results / Process Orders est []  - 0 Staff telephones HHA, Nursing Homes / Clarify orders / etc []  - 0 Routine Transfer to another Facility (non-emergent condition) []  - 0 Routine Hospital Admission (non-emergent condition) []  - 0 New Admissions / Manufacturing engineer / Ordering NPWT Apligraf, etc. , []  - 0 Emergency Hospital Admission (emergent condition) X- 1 10 Simple Discharge Coordination []  - 0 Complex (extensive) Discharge Coordination PROCESS - Special Needs []  - 0 Pediatric / Minor Patient Management []  - 0 Isolation Patient Management []  - 0 Hearing / Language / Visual special needs []  - 0 Assessment of Community assistance  (transportation, D/C planning, etc.) []  - 0 Additional assistance / Altered mentation []  - 0 Support Surface(s) Assessment (bed, cushion, seat, etc.) INTERVENTIONS - Wound Cleansing / Measurement X - Simple Wound  Cleansing - one wound 1 5  - 0 Complex Wound Cleansing - multiple wounds X- 1 5 Wound Imaging (photographs - any number of wounds)  - 0 Wound Tracing (instead of photographs) X- 1 5 Simple Wound Measurement - one wound  - 0 Complex Wound Measurement - multiple wounds INTERVENTIONS - Wound Dressings X - Small Wound Dressing one or multiple wounds 1 10  - 0 Medium Wound Dressing one or multiple wounds  - 0 Large Wound Dressing one or multiple wounds  - 0 Application of Medications - topical  - 0 Application of Medications - injection INTERVENTIONS - Miscellaneous  - 0 External ear exam  - 0 Specimen Collection (cultures, biopsies, blood, body fluids, etc.)  - 0 Specimen(s) / Culture(s) sent or taken to Lab for analysis  - 0 Patient Transfer (multiple staff / Nurse, adult / Similar devices)  - 0 Simple Staple / Suture removal (25 or less)  - 0 Complex Staple / Suture removal (26 or more)  - 0 Hypo / Hyperglycemic Management (close monitor of Blood Glucose)  - 0 Ankle / Brachial Index (ABI) - do not check if billed separately X- 1 5 Vital Signs Has the patient been seen at the hospital within the last three years: Yes Total Score: 75 Level Of Care: New/Established - Level 2 Electronic Signature(s) Signed: 04/03/2023 4:41:51 PM By: Yevonne Pax RN Entered By: Yevonne Pax on 04/03/2023 08:42:16 [name] ([id number]) [id number] Page 3 of 7 -------------------------------------------------------------------------------- Encounter Discharge Information Details Patient Name: Date of Service: [name] [name], LALL. 04/03/2023 8:00 A M Medical Record Number: [id number] Patient Account Number: [id number] Date of  Birth/Sex: Treating RN: 31-Jul-1946 (77 y.o. Judie Petit) Yevonne Pax Primary Care Liberty Stead: Julieanne Manson Other Clinician: Referring Azzam Mehra: Treating Dessiree Sze/Extender: Estill Bakes in Treatment: 6 Encounter Discharge Information Items Discharge Condition: Stable Ambulatory Status: Ambulatory Discharge Destination: Home Transportation: Private Auto Accompanied By: self Schedule Follow-up Appointment: Yes Clinical Summary of Care: Electronic Signature(s) Signed: 04/03/2023 8:46:34 AM By: Yevonne Pax RN Entered By: Yevonne Pax on 04/03/2023 08:46:34 -------------------------------------------------------------------------------- Lower Extremity Assessment Details Patient Name: Date of Service: [name] [name], [name] CK W. 04/03/2023 8:00 A M Medical Record Number: [id number] Patient Account Number: [id number] Date of Birth/Sex: Treating RN: 1946/07/05 (76 y.o. Judie Petit) Yevonne Pax Primary Care Jadis Mika: Julieanne Manson Other Clinician: Referring Jasten Guyette: Treating Maddisen Vought/Extender: Estill Bakes in Treatment: 6 Edema Assessment Assessed: [Left: No] [Right: No] [Left: Edema] [Right: :] Calf Left: Right: Point of Measurement: 33 cm From Medial Instep 33 cm Ankle Left: Right: Point of Measurement: 10 cm From Medial Instep 21 cm Vascular Assessment Pulses: Dorsalis Pedis Palpable: [Right:Yes] Electronic Signature(s) Signed: 04/03/2023 4:41:51 PM By: Yevonne Pax RN Entered By: Yevonne Pax on 04/03/2023 08:24:00 -------------------------------------------------------------------------------- Multi Wound Chart Details Patient Name: Date of Service: [name] Ina CK W. 04/03/2023 8:00 A M Medical Record Number: [id number] Patient Account Number: [id number] Date of Birth/Sex: Treating RN: 1946/03/06 (76 y.o. [name] Primary Care Camree Wigington: Julieanne Manson Other Clinician: Referring Angelis Gates: Treating Savi Lastinger/Extender: Estill Bakes in Treatment: [address] ([id number]) [id number] Page 4 of 7 Vital Signs Height(in): Pulse(bpm): 55 Weight(lbs): Blood Pressure(mmHg): 152/88 Body Mass Index(BMI): Temperature(F): 97.6+ Respiratory Rate(breaths/min): 16 [1:Photos:] [N/A:N/A] Right, Anterior Lower Leg N/A N/A Wound Location: Trauma N/A N/A Wounding Event: Abrasion N/A N/A Primary Etiology: Hypertension, Myocardial Infarction, N/A N/A Comorbid History: Peripheral Venous Disease, Received Radiation 02/11/2023 N/A N/A Date Acquired: 6 N/A N/A Weeks of Treatment: Open N/A N/A  Wound Status: No N/A N/A Wound Recurrence: 0.3x0.3x0.1 N/A N/A Measurements L x W x D (cm) 0.071 N/A N/A A (cm) : rea 0.007 N/A N/A Volume (cm) : 99.60% N/A N/A % Reduction in Area: 99.60% N/A N/A % Reduction in Volume: Full Thickness Without Exposed N/A N/A Classification: Support Structures Medium N/A N/A Exudate Amount: Serosanguineous N/A N/A Exudate Type: red, brown N/A N/A Exudate Color: Medium (34-66%) N/A N/A Granulation Amount: Pink N/A N/A Granulation Quality: Medium (34-66%) N/A N/A Necrotic Amount: Fat Layer (Subcutaneous Tissue): Yes N/A N/A Exposed Structures: Fascia: No Tendon: No Muscle: No Joint: No Bone: No Medium (34-66%) N/A N/A Epithelialization: Treatment Notes Electronic Signature(s) Signed: 04/03/2023 4:41:51 PM By: Yevonne Pax RN Entered By: Yevonne Pax on 04/03/2023 08:24:06 -------------------------------------------------------------------------------- Multi-Disciplinary Care Plan Details Patient Name: Date of Service: [name] Ina CK W. 04/03/2023 8:00 A M Medical Record Number: [id number] Patient Account Number: [id number] Date of Birth/Sex: Treating RN: 19-Nov-1946 (76 y.o. [name] Primary Care Delilah Mulgrew: Julieanne Manson Other Clinician: Referring Amahia Madonia: Treating Filemon Breton/Extender: Estill Bakes in Treatment: 6 Active Inactive Abuse / Safety / Falls / Self Care Management Nursing Diagnoses: Potential for injury related to falls [name] [name], [name] [name] ([id number]) [phone].pdf Page 5 of 7 Goals: Patient will not experience any injury related to falls Date Initiated: 02/19/2023 Target Resolution Date: 04/21/2023 Goal Status: Active Interventions: Assess Activities of Daily Living upon admission and as needed Assess fall risk on admission and as needed Assess: immobility, friction, shearing, incontinence upon admission and as needed Assess impairment of mobility on admission and as needed per policy Assess personal safety and home safety (as indicated) on admission and as needed Assess self care needs on admission and as needed Notes: Electronic Signature(s) Signed: 04/03/2023 4:41:51 PM By: Yevonne Pax RN Entered By: Yevonne Pax on 04/03/2023 08:24:19 -------------------------------------------------------------------------------- Pain Assessment Details Patient Name: Date of Service: [name] Ina CK W. 04/03/2023 8:00 A M Medical Record Number: [id number] Patient Account Number: [id number] Date of Birth/Sex: Treating RN: [birth year] (76 y.o. Judie Petit) Yevonne Pax Primary Care Glyn Zendejas: Julieanne Manson Other Clinician: Referring Akua Blethen: Treating Jahmil Macleod/Extender: Estill Bakes in Treatment: 6 Active Problems Location of Pain Severity and Description of Pain Patient Has Paino No Site Locations Pain Management and Medication Current Pain Management: Electronic Signature(s) Signed: 04/03/2023 4:41:51 PM By: Yevonne Pax RN Entered By: Yevonne Pax on 04/03/2023 08:20:19 -------------------------------------------------------------------------------- Patient/Caregiver Education Details Patient Name: Date of Service: [name] [name], [name] CK W. 4/17/2024andnbsp8:00 A M Medical Record Number: [id number] Patient Account  Number: [id number] Date of Birth/Gender: Treating RN: 1946/11/20 (76 y.o. [name] Primary Care Physician: Julieanne Manson Other Clinician: Referring Physician: Treating Physician/Extender: Estill Bakes in Treatment: [address] Rueben Bash ([id number]) [id number] Page 6 of 7 Education Assessment Education Provided To: Patient Education Topics Provided Wound/Skin Impairment: Handouts: Caring for Your Ulcer Methods: Printed Responses: State content correctly Electronic Signature(s) Signed: 04/03/2023 4:41:51 PM By: Yevonne Pax RN Entered By: Yevonne Pax on 04/03/2023 08:24:33 -------------------------------------------------------------------------------- Wound Assessment Details Patient Name: Date of Service: [name] Ina CK W. 04/03/2023 8:00 A M Medical Record Number: [id number] Patient Account Number: [id number] Date of Birth/Sex: Treating RN: 1946-07-04 (76 y.o. Judie Petit) Yevonne Pax Primary Care Marshon Bangs: Julieanne Manson Other Clinician: Referring Ido Wollman: Treating Leea Rambeau/Extender: Estill Bakes in Treatment: 6 Wound Status Wound Number: 1 Primary Abrasion Etiology: Wound Location: Right, Anterior Lower Leg Wound Open Wounding Event: Trauma Status: Date Acquired: 02/11/2023 Comorbid Hypertension, Myocardial Infarction, Peripheral Venous Weeks Of Treatment: 6  History: Disease, Received Radiation Clustered Wound: No Photos Wound Measurements Length: (cm) 0.3 Width: (cm) 0.3 Depth: (cm) 0.1 Area: (cm) 0.071 Volume: (cm) 0.007 % Reduction in Area: 99.6% % Reduction in Volume: 99.6% Epithelialization: Medium (34-66%) Tunneling: No Undermining: No Wound Description Classification: Full Thickness Without Exposed Suppor Exudate Amount: Medium Exudate Type: Serosanguineous Exudate Color: red, brown t Structures Foul Odor After Cleansing: No Slough/Fibrino Yes Wound Bed Granulation  Amount: Medium (34-66%) Exposed Structure Granulation Quality: Pink Fascia Exposed: No Necrotic Amount: Medium (34-66%) Fat Layer (Subcutaneous Tissue) Exposed: Yes Necrotic Quality: Adherent Slough Tendon Exposed: No Muscle Exposed: No Joint Exposed: No [name] [name], [name] [name] ([id number]) [id number] Page 7 of 7 Bone Exposed: No Treatment Notes Wound #1 (Lower Leg) Wound Laterality: Right, Anterior Cleanser Soap and Water Discharge Instruction: Gently cleanse wound with antibacterial soap, rinse and pat dry prior to dressing wounds Peri-Wound Care Topical Triple Antibiotic Ointment, 1 (oz) Tube Primary Dressing Secondary Dressing Coverlet Latex-Free Fabric Adhesive Dressings Discharge Instruction: 1.5 x 2 Secured With Compression Wrap Compression Stockings Add-Ons Electronic Signature(s) Signed: 04/03/2023 4:41:51 PM By: Yevonne Pax RN Entered By: Yevonne Pax on 04/03/2023 08:22:37 -------------------------------------------------------------------------------- Vitals Details Patient Name: Date of Service: [name] Ina CK W. 04/03/2023 8:00 A M Medical Record Number: [id number] Patient Account Number: [id number] Date of Birth/Sex: Treating RN: December 10, 1946 (76 y.o. Judie Petit) Yevonne Pax Primary Care Torryn Fiske: Julieanne Manson Other Clinician: Referring Reymundo Winship: Treating Naquan Garman/Extender: Estill Bakes in Treatment: 6 Vital Signs Time Taken: 08:19 Temperature (F): 97.6+ Pulse (bpm): 55 Respiratory Rate (breaths/min): 16 Blood Pressure (mmHg): 152/88 Reference Range: 80 - 120 mg / dl Electronic Signature(s) Signed: 04/03/2023 4:41:51 PM By: Yevonne Pax RN Entered By: Yevonne Pax on 04/03/2023 08:20:07

## 2023-04-08 DIAGNOSIS — B351 Tinea unguium: Secondary | ICD-10-CM | POA: Diagnosis not present

## 2023-04-08 DIAGNOSIS — M778 Other enthesopathies, not elsewhere classified: Secondary | ICD-10-CM | POA: Diagnosis not present

## 2023-04-08 DIAGNOSIS — M2042 Other hammer toe(s) (acquired), left foot: Secondary | ICD-10-CM | POA: Diagnosis not present

## 2023-04-08 DIAGNOSIS — M2041 Other hammer toe(s) (acquired), right foot: Secondary | ICD-10-CM | POA: Diagnosis not present

## 2023-04-08 DIAGNOSIS — L600 Ingrowing nail: Secondary | ICD-10-CM | POA: Diagnosis not present

## 2023-04-10 ENCOUNTER — Ambulatory Visit: Payer: Medicare HMO | Admitting: Internal Medicine

## 2023-04-17 ENCOUNTER — Encounter: Payer: Medicare HMO | Attending: Internal Medicine | Admitting: Internal Medicine

## 2023-04-17 DIAGNOSIS — S80811D Abrasion, right lower leg, subsequent encounter: Secondary | ICD-10-CM | POA: Insufficient documentation

## 2023-04-17 DIAGNOSIS — I739 Peripheral vascular disease, unspecified: Secondary | ICD-10-CM | POA: Insufficient documentation

## 2023-04-17 DIAGNOSIS — I340 Nonrheumatic mitral (valve) insufficiency: Secondary | ICD-10-CM | POA: Diagnosis not present

## 2023-04-17 DIAGNOSIS — I252 Old myocardial infarction: Secondary | ICD-10-CM | POA: Diagnosis not present

## 2023-04-17 DIAGNOSIS — E538 Deficiency of other specified B group vitamins: Secondary | ICD-10-CM | POA: Insufficient documentation

## 2023-04-17 DIAGNOSIS — L97911 Non-pressure chronic ulcer of unspecified part of right lower leg limited to breakdown of skin: Secondary | ICD-10-CM | POA: Diagnosis not present

## 2023-04-17 DIAGNOSIS — Z8546 Personal history of malignant neoplasm of prostate: Secondary | ICD-10-CM | POA: Insufficient documentation

## 2023-04-17 DIAGNOSIS — G20A1 Parkinson's disease without dyskinesia, without mention of fluctuations: Secondary | ICD-10-CM | POA: Diagnosis not present

## 2023-04-17 DIAGNOSIS — W19XXXD Unspecified fall, subsequent encounter: Secondary | ICD-10-CM | POA: Diagnosis not present

## 2023-04-30 DIAGNOSIS — G20B2 Parkinson's disease with dyskinesia, with fluctuations: Secondary | ICD-10-CM | POA: Diagnosis not present

## 2023-05-20 DIAGNOSIS — C61 Malignant neoplasm of prostate: Secondary | ICD-10-CM | POA: Diagnosis not present

## 2023-05-27 DIAGNOSIS — C61 Malignant neoplasm of prostate: Secondary | ICD-10-CM | POA: Diagnosis not present

## 2023-05-27 DIAGNOSIS — R3915 Urgency of urination: Secondary | ICD-10-CM | POA: Diagnosis not present

## 2023-06-03 NOTE — Progress Notes (Signed)
AAMARI, BENDANA ([id number]) [id number] Page 1 of 6 Visit Report for 04/17/2023 Chief Complaint Document Details Patient Name: Date of Service: [name] [name], [name] [name]. 04/17/2023 10:15 A M Medical Record Number: [id number] Patient Account Number: [id number] Date of Birth/Sex: Treating RN: 01-Jul-1946 (76 y.o. [name] [name]) [name] [name] Primary Care Provider: Julieanne Manson Other Clinician: Referring Provider: Treating Provider/Extender: Estill Bakes in Treatment: 8 Information Obtained from: Patient Chief Complaint 02/20/2023. Patient is here for review of an abrasion injury on the right anterior mid tibia Electronic Signature(s) Signed: 04/17/2023 12:46:15 PM By: Geralyn Corwin DO Entered By: Geralyn Corwin on 04/17/2023 10:29:47 -------------------------------------------------------------------------------- HPI Details Patient Name: Date of Service: [name] Ina CK W. 04/17/2023 10:15 A M Medical Record Number: [id number] Patient Account Number: [id number] Date of Birth/Sex: Treating RN: 10-24-1946 (76 y.o. [name] [name] Primary Care Provider: Julieanne Manson Other Clinician: Referring Provider: Treating Provider/Extender: Estill Bakes in Treatment: 8 History of Present Illness HPI Description: Admission 02/17/2022 This is a patient who fell in his room about a week and a half ago. He saw his primary doctor was felt to have cellulitis of the right leg. Noticed to have a wound of the right leg. He has been applying Neosporin since 3/1. Also was placed on Bactrim DS 4 days ago. He is not in a lot of pain. No prior wound history. He has a history of PAD, Parkinson's, B12 deficiency, prostate cancer, nonrheumatic mitral valve disease, MI 1994, B12 deficiency, history of Mohs surgery followed by dermatology and carotid artery stenosis ABI on the right leg was 0.61. 3/12; patient presents for follow-up. He has been using Hydrofera  Blue to the wound bed. There is been improvement in healing. Patient has no issues or complaints today. 3/20; patient presents for follow-up. He has been using Hydrofera Blue to the wound bed. Wound is smaller today. Patient has no issues or complaints. 3/27; patient presents for follow-up. He has been using Hydrofera Blue to the wound bed. The wound is smaller today. He has no issues or complaints today. [name] [name], [name] [name] ([id number]) [id number] Page 2 of 6 4/10; patient presents for follow-up. We have been using Hydrofera Blue to the wound bed. The wound is smaller today. He has some slough buildup. He has no issues or complaints today. 4/17; patient presents for follow-up. He has been using Hydrofera Blue to the wound bed. The wound is smaller today. He has no issues or complaints. 5/1; patient presents for follow-up. His wound is healed. Electronic Signature(s) Signed: 04/17/2023 12:46:15 PM By: Geralyn Corwin DO Entered By: Geralyn Corwin on 04/17/2023 10:30:03 -------------------------------------------------------------------------------- Physical Exam Details Patient Name: Date of Service: [name] Ina CK W. 04/17/2023 10:15 A M Medical Record Number: [id number] Patient Account Number: [id number] Date of Birth/Sex: Treating RN: 1946-10-02 (76 y.o. [name] [name] Primary Care Provider: Julieanne Manson Other Clinician: Referring Provider: Treating Provider/Extender: Estill Bakes in Treatment: 8 Constitutional . Cardiovascular . Psychiatric . Notes Right lower extremity: Epithelization to the previous wound site. Electronic Signature(s) Signed: 04/17/2023 12:46:15 PM By: Geralyn Corwin DO Entered By: Geralyn Corwin on 04/17/2023 10:30:22 -------------------------------------------------------------------------------- Physician Orders Details Patient Name: Date of Service: [name] Ina CK W. 04/17/2023 10:15 A M Medical Record  Number: [id number] Patient Account Number: [id number] Date of Birth/Sex: Treating RN: [birth year] (76 y.o. [name] [name] Primary Care Provider: Julieanne Manson Other Clinician: Referring Provider: Treating Provider/Extender: Estill Bakes in Treatment: 8 Verbal / Phone Orders: No [name] [name] ([id number])  [id number] Page 3 of 6 Diagnosis Coding Discharge From [facility] Services Discharge from [facility] Treatment Complete Electronic Signature(s) Signed: 04/17/2023 12:46:15 PM By: Geralyn Corwin DO Entered By: Geralyn Corwin on 04/17/2023 10:32:05 -------------------------------------------------------------------------------- Problem List Details Patient Name: Date of Service: [name] Ina CK W. 04/17/2023 10:15 A M Medical Record Number: [id number] Patient Account Number: [id number] Date of Birth/Sex: Treating RN: 1946-05-31 (76 y.o. 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 04/17/2023 10:15 A M Medical Record Number: [id number] Patient Account Number: [id number] Date of Birth/Sex: Treating RN: March 20, 1946 (76 y.o. [name] [name]) [name] [name], [name] [name] ([id number]) [id number] Page 4 of 6 Primary  Care Provider: Julieanne Manson Other Clinician: Referring Provider: Treating Provider/Extender: Estill Bakes in Treatment: 8 Subjective Chief Complaint Information obtained from Patient 02/20/2023. Patient is here for review of an abrasion injury on the right anterior mid tibia History of Present Illness (HPI) Admission 02/17/2022 This is a patient who fell in his room about a week and a half ago. He saw his primary doctor was felt to have cellulitis of the right leg. Noticed to have a wound of the right leg. He has been applying Neosporin since 3/1. Also was placed on Bactrim DS 4 days ago. He is not in a lot of pain. No prior wound history. He has a history of PAD, Parkinson's, B12 deficiency, prostate cancer, nonrheumatic mitral valve disease, MI 1994, B12 deficiency, history of Mohs surgery followed by dermatology and carotid artery stenosis ABI on the right leg was 0.61. 3/12; patient presents for follow-up. He has been using Hydrofera Blue to the wound bed. There is been improvement in healing. Patient has no issues or complaints today. 3/20; patient presents for follow-up. He has been using Hydrofera Blue to the wound bed. Wound is smaller today. Patient has no issues or complaints. 3/27; patient presents for follow-up. He has been using Hydrofera Blue to the wound bed. The wound is smaller today. He has no issues or complaints today. 4/10; patient presents for follow-up. We have been using Hydrofera Blue to the wound bed. The wound is smaller today. He has some slough buildup. He has no issues or complaints today. 4/17; patient presents for follow-up. He has been using Hydrofera Blue to the wound bed. The wound is smaller today. He has no issues or complaints. 5/1; patient presents for follow-up. His wound is healed. Objective Constitutional Vitals Time Taken: 10:23 AM, Temperature: 97.8 F, Pulse: 60 bpm, Respiratory Rate: 18 breaths/min, Blood Pressure:  145/62 mmHg. General Notes: Right lower extremity: Epithelization to the previous wound site. Integumentary (Hair, Skin) Wound #1 status is Open. Original cause of wound was Trauma. The date acquired was: 02/11/2023. The wound has been in treatment 8 weeks. The wound is located on the Right,Anterior Lower Leg. The wound measures 0cm length x 0cm width x 0cm depth; 0cm^2 area and 0cm^3 volume. There is no tunneling or undermining noted. There is a none present amount of drainage noted. There is no  granulation within the wound bed. There is no necrotic tissue within the wound bed. 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 His wound is healed. He may follow-up as needed. Plan Discharge From [facility] Services: Discharge from [facility] Treatment Complete ELISE, LORIS ([id number]) [id number] Page 5 of 6 1. Discharge from clinic due to closed wound 2. Follow-up as needed Electronic Signature(s) Signed: 04/17/2023 12:46:15 PM By: Geralyn Corwin DO Entered By: Geralyn Corwin on 04/17/2023 10:31:46 -------------------------------------------------------------------------------- ROS/PFSH Details Patient Name: Date of Service: [name] Ina CK W. 04/17/2023 10:15 A M Medical Record Number: [id number] Patient Account Number: [id number] Date of Birth/Sex: Treating RN: 17-May-1946 (76 y.o. [name] [name]) [name] [name] Primary Care Provider: Julieanne Manson Other Clinician: Referring Provider: Treating Provider/Extender: Estill Bakes in Treatment: 8 Cardiovascular Medical History: Positive for: Hypertension; Myocardial Infarction; Peripheral Venous Disease Oncologic Medical History: Positive for: Received Radiation - 2020 Immunizations Pneumococcal Vaccine: Received  Pneumococcal Vaccination: No Implantable Devices None Family and Social History Never smoker; Marital Status - Married; Alcohol Use: Daily; Drug Use: No History; Caffeine Use: Moderate Electronic Signature(s) Signed: 04/17/2023 12:46:15 PM By: Geralyn Corwin DO Signed: 04/19/2023 11:36:48 AM By: [name] Pax RN Entered By: Geralyn Corwin on 04/17/2023 10:32:14 -------------------------------------------------------------------------------- SuperBill Details Patient Name: Date of Service: [name] Ina CK W. 04/17/2023 Medical Record Number: [id number] Patient Account Number: [id number] SHLOIME, CREESE ([id number]) [id number] Page 6 of 6 Date of Birth/Sex: Treating RN: [birth year] (76 y.o. 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 limited to breakdown of skin Facility Procedures : CPT4 Code: [id number] Description: [phone] - WOUND CARE VISIT-LEV 2 EST PT Modifier: Quantity: 1 Physician Procedures : CPT4 Code Description Modifier [id number] - WC PHYS LEVEL 3 - EST PT ICD-10 Diagnosis Description S80.811D Abrasion, right lower leg, subsequent encounter L97.911 Non-pressure chronic ulcer of unspecified part of right lower leg limited to breakdown  of skin Quantity: 1 Electronic Signature(s) Signed: 04/17/2023 12:46:15 PM By: Geralyn Corwin DO Previous Signature: 04/17/2023 10:29:14 AM Version By: [name] Pax RN Entered By: Geralyn Corwin on 04/17/2023 10:31:58

## 2023-06-11 DIAGNOSIS — H04123 Dry eye syndrome of bilateral lacrimal glands: Secondary | ICD-10-CM | POA: Diagnosis not present

## 2023-06-11 DIAGNOSIS — Z961 Presence of intraocular lens: Secondary | ICD-10-CM | POA: Diagnosis not present

## 2023-06-11 DIAGNOSIS — H34232 Retinal artery branch occlusion, left eye: Secondary | ICD-10-CM | POA: Diagnosis not present

## 2023-06-11 DIAGNOSIS — H04203 Unspecified epiphora, bilateral lacrimal glands: Secondary | ICD-10-CM | POA: Diagnosis not present

## 2023-06-28 DIAGNOSIS — X32XXXA Exposure to sunlight, initial encounter: Secondary | ICD-10-CM | POA: Diagnosis not present

## 2023-06-28 DIAGNOSIS — D0439 Carcinoma in situ of skin of other parts of face: Secondary | ICD-10-CM | POA: Diagnosis not present

## 2023-06-28 DIAGNOSIS — D2261 Melanocytic nevi of right upper limb, including shoulder: Secondary | ICD-10-CM | POA: Diagnosis not present

## 2023-06-28 DIAGNOSIS — D2271 Melanocytic nevi of right lower limb, including hip: Secondary | ICD-10-CM | POA: Diagnosis not present

## 2023-06-28 DIAGNOSIS — D044 Carcinoma in situ of skin of scalp and neck: Secondary | ICD-10-CM | POA: Diagnosis not present

## 2023-06-28 DIAGNOSIS — Z85820 Personal history of malignant melanoma of skin: Secondary | ICD-10-CM | POA: Diagnosis not present

## 2023-06-28 DIAGNOSIS — L570 Actinic keratosis: Secondary | ICD-10-CM | POA: Diagnosis not present

## 2023-06-28 DIAGNOSIS — D2262 Melanocytic nevi of left upper limb, including shoulder: Secondary | ICD-10-CM | POA: Diagnosis not present

## 2023-06-28 DIAGNOSIS — D485 Neoplasm of uncertain behavior of skin: Secondary | ICD-10-CM | POA: Diagnosis not present

## 2023-07-19 ENCOUNTER — Other Ambulatory Visit: Payer: Self-pay | Admitting: Cardiovascular Disease

## 2023-07-29 DIAGNOSIS — D044 Carcinoma in situ of skin of scalp and neck: Secondary | ICD-10-CM | POA: Diagnosis not present

## 2023-08-08 DIAGNOSIS — R4189 Other symptoms and signs involving cognitive functions and awareness: Secondary | ICD-10-CM | POA: Diagnosis not present

## 2023-08-08 DIAGNOSIS — E569 Vitamin deficiency, unspecified: Secondary | ICD-10-CM | POA: Diagnosis not present

## 2023-08-08 DIAGNOSIS — R351 Nocturia: Secondary | ICD-10-CM | POA: Diagnosis not present

## 2023-08-08 DIAGNOSIS — G20B2 Parkinson's disease with dyskinesia, with fluctuations: Secondary | ICD-10-CM | POA: Diagnosis not present

## 2023-08-23 ENCOUNTER — Other Ambulatory Visit: Payer: Self-pay | Admitting: Cardiovascular Disease

## 2023-08-28 ENCOUNTER — Ambulatory Visit: Payer: Medicare HMO | Admitting: Physical Therapy

## 2023-08-30 NOTE — Therapy (Deleted)
OUTPATIENT PHYSICAL THERAPY NEURO EVALUATION   Patient Name: [name] [name]. MRN: [id number] DOB:January 07, 1946, 77 y.o., male Today's Date: 08/30/2023   PCP: *** REFERRING PROVIDER: ***  END OF SESSION:   Past Medical History:  Diagnosis Date   Acute myocardial infarction of other inferior wall, episode of care unspecified 04/1993   Cancer (HCC)    skin / SCC   Coronary atherosclerosis of native coronary artery    History of shingles    waist area/right back side   Occlusion and stenosis of carotid artery without mention of cerebral infarction    Other and unspecified hyperlipidemia    Severe   Parkinson's disease    Peripheral vascular disease, unspecified (HCC)    Precancerous skin lesion    Prostate cancer (HCC) 10/2016   low risk   Shingles 07/2014   Squamous cell cancer of skin of nose    Unspecified cerebral artery occlusion with cerebral infarction 2006   CVA   Unspecified essential hypertension    Past Surgical History:  Procedure Laterality Date   BIOPSY PROSTATE  11/12/2017   CATARACT EXTRACTION W/PHACO Left 10/11/2021   Procedure: CATARACT EXTRACTION PHACO AND INTRAOCULAR LENS PLACEMENT (IOC) LEFT;  Surgeon: Lockie Mola, MD;  Location: [facility];  Service: Ophthalmology;  Laterality: Left;  5.15 01:01.6   CATARACT EXTRACTION W/PHACO Right 10/25/2021   Procedure: CATARACT EXTRACTION PHACO AND INTRAOCULAR LENS PLACEMENT (IOC) RIGHT;  Surgeon: Lockie Mola, MD;  Location: [facility];  Service: Ophthalmology;  Laterality: Right;  5.56 00:50.6   COLONOSCOPY WITH PROPOFOL N/A 02/11/2017   Procedure: COLONOSCOPY WITH PROPOFOL;  Surgeon: Scot Jun, MD;  Location: [facility];  Service: Endoscopy;  Laterality: N/A;   HERNIA REPAIR  02/17/15   right inguinal hernia , laparoscopic placement of Bard 3-D max mesh   HERNIA REPAIR  3/316   ventral hernia, umbilical, open repair   MOHS SURGERY     SKIN BIOPSY     skin cancer    TRANSRECTAL ULTRASOUND  11/12/2017   Patient Active Problem List   Diagnosis Date Noted   Malignant neoplasm of prostate (HCC) 06/09/2018   MCI (mild cognitive impairment) 03/27/2017   Herpes zona 04/19/2016   Umbilical hernia without obstruction and without gangrene 10/15/2014   Hernia of abdominal cavity 10/15/2014   Peripheral vascular disease (HCC) 10/02/2010   WEIGHT GAIN 02/21/2010   Hyperlipidemia 02/16/2010   Essential (primary) hypertension 02/16/2010   Old myocardial infarction 02/16/2010   Atherosclerotic heart disease of native coronary artery without angina pectoris 02/16/2010   Occlusion and stenosis of unspecified carotid artery 06/14/2009   Carotid artery obstruction 06/14/2009   PARKINSON'S DISEASE 05/12/2009    ONSET DATE: ***  REFERRING DIAG: G20.A1 (ICD-10-CM) - Parkinsons   THERAPY DIAG:  No diagnosis found.  Rationale for Evaluation and Treatment: {HABREHAB:27488}  SUBJECTIVE:  SUBJECTIVE STATEMENT: *** Pt accompanied by: {accompnied:27141}  PERTINENT HISTORY: ***  PAIN:  Are you having pain? {OPRCPAIN:27236}  PRECAUTIONS: {Therapy precautions:24002}  RED FLAGS: {PT Red Flags:29287}   WEIGHT BEARING RESTRICTIONS: {Yes ***/No:24003}  FALLS: Has patient fallen in last 6 months? {fallsyesno:27318}  LIVING ENVIRONMENT: Lives with: {OPRC lives with:25569::"lives with their family"} Lives in: {Lives in:25570} Stairs: {opstairs:27293} Has following equipment at home: {Assistive devices:23999}  PLOF: {PLOF:24004}  PATIENT GOALS: ***  OBJECTIVE:   DIAGNOSTIC FINDINGS: ***  COGNITION: Overall cognitive status: {cognition:24006}   SENSATION: {sensation:27233}  COORDINATION: ***  EDEMA:  {edema:24020}  MUSCLE TONE: {LE tone:25568}  MUSCLE  LENGTH: Hamstrings: Right *** deg; Left *** deg Thomas test: Right *** deg; Left *** deg  DTRs:  {DTR SITE:24025}  POSTURE: {posture:25561}  LOWER EXTREMITY ROM:     {AROM/[name]:27142}  Right Eval Left Eval  Hip flexion    Hip extension    Hip abduction    Hip adduction    Hip internal rotation    Hip external rotation    Knee flexion    Knee extension    Ankle dorsiflexion    Ankle plantarflexion    Ankle inversion    Ankle eversion     (Blank rows = not tested)  LOWER EXTREMITY MMT:    MMT Right Eval Left Eval  Hip flexion    Hip extension    Hip abduction    Hip adduction    Hip internal rotation    Hip external rotation    Knee flexion    Knee extension    Ankle dorsiflexion    Ankle plantarflexion    Ankle inversion    Ankle eversion    (Blank rows = not tested)  BED MOBILITY:  {Bed mobility:24027}  TRANSFERS: Assistive device utilized: {Assistive devices:23999}  Sit to stand: {Levels of assistance:24026} Stand to sit: {Levels of assistance:24026} Chair to chair: {Levels of assistance:24026} Floor: {Levels of assistance:24026}  RAMP:  Level of Assistance: {Levels of assistance:24026} Assistive device utilized: {Assistive devices:23999} Ramp Comments: ***  CURB:  Level of Assistance: {Levels of assistance:24026} Assistive device utilized: {Assistive devices:23999} Curb Comments: ***  STAIRS: Level of Assistance: {Levels of assistance:24026} Stair Negotiation Technique: {Stair Technique:27161} with {Rail Assistance:27162} Number of Stairs: ***  Height of Stairs: ***  Comments: ***  GAIT: Gait pattern: {gait characteristics:25376} Distance walked: *** Assistive device utilized: {Assistive devices:23999} Level of assistance: {Levels of assistance:24026} Comments: ***  FUNCTIONAL TESTS:  {Functional tests:24029}  PATIENT SURVEYS:  {rehab surveys:24030}  TODAY'S TREATMENT:                                                                                                                               DATE: ***    PATIENT EDUCATION: Education details: *** Person educated: {Person educated:25204} Education method: {Education Method:25205} Education comprehension: {Education Comprehension:25206}  HOME EXERCISE PROGRAM: ***  GOALS: Goals reviewed with patient? {yes/no:20286}  SHORT TERM GOALS: Target date: ***  ***  Baseline: Goal status: INITIAL  2.  *** Baseline:  Goal status: INITIAL  3.  *** Baseline:  Goal status: INITIAL  4.  *** Baseline:  Goal status: INITIAL  5.  *** Baseline:  Goal status: INITIAL  6.  *** Baseline:  Goal status: INITIAL  LONG TERM GOALS: Target date: ***  *** Baseline:  Goal status: INITIAL  2.  *** Baseline:  Goal status: INITIAL  3.  *** Baseline:  Goal status: INITIAL  4.  *** Baseline:  Goal status: INITIAL  5.  *** Baseline:  Goal status: INITIAL  6.  *** Baseline:  Goal status: INITIAL  ASSESSMENT:  CLINICAL IMPRESSION: Patient is a *** y.o. *** who was seen today for physical therapy evaluation and treatment for ***.   OBJECTIVE IMPAIRMENTS: {opptimpairments:25111}.   ACTIVITY LIMITATIONS: {activitylimitations:27494}  PARTICIPATION LIMITATIONS: {participationrestrictions:25113}  PERSONAL FACTORS: {Personal factors:25162} are also affecting patient's functional outcome.   REHAB POTENTIAL: {rehabpotential:25112}  CLINICAL DECISION MAKING: {clinical decision making:25114}  EVALUATION COMPLEXITY: {Evaluation complexity:25115}  PLAN:  PT FREQUENCY: {rehab frequency:25116}  PT DURATION: {rehab duration:25117}  PLANNED INTERVENTIONS: {rehab planned interventions:25118::"Therapeutic exercises","Therapeutic activity","Neuromuscular re-education","Balance training","Gait training","Patient/Family education","Self Care","Joint mobilization"}  PLAN FOR NEXT SESSION: ***   Norman Herrlich, PT 08/30/2023, 11:02 AM

## 2023-09-02 ENCOUNTER — Ambulatory Visit: Payer: Medicare HMO | Attending: Neurology | Admitting: Physical Therapy

## 2023-09-05 DIAGNOSIS — I739 Peripheral vascular disease, unspecified: Secondary | ICD-10-CM | POA: Diagnosis not present

## 2023-09-05 DIAGNOSIS — G20B1 Parkinson's disease with dyskinesia, without mention of fluctuations: Secondary | ICD-10-CM | POA: Diagnosis not present

## 2023-09-05 DIAGNOSIS — G3184 Mild cognitive impairment, so stated: Secondary | ICD-10-CM | POA: Diagnosis not present

## 2023-09-05 DIAGNOSIS — E785 Hyperlipidemia, unspecified: Secondary | ICD-10-CM | POA: Diagnosis not present

## 2023-09-05 DIAGNOSIS — I2510 Atherosclerotic heart disease of native coronary artery without angina pectoris: Secondary | ICD-10-CM | POA: Diagnosis not present

## 2023-09-05 DIAGNOSIS — I10 Essential (primary) hypertension: Secondary | ICD-10-CM | POA: Diagnosis not present

## 2023-09-05 DIAGNOSIS — M79671 Pain in right foot: Secondary | ICD-10-CM | POA: Diagnosis not present

## 2023-09-05 DIAGNOSIS — Z8546 Personal history of malignant neoplasm of prostate: Secondary | ICD-10-CM | POA: Diagnosis not present

## 2023-09-05 DIAGNOSIS — M79672 Pain in left foot: Secondary | ICD-10-CM | POA: Diagnosis not present

## 2023-09-05 DIAGNOSIS — Z0000 Encounter for general adult medical examination without abnormal findings: Secondary | ICD-10-CM | POA: Diagnosis not present

## 2023-09-09 ENCOUNTER — Ambulatory Visit: Payer: Medicare HMO | Admitting: Physical Therapy

## 2023-09-11 ENCOUNTER — Ambulatory Visit: Payer: Medicare HMO | Admitting: Physical Therapy

## 2023-09-16 ENCOUNTER — Ambulatory Visit: Payer: Medicare HMO | Admitting: Physical Therapy

## 2023-09-20 ENCOUNTER — Ambulatory Visit: Payer: Medicare HMO | Admitting: Physical Therapy

## 2023-09-23 ENCOUNTER — Ambulatory Visit: Payer: Medicare HMO | Admitting: Physical Therapy

## 2023-09-25 ENCOUNTER — Ambulatory Visit: Payer: Medicare HMO | Admitting: Physical Therapy

## 2023-09-29 ENCOUNTER — Other Ambulatory Visit: Payer: Self-pay | Admitting: Cardiovascular Disease

## 2023-09-29 DIAGNOSIS — I9589 Other hypotension: Secondary | ICD-10-CM

## 2023-09-29 DIAGNOSIS — I10 Essential (primary) hypertension: Secondary | ICD-10-CM

## 2023-09-30 ENCOUNTER — Ambulatory Visit: Payer: Medicare HMO | Admitting: Physical Therapy

## 2023-10-02 ENCOUNTER — Ambulatory Visit: Payer: Medicare HMO | Admitting: Physical Therapy

## 2023-10-07 ENCOUNTER — Ambulatory Visit: Payer: Medicare HMO | Admitting: Physical Therapy

## 2023-10-09 ENCOUNTER — Ambulatory Visit: Payer: Medicare HMO | Admitting: Physical Therapy

## 2023-10-11 DIAGNOSIS — M5416 Radiculopathy, lumbar region: Secondary | ICD-10-CM | POA: Diagnosis not present

## 2023-10-17 ENCOUNTER — Ambulatory Visit: Payer: Medicare HMO | Admitting: Physical Therapy

## 2023-10-23 DIAGNOSIS — M5416 Radiculopathy, lumbar region: Secondary | ICD-10-CM | POA: Diagnosis not present

## 2023-10-24 ENCOUNTER — Ambulatory Visit: Payer: Medicare HMO | Admitting: Physical Therapy

## 2023-10-24 DIAGNOSIS — M5416 Radiculopathy, lumbar region: Secondary | ICD-10-CM | POA: Diagnosis not present

## 2023-10-26 DIAGNOSIS — G8929 Other chronic pain: Secondary | ICD-10-CM | POA: Diagnosis not present

## 2023-10-26 DIAGNOSIS — G20C Parkinsonism, unspecified: Secondary | ICD-10-CM | POA: Diagnosis not present

## 2023-10-26 DIAGNOSIS — M545 Low back pain, unspecified: Secondary | ICD-10-CM | POA: Diagnosis not present

## 2023-10-26 DIAGNOSIS — R5381 Other malaise: Secondary | ICD-10-CM | POA: Diagnosis not present

## 2023-10-27 DIAGNOSIS — G20A1 Parkinson's disease without dyskinesia, without mention of fluctuations: Secondary | ICD-10-CM | POA: Diagnosis not present

## 2023-10-27 DIAGNOSIS — M5416 Radiculopathy, lumbar region: Secondary | ICD-10-CM | POA: Diagnosis not present

## 2023-10-27 DIAGNOSIS — E785 Hyperlipidemia, unspecified: Secondary | ICD-10-CM | POA: Diagnosis not present

## 2023-10-27 DIAGNOSIS — I10 Essential (primary) hypertension: Secondary | ICD-10-CM | POA: Diagnosis not present

## 2023-10-28 ENCOUNTER — Ambulatory Visit: Payer: Medicare HMO | Admitting: Physical Therapy

## 2023-10-28 DIAGNOSIS — G9529 Other cord compression: Secondary | ICD-10-CM | POA: Diagnosis not present

## 2023-10-28 DIAGNOSIS — G20A1 Parkinson's disease without dyskinesia, without mention of fluctuations: Secondary | ICD-10-CM | POA: Diagnosis not present

## 2023-10-28 DIAGNOSIS — M5416 Radiculopathy, lumbar region: Secondary | ICD-10-CM | POA: Diagnosis not present

## 2023-10-28 DIAGNOSIS — I10 Essential (primary) hypertension: Secondary | ICD-10-CM | POA: Diagnosis not present

## 2023-10-28 DIAGNOSIS — E785 Hyperlipidemia, unspecified: Secondary | ICD-10-CM | POA: Diagnosis not present

## 2023-10-29 DIAGNOSIS — M5416 Radiculopathy, lumbar region: Secondary | ICD-10-CM | POA: Diagnosis not present

## 2023-10-30 ENCOUNTER — Ambulatory Visit: Payer: Medicare HMO | Admitting: Physical Therapy

## 2023-10-30 DIAGNOSIS — M5416 Radiculopathy, lumbar region: Secondary | ICD-10-CM | POA: Diagnosis not present

## 2023-10-31 DIAGNOSIS — M5416 Radiculopathy, lumbar region: Secondary | ICD-10-CM | POA: Diagnosis not present

## 2023-11-01 DIAGNOSIS — M5416 Radiculopathy, lumbar region: Secondary | ICD-10-CM | POA: Diagnosis not present

## 2023-11-02 DIAGNOSIS — M5416 Radiculopathy, lumbar region: Secondary | ICD-10-CM | POA: Diagnosis not present

## 2023-11-03 DIAGNOSIS — M5416 Radiculopathy, lumbar region: Secondary | ICD-10-CM | POA: Diagnosis not present

## 2023-11-03 DIAGNOSIS — I10 Essential (primary) hypertension: Secondary | ICD-10-CM | POA: Diagnosis not present

## 2023-11-03 DIAGNOSIS — G20A1 Parkinson's disease without dyskinesia, without mention of fluctuations: Secondary | ICD-10-CM | POA: Diagnosis not present

## 2023-11-03 DIAGNOSIS — E785 Hyperlipidemia, unspecified: Secondary | ICD-10-CM | POA: Diagnosis not present

## 2023-11-04 DIAGNOSIS — I10 Essential (primary) hypertension: Secondary | ICD-10-CM | POA: Diagnosis not present

## 2023-11-04 DIAGNOSIS — G20A1 Parkinson's disease without dyskinesia, without mention of fluctuations: Secondary | ICD-10-CM | POA: Diagnosis not present

## 2023-11-04 DIAGNOSIS — M5416 Radiculopathy, lumbar region: Secondary | ICD-10-CM | POA: Diagnosis not present

## 2023-11-04 DIAGNOSIS — E785 Hyperlipidemia, unspecified: Secondary | ICD-10-CM | POA: Diagnosis not present

## 2023-11-05 ENCOUNTER — Ambulatory Visit: Payer: Medicare HMO | Admitting: Physical Therapy

## 2023-11-05 DIAGNOSIS — I10 Essential (primary) hypertension: Secondary | ICD-10-CM | POA: Diagnosis not present

## 2023-11-05 DIAGNOSIS — G20A1 Parkinson's disease without dyskinesia, without mention of fluctuations: Secondary | ICD-10-CM | POA: Diagnosis not present

## 2023-11-05 DIAGNOSIS — E785 Hyperlipidemia, unspecified: Secondary | ICD-10-CM | POA: Diagnosis not present

## 2023-11-05 DIAGNOSIS — M5416 Radiculopathy, lumbar region: Secondary | ICD-10-CM | POA: Diagnosis not present

## 2023-11-06 DIAGNOSIS — M5416 Radiculopathy, lumbar region: Secondary | ICD-10-CM | POA: Diagnosis not present

## 2023-11-06 DIAGNOSIS — G20A1 Parkinson's disease without dyskinesia, without mention of fluctuations: Secondary | ICD-10-CM | POA: Diagnosis not present

## 2023-11-06 DIAGNOSIS — I10 Essential (primary) hypertension: Secondary | ICD-10-CM | POA: Diagnosis not present

## 2023-11-06 DIAGNOSIS — E785 Hyperlipidemia, unspecified: Secondary | ICD-10-CM | POA: Diagnosis not present

## 2023-11-07 ENCOUNTER — Ambulatory Visit: Payer: Medicare HMO | Admitting: Physical Therapy

## 2023-11-07 DIAGNOSIS — R52 Pain, unspecified: Secondary | ICD-10-CM | POA: Diagnosis not present

## 2023-11-07 DIAGNOSIS — I10 Essential (primary) hypertension: Secondary | ICD-10-CM | POA: Diagnosis not present

## 2023-11-07 DIAGNOSIS — G20A1 Parkinson's disease without dyskinesia, without mention of fluctuations: Secondary | ICD-10-CM | POA: Diagnosis not present

## 2023-11-07 DIAGNOSIS — R5381 Other malaise: Secondary | ICD-10-CM | POA: Diagnosis not present

## 2023-11-07 DIAGNOSIS — M545 Low back pain, unspecified: Secondary | ICD-10-CM | POA: Diagnosis not present

## 2023-11-07 DIAGNOSIS — G8929 Other chronic pain: Secondary | ICD-10-CM | POA: Diagnosis not present

## 2023-11-07 DIAGNOSIS — E785 Hyperlipidemia, unspecified: Secondary | ICD-10-CM | POA: Diagnosis not present

## 2023-11-07 DIAGNOSIS — M5416 Radiculopathy, lumbar region: Secondary | ICD-10-CM | POA: Diagnosis not present

## 2023-11-12 ENCOUNTER — Ambulatory Visit: Payer: Medicare HMO | Admitting: Physical Therapy

## 2023-11-13 DIAGNOSIS — M5416 Radiculopathy, lumbar region: Secondary | ICD-10-CM | POA: Diagnosis not present

## 2023-11-13 DIAGNOSIS — I10 Essential (primary) hypertension: Secondary | ICD-10-CM | POA: Diagnosis not present

## 2023-11-13 DIAGNOSIS — G20A1 Parkinson's disease without dyskinesia, without mention of fluctuations: Secondary | ICD-10-CM | POA: Diagnosis not present

## 2023-11-13 DIAGNOSIS — R52 Pain, unspecified: Secondary | ICD-10-CM | POA: Diagnosis not present

## 2023-11-13 DIAGNOSIS — Z8546 Personal history of malignant neoplasm of prostate: Secondary | ICD-10-CM | POA: Diagnosis not present

## 2023-11-13 DIAGNOSIS — Z9181 History of falling: Secondary | ICD-10-CM | POA: Diagnosis not present

## 2023-11-13 DIAGNOSIS — R5381 Other malaise: Secondary | ICD-10-CM | POA: Diagnosis not present

## 2023-11-13 DIAGNOSIS — E785 Hyperlipidemia, unspecified: Secondary | ICD-10-CM | POA: Diagnosis not present

## 2023-11-18 DIAGNOSIS — E785 Hyperlipidemia, unspecified: Secondary | ICD-10-CM | POA: Diagnosis not present

## 2023-11-18 DIAGNOSIS — R52 Pain, unspecified: Secondary | ICD-10-CM | POA: Diagnosis not present

## 2023-11-18 DIAGNOSIS — G20B2 Parkinson's disease with dyskinesia, with fluctuations: Secondary | ICD-10-CM | POA: Diagnosis not present

## 2023-11-18 DIAGNOSIS — M5416 Radiculopathy, lumbar region: Secondary | ICD-10-CM | POA: Diagnosis not present

## 2023-11-18 DIAGNOSIS — R5381 Other malaise: Secondary | ICD-10-CM | POA: Diagnosis not present

## 2023-11-18 DIAGNOSIS — G479 Sleep disorder, unspecified: Secondary | ICD-10-CM | POA: Diagnosis not present

## 2023-11-18 DIAGNOSIS — G20A1 Parkinson's disease without dyskinesia, without mention of fluctuations: Secondary | ICD-10-CM | POA: Diagnosis not present

## 2023-11-18 DIAGNOSIS — R4189 Other symptoms and signs involving cognitive functions and awareness: Secondary | ICD-10-CM | POA: Diagnosis not present

## 2023-11-18 DIAGNOSIS — Z9181 History of falling: Secondary | ICD-10-CM | POA: Diagnosis not present

## 2023-11-18 DIAGNOSIS — Z8546 Personal history of malignant neoplasm of prostate: Secondary | ICD-10-CM | POA: Diagnosis not present

## 2023-11-18 DIAGNOSIS — I10 Essential (primary) hypertension: Secondary | ICD-10-CM | POA: Diagnosis not present

## 2023-11-18 DIAGNOSIS — E538 Deficiency of other specified B group vitamins: Secondary | ICD-10-CM | POA: Diagnosis not present

## 2023-11-19 DIAGNOSIS — M5416 Radiculopathy, lumbar region: Secondary | ICD-10-CM | POA: Diagnosis not present

## 2023-11-19 DIAGNOSIS — R5381 Other malaise: Secondary | ICD-10-CM | POA: Diagnosis not present

## 2023-11-19 DIAGNOSIS — Z8546 Personal history of malignant neoplasm of prostate: Secondary | ICD-10-CM | POA: Diagnosis not present

## 2023-11-19 DIAGNOSIS — I10 Essential (primary) hypertension: Secondary | ICD-10-CM | POA: Diagnosis not present

## 2023-11-19 DIAGNOSIS — E785 Hyperlipidemia, unspecified: Secondary | ICD-10-CM | POA: Diagnosis not present

## 2023-11-19 DIAGNOSIS — R52 Pain, unspecified: Secondary | ICD-10-CM | POA: Diagnosis not present

## 2023-11-19 DIAGNOSIS — G20A1 Parkinson's disease without dyskinesia, without mention of fluctuations: Secondary | ICD-10-CM | POA: Diagnosis not present

## 2023-11-19 DIAGNOSIS — Z9181 History of falling: Secondary | ICD-10-CM | POA: Diagnosis not present

## 2023-11-22 DIAGNOSIS — G4701 Insomnia due to medical condition: Secondary | ICD-10-CM | POA: Diagnosis not present

## 2023-11-22 DIAGNOSIS — Z8546 Personal history of malignant neoplasm of prostate: Secondary | ICD-10-CM | POA: Diagnosis not present

## 2023-11-22 DIAGNOSIS — M5416 Radiculopathy, lumbar region: Secondary | ICD-10-CM | POA: Diagnosis not present

## 2023-11-22 DIAGNOSIS — G20B1 Parkinson's disease with dyskinesia, without mention of fluctuations: Secondary | ICD-10-CM | POA: Diagnosis not present

## 2023-11-22 DIAGNOSIS — G20A1 Parkinson's disease without dyskinesia, without mention of fluctuations: Secondary | ICD-10-CM | POA: Diagnosis not present

## 2023-11-22 DIAGNOSIS — N3945 Continuous leakage: Secondary | ICD-10-CM | POA: Diagnosis not present

## 2023-11-22 DIAGNOSIS — R5381 Other malaise: Secondary | ICD-10-CM | POA: Diagnosis not present

## 2023-11-22 DIAGNOSIS — Z9181 History of falling: Secondary | ICD-10-CM | POA: Diagnosis not present

## 2023-11-22 DIAGNOSIS — I2510 Atherosclerotic heart disease of native coronary artery without angina pectoris: Secondary | ICD-10-CM | POA: Diagnosis not present

## 2023-11-22 DIAGNOSIS — G3184 Mild cognitive impairment, so stated: Secondary | ICD-10-CM | POA: Diagnosis not present

## 2023-11-22 DIAGNOSIS — R52 Pain, unspecified: Secondary | ICD-10-CM | POA: Diagnosis not present

## 2023-11-22 DIAGNOSIS — E785 Hyperlipidemia, unspecified: Secondary | ICD-10-CM | POA: Diagnosis not present

## 2023-11-22 DIAGNOSIS — I10 Essential (primary) hypertension: Secondary | ICD-10-CM | POA: Diagnosis not present

## 2023-11-25 DIAGNOSIS — G20A1 Parkinson's disease without dyskinesia, without mention of fluctuations: Secondary | ICD-10-CM | POA: Diagnosis not present

## 2023-11-25 DIAGNOSIS — R5381 Other malaise: Secondary | ICD-10-CM | POA: Diagnosis not present

## 2023-11-25 DIAGNOSIS — Z9181 History of falling: Secondary | ICD-10-CM | POA: Diagnosis not present

## 2023-11-25 DIAGNOSIS — I10 Essential (primary) hypertension: Secondary | ICD-10-CM | POA: Diagnosis not present

## 2023-11-25 DIAGNOSIS — E785 Hyperlipidemia, unspecified: Secondary | ICD-10-CM | POA: Diagnosis not present

## 2023-11-25 DIAGNOSIS — Z8546 Personal history of malignant neoplasm of prostate: Secondary | ICD-10-CM | POA: Diagnosis not present

## 2023-11-25 DIAGNOSIS — M5416 Radiculopathy, lumbar region: Secondary | ICD-10-CM | POA: Diagnosis not present

## 2023-11-25 DIAGNOSIS — R52 Pain, unspecified: Secondary | ICD-10-CM | POA: Diagnosis not present

## 2023-11-26 DIAGNOSIS — G20A1 Parkinson's disease without dyskinesia, without mention of fluctuations: Secondary | ICD-10-CM | POA: Diagnosis not present

## 2023-11-26 DIAGNOSIS — I10 Essential (primary) hypertension: Secondary | ICD-10-CM | POA: Diagnosis not present

## 2023-11-26 DIAGNOSIS — E785 Hyperlipidemia, unspecified: Secondary | ICD-10-CM | POA: Diagnosis not present

## 2023-11-26 DIAGNOSIS — R52 Pain, unspecified: Secondary | ICD-10-CM | POA: Diagnosis not present

## 2023-11-26 DIAGNOSIS — R5381 Other malaise: Secondary | ICD-10-CM | POA: Diagnosis not present

## 2023-11-26 DIAGNOSIS — Z8546 Personal history of malignant neoplasm of prostate: Secondary | ICD-10-CM | POA: Diagnosis not present

## 2023-11-26 DIAGNOSIS — M5416 Radiculopathy, lumbar region: Secondary | ICD-10-CM | POA: Diagnosis not present

## 2023-11-26 DIAGNOSIS — Z9181 History of falling: Secondary | ICD-10-CM | POA: Diagnosis not present

## 2023-11-28 DIAGNOSIS — R5381 Other malaise: Secondary | ICD-10-CM | POA: Diagnosis not present

## 2023-11-28 DIAGNOSIS — Z9181 History of falling: Secondary | ICD-10-CM | POA: Diagnosis not present

## 2023-11-28 DIAGNOSIS — G20A1 Parkinson's disease without dyskinesia, without mention of fluctuations: Secondary | ICD-10-CM | POA: Diagnosis not present

## 2023-11-28 DIAGNOSIS — I10 Essential (primary) hypertension: Secondary | ICD-10-CM | POA: Diagnosis not present

## 2023-11-28 DIAGNOSIS — M5416 Radiculopathy, lumbar region: Secondary | ICD-10-CM | POA: Diagnosis not present

## 2023-11-28 DIAGNOSIS — R52 Pain, unspecified: Secondary | ICD-10-CM | POA: Diagnosis not present

## 2023-11-28 DIAGNOSIS — E785 Hyperlipidemia, unspecified: Secondary | ICD-10-CM | POA: Diagnosis not present

## 2023-11-28 DIAGNOSIS — Z8546 Personal history of malignant neoplasm of prostate: Secondary | ICD-10-CM | POA: Diagnosis not present

## 2023-12-26 ENCOUNTER — Other Ambulatory Visit: Payer: Self-pay | Admitting: Cardiovascular Disease

## 2023-12-26 DIAGNOSIS — I10 Essential (primary) hypertension: Secondary | ICD-10-CM

## 2023-12-26 DIAGNOSIS — I9589 Other hypotension: Secondary | ICD-10-CM

## 2024-02-12 ENCOUNTER — Other Ambulatory Visit: Payer: Self-pay | Admitting: Cardiovascular Disease

## 2024-03-11 ENCOUNTER — Emergency Department

## 2024-03-11 ENCOUNTER — Other Ambulatory Visit: Payer: Self-pay

## 2024-03-11 ENCOUNTER — Emergency Department
Admission: EM | Admit: 2024-03-11 | Discharge: 2024-03-12 | Disposition: A | Discharge status: Home / Self Care | Attending: Emergency Medicine | Admitting: Emergency Medicine

## 2024-03-11 DIAGNOSIS — I959 Hypotension, unspecified: Secondary | ICD-10-CM | POA: Diagnosis present

## 2024-03-11 DIAGNOSIS — Z8546 Personal history of malignant neoplasm of prostate: Secondary | ICD-10-CM | POA: Diagnosis not present

## 2024-03-11 DIAGNOSIS — I2510 Atherosclerotic heart disease of native coronary artery without angina pectoris: Secondary | ICD-10-CM | POA: Diagnosis not present

## 2024-03-11 DIAGNOSIS — G909 Disorder of the autonomic nervous system, unspecified: Secondary | ICD-10-CM | POA: Insufficient documentation

## 2024-03-11 DIAGNOSIS — W07XXXA Fall from chair, initial encounter: Secondary | ICD-10-CM | POA: Diagnosis not present

## 2024-03-11 DIAGNOSIS — Y92009 Unspecified place in unspecified non-institutional (private) residence as the place of occurrence of the external cause: Secondary | ICD-10-CM | POA: Insufficient documentation

## 2024-03-11 DIAGNOSIS — G20C Parkinsonism, unspecified: Secondary | ICD-10-CM | POA: Insufficient documentation

## 2024-03-11 DIAGNOSIS — R531 Weakness: Secondary | ICD-10-CM | POA: Diagnosis not present

## 2024-03-11 DIAGNOSIS — I10 Essential (primary) hypertension: Secondary | ICD-10-CM | POA: Diagnosis not present

## 2024-03-11 DIAGNOSIS — Z043 Encounter for examination and observation following other accident: Secondary | ICD-10-CM | POA: Diagnosis not present

## 2024-03-11 DIAGNOSIS — W19XXXA Unspecified fall, initial encounter: Secondary | ICD-10-CM

## 2024-03-11 LAB — CBC WITH DIFFERENTIAL/PLATELET
Abs Immature Granulocytes: 0.01 10*3/uL (ref 0.00–0.07)
Basophils Absolute: 0 10*3/uL (ref 0.0–0.1)
Basophils Relative: 0 %
Eosinophils Absolute: 0.1 10*3/uL (ref 0.0–0.5)
Eosinophils Relative: 1 %
HCT: 36.5 % — ABNORMAL LOW (ref 39.0–52.0)
Hemoglobin: 12 g/dL — ABNORMAL LOW (ref 13.0–17.0)
Immature Granulocytes: 0 %
Lymphocytes Relative: 20 %
Lymphs Abs: 0.9 10*3/uL (ref 0.7–4.0)
MCH: 30.8 pg (ref 26.0–34.0)
MCHC: 32.9 g/dL (ref 30.0–36.0)
MCV: 93.6 fL (ref 80.0–100.0)
Monocytes Absolute: 0.5 10*3/uL (ref 0.1–1.0)
Monocytes Relative: 11 %
Neutro Abs: 3 10*3/uL (ref 1.7–7.7)
Neutrophils Relative %: 68 %
Platelets: 161 10*3/uL (ref 150–400)
RBC: 3.9 MIL/uL — ABNORMAL LOW (ref 4.22–5.81)
RDW: 12.4 % (ref 11.5–15.5)
WBC: 4.5 10*3/uL (ref 4.0–10.5)
nRBC: 0 % (ref 0.0–0.2)

## 2024-03-11 LAB — COMPREHENSIVE METABOLIC PANEL
ALT: 6 U/L (ref 0–44)
AST: 19 U/L (ref 15–41)
Albumin: 3.4 g/dL — ABNORMAL LOW (ref 3.5–5.0)
Alkaline Phosphatase: 50 U/L (ref 38–126)
Anion gap: 7 (ref 5–15)
BUN: 14 mg/dL (ref 8–23)
CO2: 26 mmol/L (ref 22–32)
Calcium: 8.6 mg/dL — ABNORMAL LOW (ref 8.9–10.3)
Chloride: 104 mmol/L (ref 98–111)
Creatinine, Ser: 0.49 mg/dL — ABNORMAL LOW (ref 0.61–1.24)
GFR, Estimated: >60 mL/min (ref >60)
Glucose, Bld: 107 mg/dL — ABNORMAL HIGH (ref 70–99)
Potassium: 3.8 mmol/L (ref 3.5–5.1)
Sodium: 137 mmol/L (ref 135–145)
Total Bilirubin: 1.2 mg/dL (ref 0.0–1.2)
Total Protein: 6.3 g/dL — ABNORMAL LOW (ref 6.5–8.1)

## 2024-03-11 MED ORDER — LACTATED RINGERS IV BOLUS
1000.0000 mL | Freq: Once | INTRAVENOUS | Status: AC
  Administered 2024-03-11: 1000 mL via INTRAVENOUS

## 2024-03-11 NOTE — ED Provider Notes (Signed)
 [facility] Provider Note    Event Date/Time   First MD Initiated Contact with Patient 03/11/24 2051     (approximate)   History   Hypotension   HPI  [name] [name]. is a 78 y.o. male who presents to the ED for evaluation of Hypotension   Review of routine PCP visit from September of last year.  History of Parkinson's disease, HTN, HLD, PAD, CAD prostate cancer, GERD.   Patient presents to the ED from home via EMS for evaluation of generalized weakness and sliding out of a chair this evening at his home.  Found to have low BP with EMS, 80/40 on arrival at the scene.  Here in the ED, patient reports feeling okay.  Chronic intermittent nausea and "hearing my stomach."  Denies any pain, recent fevers or illnesses.  He is accompanied by a close friend and neighbor who is familiar with the patient.  When patient slid off the chair onto the ground his wife was unable to get him up so she called his neighbor who helped get the patient up into a chair.  Friend denies any seizure activity, syncope.   They do report the patient had a "bad day" yesterday with generalized weakness but no particular symptoms or events.   Physical Exam   Triage Vital Signs: ED Triage Vitals  Encounter Vitals Group     BP      Systolic BP Percentile      Diastolic BP Percentile      Pulse      Resp      Temp      Temp src      SpO2      Weight      Height      Head Circumference      Peak Flow      Pain Score      Pain Loc      Pain Education      Exclude from Growth Chart     Most recent vital signs: Vitals:   03/11/24 2200 03/11/24 2215  BP: 137/83 (!) 148/93  Pulse:  (!) 57  Resp: 13 16  Temp:    SpO2:  96%    General: Awake, no distress.  Parkinsonism, but pleasant and conversational CV:  Good peripheral perfusion.  Resp:  Normal effort.  Clear Abd:  No distention.  Soft and benign without tenderness, guarding or peritoneal features. MSK:  No deformity  noted.  Palpation of all 4 extremities of the back without signs of deformity, tenderness or trauma. Neuro:  No focal deficits appreciated. Other:     ED Results / Procedures / Treatments   Labs (all labs ordered are listed, but only abnormal results are displayed) Labs Reviewed  CBC WITH DIFFERENTIAL/PLATELET - Abnormal; Notable for the following components:      Result Value   RBC 3.90 (*)    Hemoglobin 12.0 (*)    HCT 36.5 (*)    All other components within normal limits  COMPREHENSIVE METABOLIC PANEL - Abnormal; Notable for the following components:   Glucose, Bld 107 (*)    Creatinine, Ser 0.49 (*)    Calcium 8.6 (*)    Total Protein 6.3 (*)    Albumin 3.4 (*)    All other components within normal limits  RESP PANEL BY RT-PCR (RSV, FLU A&B, COVID)  [id number]  URINALYSIS, ROUTINE W REFLEX MICROSCOPIC    EKG Sinus bradycardia with a rate of 52  bpm.  Right bundle.  No STEMI.  RADIOLOGY CXR interpreted by me without evidence of acute cardiopulmonary pathology.  Official radiology report(s): DG Chest Portable 1 View Result Date: 03/11/2024 CLINICAL DATA:  low BP, minor fall. eval infiltrate EXAM: PORTABLE CHEST 1 VIEW COMPARISON:  Chest x-ray 12/13/2014 FINDINGS: The heart and mediastinal contours are unchanged. Atherosclerotic plaque. No focal consolidation. No pulmonary edema. No pleural effusion. No pneumothorax. No acute osseous abnormality. IMPRESSION: 1. No active disease. 2.  Aortic Atherosclerosis (ICD10-I70.0). Electronically Signed   By: Tish Frederickson M.D.   On: 03/11/2024 22:12    PROCEDURES and INTERVENTIONS:  .1-3 Lead EKG Interpretation  Performed by: Delton Prairie, MD Authorized by: Delton Prairie, MD     Interpretation: normal     ECG rate:  60   ECG rate assessment: normal     Rhythm: sinus rhythm     Ectopy: none     Conduction: normal     Medications  lactated ringers bolus 1,000 mL (1,000 mLs Intravenous New Bag/Given 03/11/24 2139)      IMPRESSION / MDM / ASSESSMENT AND PLAN / ED COURSE  I reviewed the triage vital signs and the nursing notes.  Differential diagnosis includes, but is not limited to, orthostatic hypotension, autonomic instability, dehydration or AKI,  {Patient presents with symptoms of an acute illness or injury that is potentially life-threatening.   Patient with known Parkinson's disease presents after sliding out of a chair at home without signs of significant injury, neurologic deficits.  Strong suspicion for autonomic instability in the setting of his parkinsonism.  Low BP with EMS but remains normotensive throughout his stay in the ED and asymptomatic.  No signs of trauma or injuries.  Reassuring blood work with normal CBC and metabolic panel.  Clear CXR.  We will send a COVID/flu a bad day." My hope is he will be suitable for outpatient management.       FINAL CLINICAL IMPRESSION(S) / ED DIAGNOSES   Final diagnoses:  Fall, initial encounter  Weakness  Autonomic instability     Rx / DC Orders   ED Discharge Orders     None        Note:  This document was prepared using Dragon voice recognition software and may include unintentional dictation errors.   Delton Prairie, MD 03/11/24 2258

## 2024-03-11 NOTE — ED Triage Notes (Signed)
 Pt arrives via ems from home with c/o fall. Per pt's wife pt slid out of chair. Denies hitting his head. Unsure if pt is prescribed blood thinners. Negative [facility] for EMS. Hx of RBBB and presents with hypotension. Initial BP for EMS is 80/44. Pt alert and oriented x4. Hx of parkinsons.

## 2024-03-12 LAB — URINALYSIS, ROUTINE W REFLEX MICROSCOPIC
Bilirubin Urine: NEGATIVE
Glucose, UA: NEGATIVE mg/dL
Hgb urine dipstick: NEGATIVE
Ketones, ur: NEGATIVE mg/dL
Leukocytes,Ua: NEGATIVE
Nitrite: NEGATIVE
Protein, ur: NEGATIVE mg/dL
Specific Gravity, Urine: 1.011 (ref 1.005–1.030)
pH: 6 (ref 5.0–8.0)

## 2024-03-12 LAB — RESP PANEL BY RT-PCR (RSV, FLU A&B, COVID)  RVPGX2
Influenza A by PCR: NEGATIVE
Influenza B by PCR: NEGATIVE
Resp Syncytial Virus by PCR: NEGATIVE
SARS Coronavirus 2 by RT PCR: NEGATIVE

## 2024-03-12 NOTE — ED Provider Notes (Signed)
 Signed out pending viral swabs and UA. Testing negative.  Patient feels well with no complaints currently.  Was able to stand him up and have him take a few steps at bedside with no problems.  Use of walker at home.  Discharge.   Pilar Jarvis, MD 03/12/24 [id number]

## 2024-03-12 NOTE — ED Notes (Signed)
 Pt able to stand and perform shuffling gait with Modesto Charon MD. Pt reports using walker regularly.

## 2024-04-17 ENCOUNTER — Ambulatory Visit: Attending: Nurse Practitioner | Admitting: Nurse Practitioner

## 2024-04-17 ENCOUNTER — Encounter: Payer: Self-pay | Admitting: Nurse Practitioner

## 2024-04-17 NOTE — Progress Notes (Deleted)
 Office Visit    Patient Name: [name] [name]. Date of Encounter: 04/17/2024  Primary Care Provider:  Nikki Barters, MD Primary Cardiologist:  None  Chief Complaint    78 y.o. male with a history of CAD status post myocardial infarction 1994 with medical management of distal RCA disease, peripheral arterial disease, aortic stenosis, hypertension, hyperlipidemia, and Parkinson's, who presents for CAD follow-up.  Past Medical History  Subjective   Past Medical History:  Diagnosis Date   Acute myocardial infarction of other inferior wall, episode of care unspecified 04/1993   Cancer (HCC)    skin / SCC   Carotid arterial disease (HCC)    a. 10/2018 Carotid U/S: mild bilat < 39%.   Coronary atherosclerosis of native coronary artery    a. 1994 s/p MI-->RCA dzs-->Med rx; b. 10/2008 Ex MV: No ischemia/infarct.   History of shingles    waist area/right back side   Moderate aortic stenosis    a. 07/2019 Echo: EF 55-60%, nl RV fxn, mod dil LA, mild AS; b. 01/2022 Echo: EF 55-60%, no rwma, GrII DD, nl RV fxn, RVSP 25.75mmHg. Mod dil LA. Mild MR. Mod AS.   Occlusion and stenosis of carotid artery without mention of cerebral infarction    Other and unspecified hyperlipidemia    Severe   Parkinson's disease    Peripheral vascular disease, unspecified (HCC)    Precancerous skin lesion    Prostate cancer (HCC) 10/2016   low risk   Shingles 07/2014   Squamous cell cancer of skin of nose    Unspecified cerebral artery occlusion with cerebral infarction 2006   CVA   Unspecified essential hypertension    Past Surgical History:  Procedure Laterality Date   BIOPSY PROSTATE  11/12/2017   CATARACT EXTRACTION W/PHACO Left 10/11/2021   Procedure: CATARACT EXTRACTION PHACO AND INTRAOCULAR LENS PLACEMENT (IOC) LEFT;  Surgeon: Annell Kidney, MD;  Location: [facility];  Service: Ophthalmology;  Laterality: Left;  5.15 01:01.6   CATARACT EXTRACTION W/PHACO Right 10/25/2021    Procedure: CATARACT EXTRACTION PHACO AND INTRAOCULAR LENS PLACEMENT (IOC) RIGHT;  Surgeon: Annell Kidney, MD;  Location: [facility];  Service: Ophthalmology;  Laterality: Right;  5.56 00:50.6   COLONOSCOPY WITH PROPOFOL  N/A 02/11/2017   Procedure: COLONOSCOPY WITH PROPOFOL ;  Surgeon: Cassie Click, MD;  Location: [facility];  Service: Endoscopy;  Laterality: N/A;   HERNIA REPAIR  02/17/15   right inguinal hernia , laparoscopic placement of Bard 3-D max mesh   HERNIA REPAIR  3/316   ventral hernia, umbilical, open repair   MOHS SURGERY     SKIN BIOPSY     skin cancer   TRANSRECTAL ULTRASOUND  11/12/2017    Allergies  Allergies  Allergen Reactions   Donepezil Nausea Only    Sick feeling   Penicillins Rash    Allergy as a kid      History of Present Illness      78 y.o. y/o male with a history of CAD, peripheral arterial disease, aortic stenosis, hypertension, hyperlipidemia, and Parkinson's.  [name] [name] suffered a myocardial infarction in 1994.  Notes indicate that he had significant RCA disease, which was medically managed.  He subsequently had a low risk exercise Myoview in November 2009.  Echo in August 2020 showed normal LV function with mild aortic stenosis.  Most recent echo in February 2023 showed an EF of 55 to 60% with grade 2 diastolic dysfunction, normal RV function, moderately dilated left atrium, mild MR,  and moderate aortic stenosis.   [name] [name] was last seen in cardiology clinic in February 2020 for, at which time he reported chronic fatigue. Objective  Home Medications    Current Outpatient Medications  Medication Sig Dispense Refill   Amantadine HCl 100 MG tablet Take 100 mg by mouth 2 (two) times daily.     amLODipine  (NORVASC ) 5 MG tablet TAKE 1 TABLET (5 MG TOTAL) BY MOUTH DAILY. 90 tablet 0   atorvastatin  (LIPITOR) 80 MG tablet TAKE ONE TABLET (80 MG) BY MOUTH ONCE DAILY 90 tablet 0   B Complex Vitamins (VITAMIN B COMPLEX PO) Take by mouth  daily.     benazepril  (LOTENSIN ) 40 MG tablet TAKE 1 TABLET BY MOUTH EVERY DAY 90 tablet 2   carbidopa -levodopa  (SINEMET  CR) 50-200 MG tablet Take 1 tablet by mouth 2 (two) times daily.     carbidopa -levodopa  (SINEMET  IR) 25-100 MG tablet Take 2 tablets by mouth 3 (three) times daily.     carbidopa -levodopa -entacapone  (STALEVO ) 50-200-200 MG tablet Take 1 tablet by mouth in the morning and at bedtime. 180 tablet 3   Cyanocobalamin  (VITAMIN B-12) 2500 MCG SUBL Place under the tongue daily.     Omega-3 Fatty Acids (OMEGA 3 PO) Take 1,400 mg by mouth daily.     omeprazole  (PRILOSEC) 20 MG capsule TAKE 1 CAPSULE BY MOUTH EVERY DAY 90 capsule 1   Vitamin D , Ergocalciferol , (DRISDOL ) 1.25 MG (50000 UT) CAPS capsule Take 1 capsule (50,000 Units total) by mouth every 7 (seven) days. 4 capsule 12   No current facility-administered medications for this visit.     Physical Exam    VS:  There were no vitals taken for this visit. , BMI There is no height or weight on file to calculate BMI.       GEN: Well nourished, well developed, in no acute distress. HEENT: normal. Neck: Supple, no JVD, carotid bruits, or masses. Cardiac: RRR, no murmurs, rubs, or gallops. No clubbing, cyanosis, edema.  Radials 2+/PT 2+ and equal bilaterally.  Respiratory:  Respirations regular and unlabored, clear to auscultation bilaterally. GI: Soft, nontender, nondistended, BS + x 4. MS: no deformity or atrophy. Skin: warm and dry, no rash. Neuro:  Strength and sensation are intact. Psych: Normal affect.  Accessory Clinical Findings    ECG personally reviewed by me today -    *** - no acute changes.  Lab Results  Component Value Date   WBC 4.5 03/11/2024   HGB 12.0 (L) 03/11/2024   HCT 36.5 (L) 03/11/2024   MCV 93.6 03/11/2024   PLT 161 03/11/2024   Lab Results  Component Value Date   CREATININE 0.49 (L) 03/11/2024   BUN 14 03/11/2024   NA 137 03/11/2024   K 3.8 03/11/2024   CL 104 03/11/2024   CO2 26  03/11/2024   Lab Results  Component Value Date   ALT 6 03/11/2024   AST 19 03/11/2024   ALKPHOS 50 03/11/2024   BILITOT 1.2 03/11/2024   Lab Results  Component Value Date   CHOL 158 08/01/2022   HDL 54 08/01/2022   LDLCALC 89 08/01/2022   TRIG 78 08/01/2022   CHOLHDL 2.9 08/01/2022    Lab Results  Component Value Date   HGBA1C 5.5 02/15/2023   Lab Results  Component Value Date   TSH 0.493 08/01/2022       Assessment & Plan    1.  ***  Laneta Pintos, NP 04/17/2024, 10:31 AM

## 2024-04-30 ENCOUNTER — Emergency Department

## 2024-04-30 ENCOUNTER — Other Ambulatory Visit: Payer: Self-pay

## 2024-04-30 ENCOUNTER — Emergency Department
Admission: EM | Admit: 2024-04-30 | Discharge: 2024-04-30 | Disposition: A | Discharge status: Home / Self Care | Attending: Emergency Medicine | Admitting: Emergency Medicine

## 2024-04-30 DIAGNOSIS — Z23 Encounter for immunization: Secondary | ICD-10-CM | POA: Diagnosis not present

## 2024-04-30 DIAGNOSIS — W19XXXA Unspecified fall, initial encounter: Secondary | ICD-10-CM | POA: Diagnosis not present

## 2024-04-30 DIAGNOSIS — S0101XA Laceration without foreign body of scalp, initial encounter: Secondary | ICD-10-CM | POA: Diagnosis not present

## 2024-04-30 DIAGNOSIS — Y92009 Unspecified place in unspecified non-institutional (private) residence as the place of occurrence of the external cause: Secondary | ICD-10-CM | POA: Insufficient documentation

## 2024-04-30 DIAGNOSIS — G20A1 Parkinson's disease without dyskinesia, without mention of fluctuations: Secondary | ICD-10-CM | POA: Insufficient documentation

## 2024-04-30 DIAGNOSIS — R7309 Other abnormal glucose: Secondary | ICD-10-CM | POA: Diagnosis not present

## 2024-04-30 DIAGNOSIS — S0990XA Unspecified injury of head, initial encounter: Secondary | ICD-10-CM | POA: Diagnosis present

## 2024-04-30 LAB — CBC WITH DIFFERENTIAL/PLATELET
Abs Immature Granulocytes: 0.01 10*3/uL (ref 0.00–0.07)
Basophils Absolute: 0 10*3/uL (ref 0.0–0.1)
Basophils Relative: 0 %
Eosinophils Absolute: 0 10*3/uL (ref 0.0–0.5)
Eosinophils Relative: 1 %
HCT: 40.8 % (ref 39.0–52.0)
Hemoglobin: 13.4 g/dL (ref 13.0–17.0)
Immature Granulocytes: 0 %
Lymphocytes Relative: 19 %
Lymphs Abs: 0.9 10*3/uL (ref 0.7–4.0)
MCH: 30.4 pg (ref 26.0–34.0)
MCHC: 32.8 g/dL (ref 30.0–36.0)
MCV: 92.5 fL (ref 80.0–100.0)
Monocytes Absolute: 0.4 10*3/uL (ref 0.1–1.0)
Monocytes Relative: 9 %
Neutro Abs: 3.7 10*3/uL (ref 1.7–7.7)
Neutrophils Relative %: 71 %
Platelets: 193 10*3/uL (ref 150–400)
RBC: 4.41 MIL/uL (ref 4.22–5.81)
RDW: 12.6 % (ref 11.5–15.5)
WBC: 5.1 10*3/uL (ref 4.0–10.5)
nRBC: 0 % (ref 0.0–0.2)

## 2024-04-30 LAB — BASIC METABOLIC PANEL WITH GFR
Anion gap: 10 (ref 5–15)
BUN: 20 mg/dL (ref 8–23)
CO2: 24 mmol/L (ref 22–32)
Calcium: 8.9 mg/dL (ref 8.9–10.3)
Chloride: 104 mmol/L (ref 98–111)
Creatinine, Ser: 0.55 mg/dL — ABNORMAL LOW (ref 0.61–1.24)
GFR, Estimated: >60 mL/min (ref >60)
Glucose, Bld: 69 mg/dL — ABNORMAL LOW (ref 70–99)
Potassium: 3.6 mmol/L (ref 3.5–5.1)
Sodium: 138 mmol/L (ref 135–145)

## 2024-04-30 LAB — CBG MONITORING, ED: Glucose-Capillary: 118 mg/dL — ABNORMAL HIGH (ref 70–99)

## 2024-04-30 MED ORDER — TETANUS-DIPHTH-ACELL PERTUSSIS 5-2.5-18.5 LF-MCG/0.5 IM SUSY
0.5000 mL | PREFILLED_SYRINGE | Freq: Once | INTRAMUSCULAR | Status: AC
  Administered 2024-04-30: 0.5 mL via INTRAMUSCULAR
  Filled 2024-04-30: qty 0.5

## 2024-04-30 NOTE — ED Provider Notes (Signed)
 [facility] Provider Note    Event Date/Time   First MD Initiated Contact with Patient 04/30/24 2100     (approximate)   History   Fall (Per EMS, pt had a fall at home.  No LOC, but lac/hematoma to the back of the head.  EMS sts that pt's initial BP 70s/40s and O2 sat in low 90's so they placed him on supp O2 at 4lpm [place].  Pt is confused at baseline and has a hx of parkinsons)   HPI [name] [name]. is a 78 y.o. male with advanced Parkinson's presenting today for fall.  Patient reported had a fall at home witnessed by wife.  Hit the back of his head with small cut and bleeding.  Reportedly no loss of consciousness.  Patient currently denying any pain symptoms at this time.  Wife states he is at his normal mental status baseline.  With EMS, reportedly had low blood pressure at 70s over 40s but no tachycardia.  Patient has a history of autonomic instability secondary to his Parkinson's.     Physical Exam   Triage Vital Signs: ED Triage Vitals  Encounter Vitals Group     BP 04/30/24 2132 139/80     Systolic BP Percentile --      Diastolic BP Percentile --      Pulse Rate 04/30/24 2132 60     Resp --      Temp 04/30/24 2132 98.2 F (36.8 C)     Temp Source 04/30/24 2132 Oral     SpO2 04/30/24 2132 100 %     Weight 04/30/24 2135 150 lb (68 kg)     Height 04/30/24 2135 5\' 8"  (1.727 m)     Head Circumference --      Peak Flow --      Pain Score 04/30/24 2134 0     Pain Loc --      Pain Education --      Exclude from Growth Chart --     Most recent vital signs: Vitals:   04/30/24 2132 04/30/24 2200  BP: 139/80 106/60  Pulse: 60 (!) 58  Resp:  12  Temp: 98.2 F (36.8 C)   SpO2: 100% 98%   I have reviewed the vital signs. General:  Awake, alert, no acute distress. Head:  Normocephalic, 2.5 cm laceration to the left occiput region of the scalp. EENT:  PERRL, EOMI, Oral mucosa pink and moist, Neck is supple. Cardiovascular: Regular rate, 2+ distal  pulses. Respiratory:  Normal respiratory effort, symmetrical expansion, no distress.   Extremities:  Moving all four extremities through full ROM without pain.   Neuro:  Alert and oriented.  Interacting appropriately.   Skin:  Warm, dry, no rash.   Psych: Appropriate affect.    ED Results / Procedures / Treatments   Labs (all labs ordered are listed, but only abnormal results are displayed) Labs Reviewed  BASIC METABOLIC PANEL WITH GFR - Abnormal; Notable for the following components:      Result Value   Glucose, Bld 69 (*)    Creatinine, Ser 0.55 (*)    All other components within normal limits  CBC WITH DIFFERENTIAL/PLATELET     EKG My EKG interpretation: Rate of 61, normal sinus rhythm.  Right bundle branch block.  Normal intervals.  No acute ST elevations or depressions   RADIOLOGY Independently interpreted CT head and C-spine with no acute traumatic pathology   PROCEDURES:  Critical Care performed: No  .  Laceration Repair  Date/Time: 04/30/2024 10:56 PM  Performed by: Kandee Orion, MD Authorized by: Kandee Orion, MD   Consent:    Consent obtained:  Verbal   Consent given by:  Spouse   Risks, benefits, and alternatives were discussed: yes     Risks discussed:  Infection, pain, poor cosmetic result and poor wound healing Universal protocol:    Patient identity confirmed:  Arm band Anesthesia:    Anesthesia method:  None Laceration details:    Location:  Scalp   Scalp location:  Occipital   Length (cm):  3   Depth (mm):  2 Pre-procedure details:    Preparation:  Patient was prepped and draped in usual sterile fashion and imaging obtained to evaluate for foreign bodies Exploration:    Imaging outcome: foreign body not noted   Skin repair:    Repair method:  Staples   Number of staples:  2 Approximation:    Approximation:  Close Repair type:    Repair type:  Simple Post-procedure details:    Dressing:  Open (no dressing)   Procedure completion:   Tolerated well, no immediate complications    MEDICATIONS ORDERED IN ED: Medications  Tdap (BOOSTRIX) injection 0.5 mL (has no administration in time range)     IMPRESSION / MDM / ASSESSMENT AND PLAN / ED COURSE  I reviewed the triage vital signs and the nursing notes.                              Differential diagnosis includes, but is not limited to, ICH, scalp laceration, cervical spine injury, dehydration, electrolyte abnormality, autonomic instability  Patient's presentation is most consistent with acute presentation with potential threat to life or bodily function.  Patient is a 78 year old male with history of advanced Parkinson's and autonomic instability presenting today for fall with head injury.  On arrival vital signs are stable with no significant hypotension.  Patient asymptomatic.  Does have a laceration to the left occipital scalp region.  Will get CT imaging of head and C-spine to rule out any traumatic pathology.  EKG otherwise consistent with baseline.  CBC and BMP unremarkable aside from slight hypoglycemia at 69. Will give juice.  CT head and C-spine show no acute traumatic pathology.  Laceration was repaired with 2 staples. Tetanus shot updated today. Wife otherwise feels comfortable taking him home given his history of orthostatic hypotension secondary to his Parkinson's and now he is currently asymptomatic.  Told to follow-up with PCP and given strict return precautions.  The patient is on the cardiac monitor to evaluate for evidence of arrhythmia and/or significant heart rate changes.     FINAL CLINICAL IMPRESSION(S) / ED DIAGNOSES   Final diagnoses:  Fall, initial encounter  Laceration of scalp, initial encounter     Rx / DC Orders   ED Discharge Orders     None        Note:  This document was prepared using Dragon voice recognition software and may include unintentional dictation errors.   Kandee Orion, MD 04/30/24 [id number]

## 2024-04-30 NOTE — ED Triage Notes (Signed)
 Fall at home with head injury, no LOC.  Pt hypotensive for EMS 70s/40s initially.  Was given 1200ml IVF, last BP 109/70.  Pt AAOx1, has no complaints.

## 2024-04-30 NOTE — Discharge Instructions (Signed)
 The staples will need to come out in 7 to 10 days.  This can be done with your primary care provider, urgent care, or emergency department.  Otherwise follow-up with your regular provider in the next couple of days.

## 2024-05-01 ENCOUNTER — Emergency Department
Admission: EM | Admit: 2024-05-01 | Discharge: 2024-05-01 | Disposition: A | Discharge status: Home / Self Care | Attending: Emergency Medicine | Admitting: Emergency Medicine

## 2024-05-01 ENCOUNTER — Other Ambulatory Visit: Payer: Self-pay

## 2024-05-01 ENCOUNTER — Emergency Department

## 2024-05-01 DIAGNOSIS — G20A1 Parkinson's disease without dyskinesia, without mention of fluctuations: Secondary | ICD-10-CM | POA: Diagnosis not present

## 2024-05-01 DIAGNOSIS — W19XXXA Unspecified fall, initial encounter: Secondary | ICD-10-CM | POA: Diagnosis not present

## 2024-05-01 DIAGNOSIS — R4189 Other symptoms and signs involving cognitive functions and awareness: Secondary | ICD-10-CM

## 2024-05-01 DIAGNOSIS — E860 Dehydration: Secondary | ICD-10-CM | POA: Insufficient documentation

## 2024-05-01 DIAGNOSIS — R4182 Altered mental status, unspecified: Secondary | ICD-10-CM | POA: Diagnosis present

## 2024-05-01 LAB — COMPREHENSIVE METABOLIC PANEL WITH GFR
ALT: 5 U/L (ref 0–44)
AST: 16 U/L (ref 15–41)
Albumin: 3.3 g/dL — ABNORMAL LOW (ref 3.5–5.0)
Alkaline Phosphatase: 41 U/L (ref 38–126)
Anion gap: 7 (ref 5–15)
BUN: 12 mg/dL (ref 8–23)
CO2: 25 mmol/L (ref 22–32)
Calcium: 8.4 mg/dL — ABNORMAL LOW (ref 8.9–10.3)
Chloride: 104 mmol/L (ref 98–111)
Creatinine, Ser: 0.42 mg/dL — ABNORMAL LOW (ref 0.61–1.24)
GFR, Estimated: >60 mL/min (ref >60)
Glucose, Bld: 88 mg/dL (ref 70–99)
Potassium: 4 mmol/L (ref 3.5–5.1)
Sodium: 136 mmol/L (ref 135–145)
Total Bilirubin: 1.1 mg/dL (ref 0.0–1.2)
Total Protein: 5.9 g/dL — ABNORMAL LOW (ref 6.5–8.1)

## 2024-05-01 LAB — URINALYSIS, ROUTINE W REFLEX MICROSCOPIC
Bilirubin Urine: NEGATIVE
Glucose, UA: NEGATIVE mg/dL
Hgb urine dipstick: NEGATIVE
Ketones, ur: 5 mg/dL — AB
Leukocytes,Ua: NEGATIVE
Nitrite: NEGATIVE
Protein, ur: NEGATIVE mg/dL
Specific Gravity, Urine: 1.017 (ref 1.005–1.030)
pH: 5 (ref 5.0–8.0)

## 2024-05-01 LAB — CBC WITH DIFFERENTIAL/PLATELET
Abs Immature Granulocytes: 0.01 10*3/uL (ref 0.00–0.07)
Basophils Absolute: 0 10*3/uL (ref 0.0–0.1)
Basophils Relative: 0 %
Eosinophils Absolute: 0 10*3/uL (ref 0.0–0.5)
Eosinophils Relative: 1 %
HCT: 35.3 % — ABNORMAL LOW (ref 39.0–52.0)
Hemoglobin: 11.6 g/dL — ABNORMAL LOW (ref 13.0–17.0)
Immature Granulocytes: 0 %
Lymphocytes Relative: 16 %
Lymphs Abs: 0.8 10*3/uL (ref 0.7–4.0)
MCH: 30.7 pg (ref 26.0–34.0)
MCHC: 32.9 g/dL (ref 30.0–36.0)
MCV: 93.4 fL (ref 80.0–100.0)
Monocytes Absolute: 0.5 10*3/uL (ref 0.1–1.0)
Monocytes Relative: 10 %
Neutro Abs: 3.7 10*3/uL (ref 1.7–7.7)
Neutrophils Relative %: 73 %
Platelets: 159 10*3/uL (ref 150–400)
RBC: 3.78 MIL/uL — ABNORMAL LOW (ref 4.22–5.81)
RDW: 12.9 % (ref 11.5–15.5)
WBC: 5.1 10*3/uL (ref 4.0–10.5)
nRBC: 0 % (ref 0.0–0.2)

## 2024-05-01 LAB — MAGNESIUM: Magnesium: 1.9 mg/dL (ref 1.7–2.4)

## 2024-05-01 MED ORDER — LACTATED RINGERS IV BOLUS
1000.0000 mL | Freq: Once | INTRAVENOUS | Status: AC
  Administered 2024-05-01: 1000 mL via INTRAVENOUS

## 2024-05-01 NOTE — ED Provider Notes (Signed)
 [facility] Provider Note    Event Date/Time   First MD Initiated Contact with Patient 05/01/24 1109     (approximate)   History   Altered Mental Status   HPI  [name] Dias. is a 78 y.o. male who presents to the ED for evaluation of Altered Mental Status   I review ED visit from yesterday and PCP visit from 12/6.  Seen yesterday for a fall, reassuring imaging, scalp laceration repaired with staples.  Discharged home about 12 hours ago. Otherwise he has a history of severe Parkinson disease and lives at home with his wife, poor mobility, insomnia, urinary incontinence, nonverbal.  DNR in place.  Patient presents to the ED from home by EMS for evaluation of decreased responsiveness.  Patient's wife was out of the house playing pickle ball and they had a caregiver at home who noted that patient was generally weaker and less responsive than normal so she called 911.   Physical Exam   Triage Vital Signs: ED Triage Vitals  Encounter Vitals Group     BP      Systolic BP Percentile      Diastolic BP Percentile      Pulse      Resp      Temp      Temp src      SpO2      Weight      Height      Head Circumference      Peak Flow      Pain Score      Pain Loc      Pain Education      Exclude from Growth Chart     Most recent vital signs: Vitals:   05/01/24 1330 05/01/24 1400  BP: (!) 113/59 (!) 109/58  Pulse: (!) 49 (!) 47  Resp: 11 13  Temp:    SpO2: 95% 96%    General: Awake, no distress.  Appears dry, minimally responsive.  No signs of trauma or focal deficits or seizure activity. CV:  Good peripheral perfusion.  Resp:  Normal effort.  Abd:  No distention.  MSK:  No deformity noted.  Neuro:  No focal deficits appreciated. Other:     ED Results / Procedures / Treatments   Labs (all labs ordered are listed, but only abnormal results are displayed) Labs Reviewed  URINALYSIS, ROUTINE W REFLEX MICROSCOPIC - Abnormal; Notable for the  following components:      Result Value   Color, Urine YELLOW (*)    APPearance CLEAR (*)    Ketones, ur 5 (*)    All other components within normal limits  COMPREHENSIVE METABOLIC PANEL WITH GFR - Abnormal; Notable for the following components:   Creatinine, Ser 0.42 (*)    Calcium  8.4 (*)    Total Protein 5.9 (*)    Albumin 3.3 (*)    All other components within normal limits  CBC WITH DIFFERENTIAL/PLATELET - Abnormal; Notable for the following components:   RBC 3.78 (*)    Hemoglobin 11.6 (*)    HCT 35.3 (*)    All other components within normal limits  MAGNESIUM    EKG Sinus bradycardia with a rate of 46 bpm, right bundle, no STEMI  RADIOLOGY CXR interpreted by me without evidence of acute cardiopulmonary pathology. CT head and neck from overnight interpreted by me without signs of ICH or cervical fracture  Official radiology report(s): DG Chest Portable 1 View Result Date: 05/01/2024 CLINICAL  DATA:  Hypotension, bradycardia, altered mental status. EXAM: PORTABLE CHEST 1 VIEW COMPARISON:  March 11, 2024. FINDINGS: The heart size and mediastinal contours are within normal limits. Both lungs are clear. The visualized skeletal structures are unremarkable. IMPRESSION: No active disease. Electronically Signed   By: Rosalene Colon M.D.   On: 05/01/2024 11:32   CT Head Wo Contrast Result Date: 04/30/2024 CLINICAL DATA:  Trauma. EXAM: CT HEAD WITHOUT CONTRAST CT CERVICAL SPINE WITHOUT CONTRAST TECHNIQUE: Multidetector CT imaging of the head and cervical spine was performed following the standard protocol without intravenous contrast. Multiplanar CT image reconstructions of the cervical spine were also generated. RADIATION DOSE REDUCTION: This exam was performed according to the departmental dose-optimization program which includes automated exposure control, adjustment of the mA and/or kV according to patient size and/or use of iterative reconstruction technique. COMPARISON:  None  Available. FINDINGS: CT HEAD FINDINGS Brain: There is periventricular white matter decreased attenuation consistent with small vessel ischemic changes. Ventricles, sulci and cisterns are prominent consistent with age related involutional changes. No acute intracranial hemorrhage, mass effect or shift. No hydrocephalus. Vascular: No hyperdense vessel or unexpected calcification. Skull: Normal. Negative for fracture or focal lesion. Sinuses/Orbits: No acute finding. CT CERVICAL SPINE FINDINGS Alignment: Normal. Skull base and vertebrae: No acute fracture. No primary bone lesion or focal pathologic process. Soft tissues and spinal canal: No prevertebral fluid or swelling. No visible canal hematoma. Disc levels: Disc space narrowing with marginal osteophytes consistent with degenerative disc disease C4 through T1. Osteoarthritis at C1-C2. Upper chest: Negative. IMPRESSION: 1. Atrophy and chronic small vessel ischemic changes. 2. No acute intracranial process identified. 3. Degenerative changes of the cervical spine. 4. No acute traumatic abnormalities. Electronically Signed   By: Sydell Eva M.D.   On: 04/30/2024 22:29   CT Cervical Spine Wo Contrast Result Date: 04/30/2024 CLINICAL DATA:  Trauma. EXAM: CT HEAD WITHOUT CONTRAST CT CERVICAL SPINE WITHOUT CONTRAST TECHNIQUE: Multidetector CT imaging of the head and cervical spine was performed following the standard protocol without intravenous contrast. Multiplanar CT image reconstructions of the cervical spine were also generated. RADIATION DOSE REDUCTION: This exam was performed according to the departmental dose-optimization program which includes automated exposure control, adjustment of the mA and/or kV according to patient size and/or use of iterative reconstruction technique. COMPARISON:  None Available. FINDINGS: CT HEAD FINDINGS Brain: There is periventricular white matter decreased attenuation consistent with small vessel ischemic changes. Ventricles,  sulci and cisterns are prominent consistent with age related involutional changes. No acute intracranial hemorrhage, mass effect or shift. No hydrocephalus. Vascular: No hyperdense vessel or unexpected calcification. Skull: Normal. Negative for fracture or focal lesion. Sinuses/Orbits: No acute finding. CT CERVICAL SPINE FINDINGS Alignment: Normal. Skull base and vertebrae: No acute fracture. No primary bone lesion or focal pathologic process. Soft tissues and spinal canal: No prevertebral fluid or swelling. No visible canal hematoma. Disc levels: Disc space narrowing with marginal osteophytes consistent with degenerative disc disease C4 through T1. Osteoarthritis at C1-C2. Upper chest: Negative. IMPRESSION: 1. Atrophy and chronic small vessel ischemic changes. 2. No acute intracranial process identified. 3. Degenerative changes of the cervical spine. 4. No acute traumatic abnormalities. Electronically Signed   By: Sydell Eva M.D.   On: 04/30/2024 22:29    PROCEDURES and INTERVENTIONS:  .1-3 Lead EKG Interpretation  Performed by: Arline Bennett, MD Authorized by: Arline Bennett, MD     Interpretation: abnormal     ECG rate:  52   ECG rate assessment: bradycardic  Rhythm: sinus bradycardia     Ectopy: none     Conduction: normal     Medications  lactated ringers  bolus 1,000 mL (0 mLs Intravenous Stopped 05/01/24 1320)     IMPRESSION / MDM / ASSESSMENT AND PLAN / ED COURSE  I reviewed the triage vital signs and the nursing notes.  Differential diagnosis includes, but is not limited to, cardiogenic shock, dehydration, sepsis, renal failure  {Patient presents with symptoms of an acute illness or injury that is potentially life-threatening.  Patient presents to the ED with lesser responsiveness, possibly due to relative dehydration and ultimately suitable for outpatient management with family.  Seen overnight for a fall and a small head laceration with a couple staples and negative  imaging.  Here he has a nonfocal exam with decreased responsiveness initially, significantly improving with IV fluid bolus.  Improving back to baseline.  Urine has small ketones suggestive of dehydration but otherwise no infectious features.  Otherwise normal CBC, metabolic panel and CXR.  As below, many conversations with family.  They do not want him placed anywhere in therefore do not want him in the hospital, but have some discomfort with him going home.  They will have family and neighbors come help with the patient and wife will reach out to hospice organization to see if they can be upgraded to an inpatient or hospice house situation.  Discussed close ED return precautions  Clinical Course as of 05/01/24 1516  Fri May 01, 2024  1417 Reassessed.  Patient much more responsive, responding to his name, opening eyes and looking better than initial presentation.  His wife is returned to the bedside and we discussed plan of care.  She is asking for an overnight observation admission so she can take a break. [DS]  1449 Multiple conversations with the wife over the past hour or so.  She has difficulty deciding what she would want to do with her husband.  She reports that she does not want him placed in rehab facility or SNF, but is uncomfortable taking him home and does not want him in the hospital.  I return to the room on 3 separate occasions to reinforce options to his wife.  She does find neighbors and friends that can help move the patient and she is comfortable taking him home this afternoon at 5:15 PM once the help can arrive.  She is curious about hospice house/inpatient hospice but would not want any other sort of placement.  I did reach out to our Child psychotherapist.  Wife is going home to call the hospice agency. [DS]    Clinical Course User Index [DS] Arline Bennett, MD     FINAL CLINICAL IMPRESSION(S) / ED DIAGNOSES   Final diagnoses:  Decreased responsiveness  Dehydration     Rx / DC  Orders   ED Discharge Orders     None        Note:  This document was prepared using Dragon voice recognition software and may include unintentional dictation errors.   Arline Bennett, MD 05/01/24 [phone]

## 2024-05-01 NOTE — Progress Notes (Signed)
   05/01/24 1545  Spiritual Encounters  Type of Visit Initial  Care provided to: Patient;Friend  Conversation partners present during encounter Nurse (Patient asked chaplain to ask nurse to scoot him up in the bed and to lower the bed rail as it was frustrating him. Chaplain did speak to nurse about the first and then told patient they had the bed rail up for his protection and he said he understood.)  Referral source Chaplain assessment  Reason for visit Routine spiritual support  OnCall Visit No  Interventions  Spiritual Care Interventions Made Established relationship of care and support;Compassionate presence;Reflective listening  Intervention Outcomes  Outcomes Connection to spiritual care;Awareness around self/spiritual resourses  Spiritual Care Plan  Spiritual Care Issues Still Outstanding No further spiritual care needs at this time (see row info)

## 2024-05-01 NOTE — ED Notes (Signed)
 Bear hugger taken off due to stable temp.

## 2024-05-01 NOTE — ED Notes (Addendum)
 Pt ambulated to the wheelchair with difficulty. @ person assist. Pt hard to get to follow commands.

## 2024-05-01 NOTE — Discharge Instructions (Signed)
 As we discussed, I would reach out to your hospice organization to discuss hospice house or inpatient hospice for your husband.  Return to the ED with any worsening symptoms or concerns

## 2024-05-01 NOTE — ED Triage Notes (Signed)
 pt in via [facility] from home. Non-verbal at baseline. Had a fall yesterday, more weakness today than normal. Per caregiver he is out of his norm. Does not follow commands. Pt has a DNR. Hx of parkinsons. Staples in the back of his head from fall yesterday. Hx of MI.  EMS vitals; Sinus rhythm at 43 Rr-14 Bp-67/47--90/50 after fluids.

## 2024-05-01 NOTE — ED Notes (Signed)
 Brief noted to be dry at this time.

## 2024-05-01 NOTE — ED Notes (Signed)
 Family at bedside. Pt boosted in the bed. Pillow given. Pt denies any needs.

## 2024-05-13 ENCOUNTER — Other Ambulatory Visit: Payer: Self-pay | Admitting: Cardiovascular Disease

## 2024-07-06 ENCOUNTER — Other Ambulatory Visit: Payer: Self-pay | Admitting: Cardiovascular Disease

## 2024-10-26 ENCOUNTER — Other Ambulatory Visit: Payer: Self-pay | Admitting: Cardiovascular Disease

## 2024-11-27 ENCOUNTER — Other Ambulatory Visit: Payer: Self-pay | Admitting: Cardiovascular Disease

## 2025-01-03 ENCOUNTER — Other Ambulatory Visit: Payer: Self-pay | Admitting: Cardiovascular Disease

## 2025-01-07 NOTE — Telephone Encounter (Signed)
 No Lipids since 08/01/22 overdue F/U  In accordance with refill protocols, please review and address the following requirements before this medication refill can be authorized:  Labs

## 2025-01-13 NOTE — Telephone Encounter (Signed)
 Called patient. Made him an appointment in April to see Dr. Gollan. Patient will get lab work done at that appointment.

## 2025-03-22 ENCOUNTER — Ambulatory Visit: Admitting: Cardiovascular Disease
